# Patient Record
Sex: Female | Born: 1935
Health system: Southern US, Community
[De-identification: ages and names within clinical notes are randomized; demographics above are authoritative.]

## PROBLEM LIST (undated history)

## (undated) DIAGNOSIS — E119 Type 2 diabetes mellitus without complications: Secondary | ICD-10-CM

## (undated) DIAGNOSIS — I059 Rheumatic mitral valve disease, unspecified: Secondary | ICD-10-CM

## (undated) DIAGNOSIS — I5022 Chronic systolic (congestive) heart failure: Secondary | ICD-10-CM

## (undated) DIAGNOSIS — I10 Essential (primary) hypertension: Secondary | ICD-10-CM

## (undated) DIAGNOSIS — I214 Non-ST elevation (NSTEMI) myocardial infarction: Secondary | ICD-10-CM

## (undated) DIAGNOSIS — E785 Hyperlipidemia, unspecified: Secondary | ICD-10-CM

## (undated) DIAGNOSIS — I2510 Atherosclerotic heart disease of native coronary artery without angina pectoris: Secondary | ICD-10-CM

## (undated) HISTORY — DX: Essential (primary) hypertension: I10

## (undated) HISTORY — DX: Rheumatic mitral valve disease, unspecified: I05.9

## (undated) HISTORY — PX: MITRAL VALVE REPLACEMENT: SHX147

## (undated) HISTORY — PX: CARDIAC CATHETERIZATION: SHX172

## (undated) HISTORY — DX: Atherosclerotic heart disease of native coronary artery without angina pectoris: I25.10

## (undated) HISTORY — DX: Hyperlipidemia, unspecified: E78.5

## (undated) HISTORY — PX: CORONARY ARTERY BYPASS GRAFT: SHX141

---

## 2003-03-28 ENCOUNTER — Inpatient Hospital Stay (HOSPITAL_COMMUNITY): Admission: AD | Admit: 2003-03-28 | Discharge: 2003-04-09 | Payer: Self-pay | Admitting: *Deleted

## 2003-03-29 ENCOUNTER — Encounter (INDEPENDENT_AMBULATORY_CARE_PROVIDER_SITE_OTHER): Payer: Self-pay | Admitting: Cardiology

## 2003-03-30 ENCOUNTER — Encounter: Payer: Self-pay | Admitting: Cardiothoracic Surgery

## 2003-03-31 ENCOUNTER — Encounter (INDEPENDENT_AMBULATORY_CARE_PROVIDER_SITE_OTHER): Payer: Self-pay | Admitting: *Deleted

## 2003-03-31 ENCOUNTER — Encounter: Payer: Self-pay | Admitting: Cardiothoracic Surgery

## 2003-04-01 ENCOUNTER — Encounter: Payer: Self-pay | Admitting: Cardiothoracic Surgery

## 2003-04-02 ENCOUNTER — Encounter: Payer: Self-pay | Admitting: Cardiothoracic Surgery

## 2003-04-03 ENCOUNTER — Encounter: Payer: Self-pay | Admitting: Thoracic Surgery (Cardiothoracic Vascular Surgery)

## 2003-04-04 ENCOUNTER — Encounter: Payer: Self-pay | Admitting: Thoracic Surgery (Cardiothoracic Vascular Surgery)

## 2003-04-05 ENCOUNTER — Encounter: Payer: Self-pay | Admitting: Cardiothoracic Surgery

## 2003-12-15 ENCOUNTER — Inpatient Hospital Stay (HOSPITAL_COMMUNITY): Admission: EM | Admit: 2003-12-15 | Discharge: 2003-12-29 | Payer: Self-pay | Admitting: Emergency Medicine

## 2003-12-23 ENCOUNTER — Encounter (INDEPENDENT_AMBULATORY_CARE_PROVIDER_SITE_OTHER): Payer: Self-pay | Admitting: *Deleted

## 2004-12-20 IMAGING — US US ABDOMEN COMPLETE
1 series · 14 of 25 positions shown · non-contrast
Comparison: none

CLINICAL DATA: Possible cholecystitis.  
 ABDOMINAL ULTRASOUND 12/23/03 AT 4433 HOURS

[Series 1: unknown · 0.28mm/px · 14 of 58 slices shown]
[im 1/58]
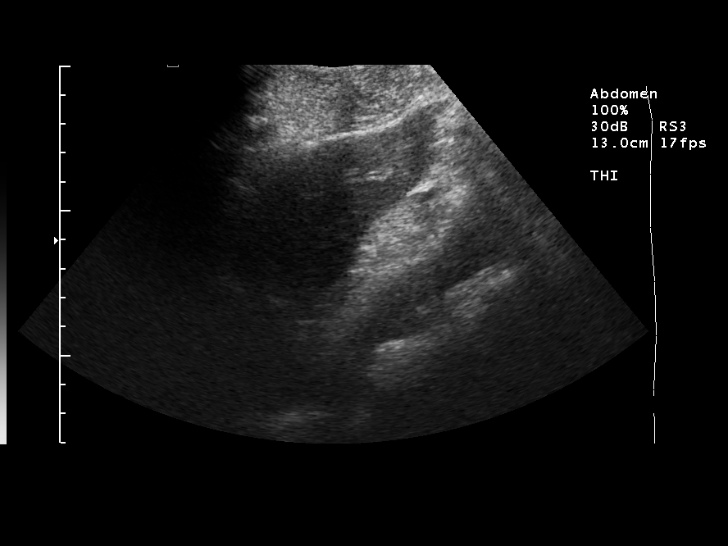
[im 5/58]
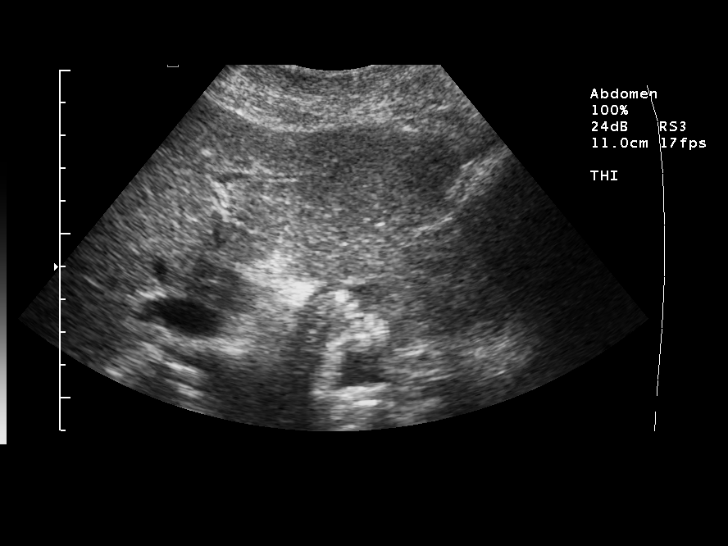
[im 10/58]
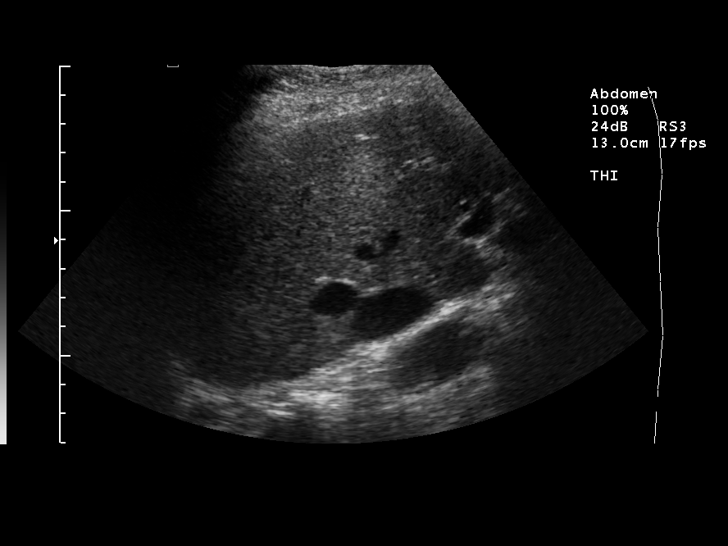
[im 15/58]
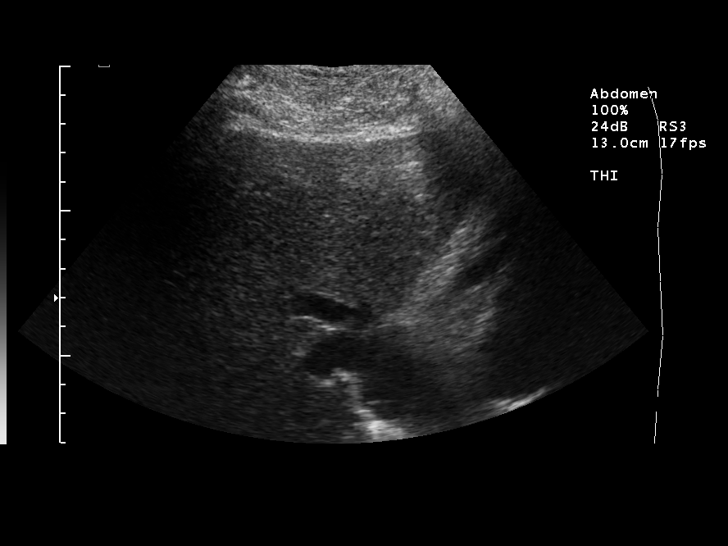
[im 20/58]
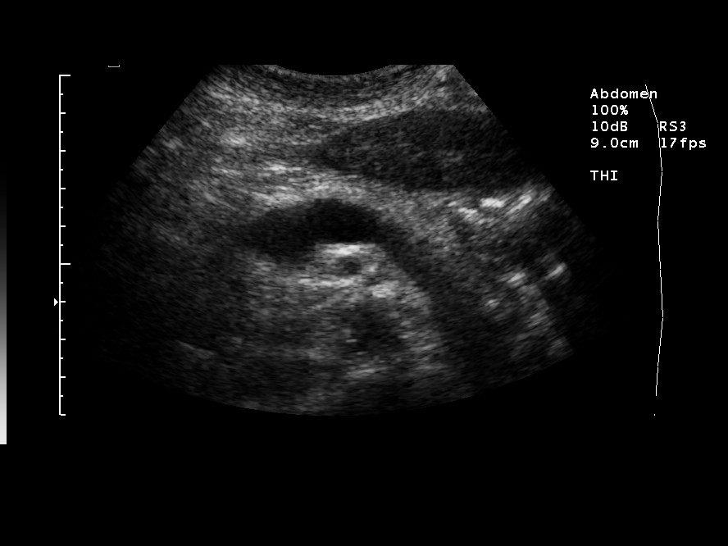
[im 22/58]
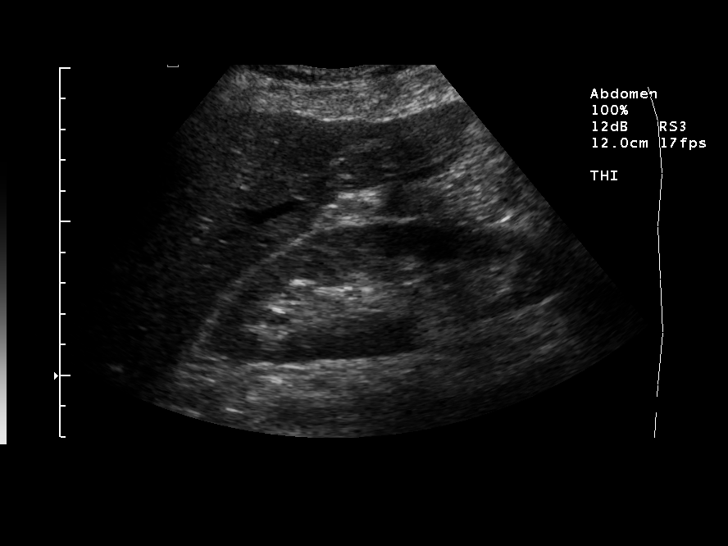
[im 27/58]
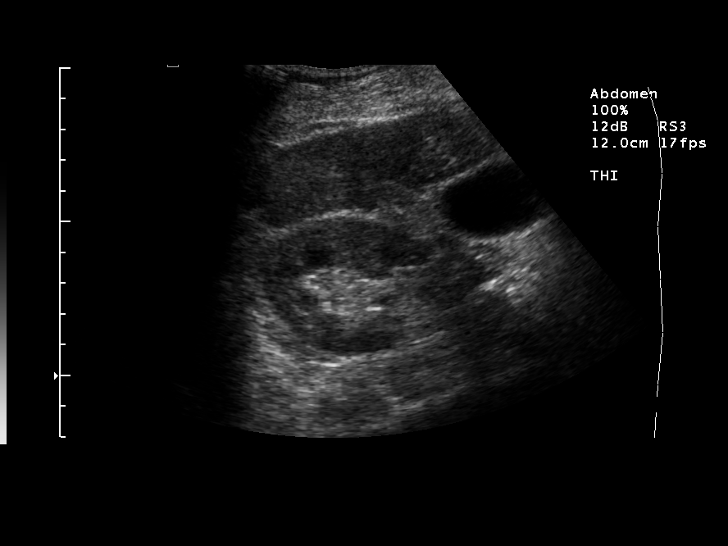
[im 31/58]
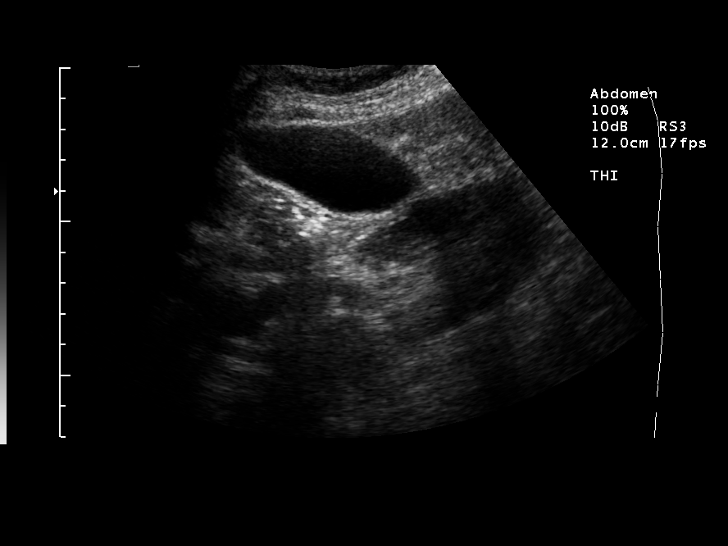
[im 36/58]
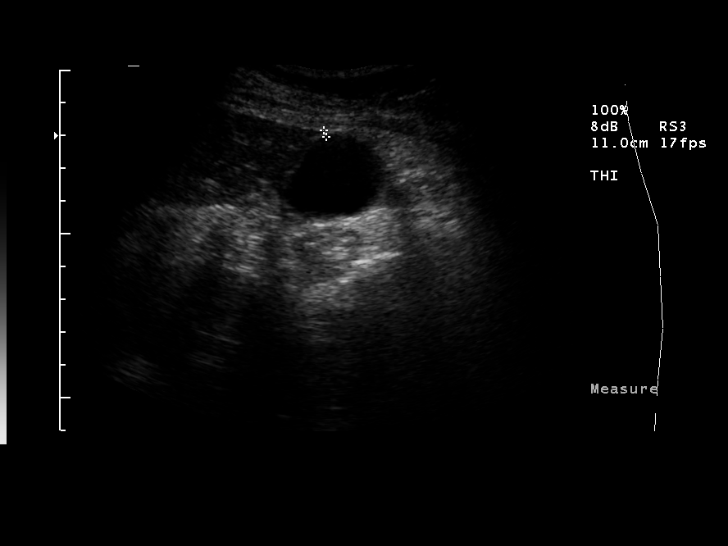
[im 39/58]
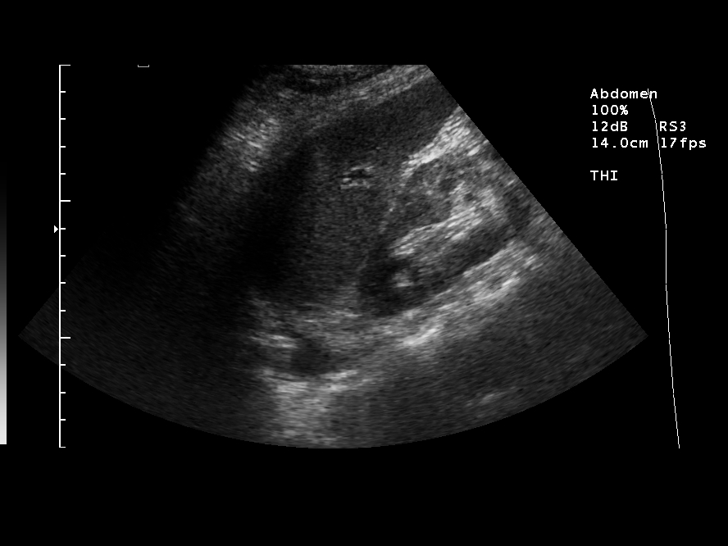
[im 43/58]
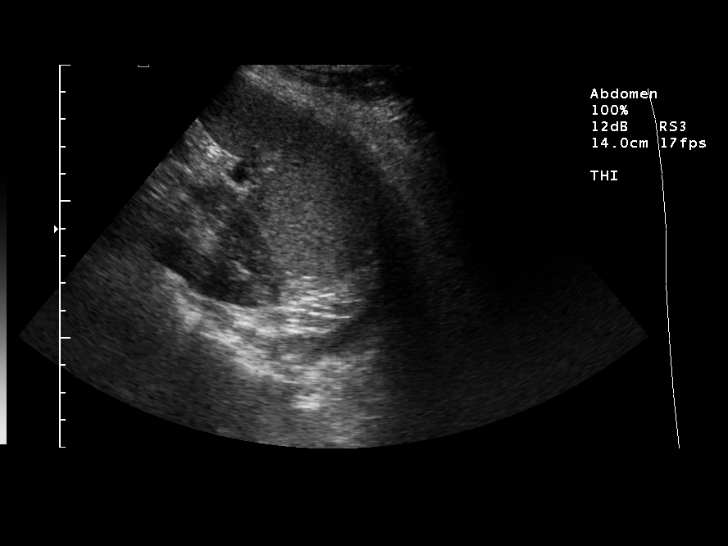
[im 48/58]
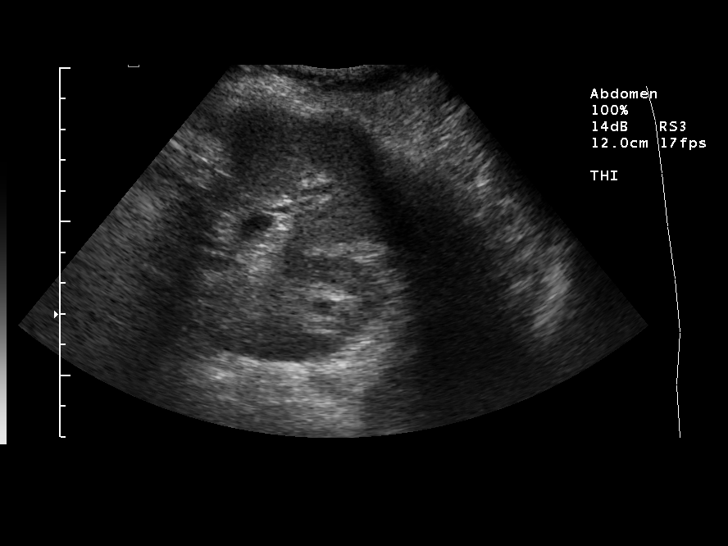
[im 53/58]
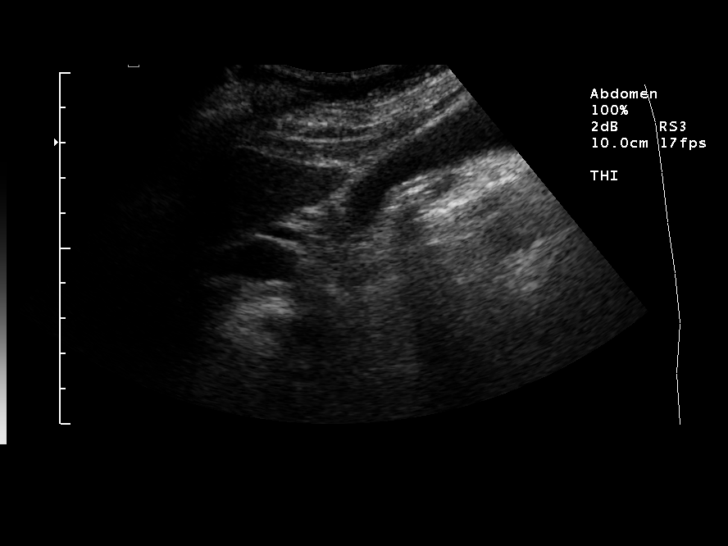
[im 58/58]
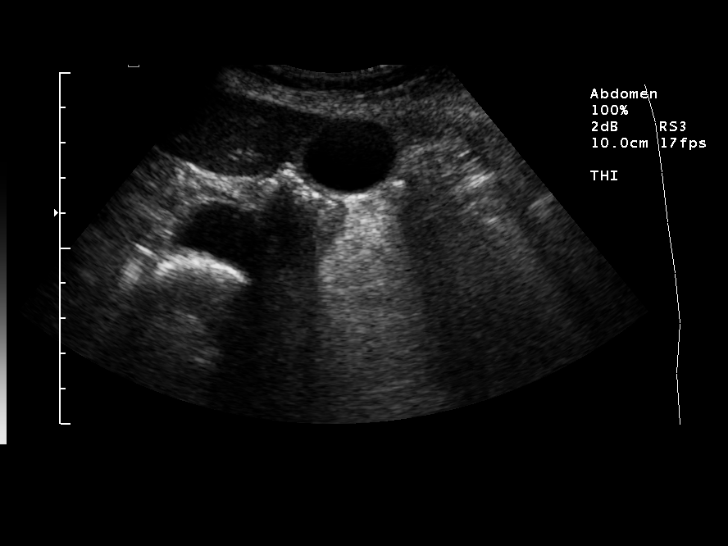

[14 of 25 positions shown; findings below may reference images not displayed]

FINDINGS: The gallbladder is within normal limits without gallstones, wall thickening or pericholecystic fluid.  The common bile duct is 5.0 mm in caliber.  No focal liver masses are seen.  Echogenicity is within normal limits.  
 The IVC is patent.  
 The pancreas is within normal limits. 
 The spleen is 10.0 cm in maximal dimension and is without focal mass effect. 
 The right and left kidneys are 11.3 and 10.7 cm in length respectively without hydronephrosis or mass effect.  
 Maximal aortic caliber is 2.1 cm.  
 Small bilateral effusions are present. 
 IMPRESSION
 No evidence of acute cholecystitis. 
 Bilateral pleural effusions. 
 Otherwise no evidence of acute intraabdominal pathology.

## 2009-09-18 ENCOUNTER — Encounter: Payer: Self-pay | Admitting: Cardiovascular Disease

## 2009-12-04 ENCOUNTER — Encounter: Payer: Self-pay | Admitting: Cardiovascular Disease

## 2009-12-27 ENCOUNTER — Encounter: Payer: Self-pay | Admitting: Cardiovascular Disease

## 2009-12-27 DIAGNOSIS — Z9889 Other specified postprocedural states: Secondary | ICD-10-CM | POA: Insufficient documentation

## 2009-12-29 ENCOUNTER — Encounter: Payer: Self-pay | Admitting: Internal Medicine

## 2009-12-29 ENCOUNTER — Encounter: Payer: Self-pay | Admitting: Cardiovascular Disease

## 2009-12-29 LAB — CONVERTED CEMR LAB: Prothrombin Time: 36.1 s

## 2010-01-03 ENCOUNTER — Encounter: Payer: Self-pay | Admitting: Cardiovascular Disease

## 2010-01-26 ENCOUNTER — Encounter: Payer: Self-pay | Admitting: Cardiovascular Disease

## 2010-01-29 ENCOUNTER — Encounter: Payer: Self-pay | Admitting: Cardiovascular Disease

## 2010-01-29 LAB — CONVERTED CEMR LAB: INR: 4.34

## 2010-02-05 ENCOUNTER — Encounter: Payer: Self-pay | Admitting: Cardiovascular Disease

## 2010-02-06 ENCOUNTER — Encounter: Payer: Self-pay | Admitting: Internal Medicine

## 2010-02-06 LAB — CONVERTED CEMR LAB: Prothrombin Time: 35 s

## 2010-03-08 ENCOUNTER — Encounter: Payer: Self-pay | Admitting: Cardiovascular Disease

## 2010-03-08 ENCOUNTER — Encounter: Payer: Self-pay | Admitting: Cardiology

## 2010-03-21 ENCOUNTER — Telehealth: Payer: Self-pay | Admitting: Cardiovascular Disease

## 2010-03-30 ENCOUNTER — Encounter: Payer: Self-pay | Admitting: Cardiovascular Disease

## 2010-04-05 ENCOUNTER — Encounter: Payer: Self-pay | Admitting: Cardiovascular Disease

## 2010-04-05 ENCOUNTER — Ambulatory Visit: Payer: Self-pay | Admitting: Cardiovascular Disease

## 2010-04-05 DIAGNOSIS — E785 Hyperlipidemia, unspecified: Secondary | ICD-10-CM | POA: Insufficient documentation

## 2010-04-05 DIAGNOSIS — I25810 Atherosclerosis of coronary artery bypass graft(s) without angina pectoris: Secondary | ICD-10-CM | POA: Insufficient documentation

## 2010-04-05 LAB — CONVERTED CEMR LAB: POC INR: 2.1

## 2010-04-06 ENCOUNTER — Encounter: Payer: Self-pay | Admitting: Cardiovascular Disease

## 2010-04-10 ENCOUNTER — Encounter: Payer: Self-pay | Admitting: Cardiovascular Disease

## 2010-05-24 ENCOUNTER — Telehealth: Payer: Self-pay | Admitting: Cardiovascular Disease

## 2010-10-04 ENCOUNTER — Ambulatory Visit: Payer: Self-pay

## 2010-10-30 ENCOUNTER — Other Ambulatory Visit: Payer: Self-pay | Admitting: Cardiovascular Disease

## 2010-11-05 ENCOUNTER — Encounter: Payer: Self-pay | Admitting: Cardiovascular Disease

## 2010-11-20 NOTE — Letter (Signed)
Summary: POA  POA   Imported By: Harlon Flor 01/03/2010 11:23:16  _____________________________________________________________________  External Attachment:    Type:   Image     Comment:   External Document

## 2010-11-20 NOTE — Medication Information (Signed)
Summary: Coumadin Clinic  Anticoagulant Therapy  Managed by: Charlena Cross, RN, BSN Referring MD: Cherlynn Polo MD: Azka Steger MD, Lirio Bach Indication 1: Mechanical Prosthetic Heart Valve (prevent systemic emboli) Lab Used: [facility] Site: [place] PT 36.1 INR RANGE 2.5-3.5  Dietary changes: no    Health status changes: no    Bleeding/hemorrhagic complications: no    Recent/future hospitalizations: no    Any changes in medication regimen? no    Recent/future dental: no  Any missed doses?: no       Is patient compliant with meds? yes       Anticoagulation Management History:      Her anticoagulation is being managed by telephone today.  Positive risk factors for bleeding include an age of 75 years or older.  The bleeding index is 'intermediate risk'.  Positive CHADS2 values include Age > 13 years old.  Today's INR is 3.35.  Prothrombin time is 36.1.  Anticoagulation responsible provider: Shakora Nordquist MD, Reuel Boom.    Anticoagulation Management Assessment/Plan:      The target INR is 2.5-3.5.  The next INR is due 01/26/2010.  Anticoagulation instructions were given to newphew.  Results were reviewed/authorized by Charlena Cross, RN, BSN.  She was notified by Charlena Cross, RN, BSN.         Current Anticoagulation Instructions: The patient is to continue with the same dose of coumadin.  This dosage includes: coumadin 4 mg daily with 4.5 mg on Mon and Fri 35

## 2010-11-20 NOTE — Miscellaneous (Signed)
Summary: inr order  Clinical Lists Changes  Problems: Added new problem of MITRAL VALVE REPLACEMENT, HX OF (ICD-V15.1) Orders: Added new Test order of T-Protime, Auto [phone]) - Signed

## 2010-11-20 NOTE — Assessment & Plan Note (Signed)
Summary: NEW PT   Visit Type:  Initial Consult Primary Provider:  Dr Dorann Lodge  CC:  follow up.  History of Present Illness: Amanda [name] is a very pleasant 77 -year-old woman with a past medical history he does coronary artery disease, bypass surgery in 2004 at [facility] with a mechanical mitral valve also with history of hypertension, hyperlipidemia who presents to reestablish care. She was last seen at [facility] on November 2010.  She reports that overall she is feeling well. She denies any significant shortness of breath or chest discomfort. She uses a cane to ambulate, has had no recent falls though states that her gait is somewhat unstable. She does have a little bit of swelling in her right ankle.  EKG shows normal sinus rhythm with rate 80 beats per minute, nonspecific ST changes in leads V4 through V6.  Preventive Screening-Counseling & Management  Alcohol-Tobacco     Smoking Status: current  Caffeine-Diet-Exercise     Does Patient Exercise: no      Drug Use:  no.    Current Medications (verified): 1)  Vytorin 10-40 Mg Tabs (Ezetimibe-Simvastatin) .... Take One Tablet By Mouth Dailyat Bedtime 2)  Lotrel 5-20 Mg Caps (Amlodipine Besy-Benazepril Hcl) .Marland Kitchen.. 1 Tab Daily 3)  Aspirin 81 Mg Tbec (Aspirin) .... Take One Tablet By Mouth Daily 4)  Metoprolol Succinate 50 Mg Xr24h-Tab (Metoprolol Succinate) .... Take 1 and A Half Tablet By Mouth Daily 5)  Warfarin Sodium 4 Mg Tabs (Warfarin Sodium) .... Use As Directed By Anticoagulation Clinic 6)  Phillips Milk of Magnesia 7.75 % Susp (Magnesium Hydroxide) .... As Needed 7)  Furosemide 20 Mg Tabs (Furosemide) .... Take One Tablet By Mouth Daily As Needed 8)  Loratadine 10 Mg Tabs (Loratadine) .... As Needed 9)  Metformin Hcl 500 Mg Tabs (Metformin Hcl) .... Once Daily  Allergies (verified): 1)  ! Pcn 2)  ! Codeine  Past History:  Past Medical History: Last updated: 01/02/2010 CAD --  bypass 2004 Mitral valve disease -- replacement 2004 hyperlipidemia HTN  Past Surgical History: Last updated: 01/02/2010 bypass -- 2004 mitral valve replacement -- 2004  Family History: Last updated: 04/05/2010 Family History of Coronary Artery Disease:  Family History of CVA or Stroke:  Family History of Diabetes:   Family History: Family History of Coronary Artery Disease:  Family History of CVA or Stroke:  Family History of Diabetes:   Social History: Retired  Widowed  Tobacco Use - Yes.  -- snuff Alcohol Use - no Regular Exercise - no Drug Use - no Smoking Status:  current Does Patient Exercise:  no Drug Use:  no  Review of Systems       The patient complains of difficulty walking.  The patient denies fever, weight loss, weight gain, vision loss, decreased hearing, hoarseness, chest pain, syncope, dyspnea on exertion, peripheral edema, prolonged cough, abdominal pain, incontinence, muscle weakness, depression, and enlarged lymph nodes.    Vital Signs:  Patient profile:   77 year old female Height:      64 inches Weight:      123 pounds BMI:     21.19 Pulse rate:   80 / minute BP sitting:   106 / 68  (left arm) Cuff size:   regular  Vitals Entered By: Hardin Negus, RMA (April 05, 2010 2:41 PM)  Physical Exam  General:  Well developed, well nourished, in no acute distress. Head:  normocephalic and atraumatic Neck:  Neck supple, no JVD.  No masses, thyromegaly or abnormal cervical nodes. Lungs:  Clear bilaterally to auscultation and percussion. Heart:  Non-displaced PMI, chest non-tender; regular rate and rhythm, S1, S2 with II/VI SEM LSB to axilla, no rubs or gallops. Carotid upstroke normal, no bruit.. Pedals normal pulses. No edema, no varicosities. Abdomen:  Bowel sounds positive; abdomen soft and non-tender without masses Msk:  Back normal, normal gait. Muscle strength and tone normal. Pulses:  pulses normal in all 4 extremities Extremities:  No  clubbing or cyanosis. Neurologic:  Alert and oriented x 3. Skin:  Intact without lesions or rashes. Psych:  Normal affect.   Impression & Recommendations:  Problem # 1:  MITRAL VALVE REPLACEMENT, HX OF (ICD-V15.1) mechanical mitral valve placed at the time of her bypass surgery in 2004 at [facility]. No new symptoms of shortness of breath or discomfort. Last echocardiogram was September 2009. At that time her mitral valve was well seated.  We have suggested that she have an echocardiogram done in December with a clinic visit on the same day to follow to save her trip.  Orders: EKG w/ Interpretation (93000) Echocardiogram (Echo)  Problem # 2:  CAD, ARTERY BYPASS GRAFT (ICD-414.04) History of coronary artery disease in 2004. No symptoms of angina at this time. Her cholesterol is well controlled with LDL less than 70.  Her updated medication list for this problem includes:    Lotrel 5-20 Mg Caps (Amlodipine besy-benazepril hcl) .Marland Kitchen... 1 tab daily    Aspirin 81 Mg Tbec (Aspirin) .Marland Kitchen... Take one tablet by mouth daily    Metoprolol Succinate 50 Mg Xr24h-tab (Metoprolol succinate) .Marland Kitchen... Take 1 and a half tablet by mouth daily    Warfarin Sodium 4 Mg Tabs (Warfarin sodium) ..... Use as directed by anticoagulation clinic  Problem # 3:  HYPERLIPIDEMIA-MIXED (ICD-272.4) Given the new restrictions on simvastatin dosing, we will decrease her Vytorin to 10/20 mg daily down from 10/40 mg daily.  Her updated medication list for this problem includes:    Vytorin 10-20 Mg Tabs (Ezetimibe-simvastatin) .Marland Kitchen... Take one tablet by mouth daily at bedtime  Patient Instructions: 1)  Your physician has recommended you make the following change in your medication: STOP Vytorin 10/40  START Vytroin 10/20 2)  Your physician has requested that you have an echocardiogram.  Echocardiography is a painless test that uses sound waves to create images of your heart. It provides your doctor with information about  the size and shape of your heart and how well your heart's chambers and valves are working.  This procedure takes approximately one hour. There are no restrictions for this procedure. 3)  Your physician wants you to follow-up in:  6 months  You will receive a reminder letter in the mail two months in advance. If you don't receive a letter, please call our office to schedule the follow-up appointment. Prescriptions: VYTORIN 10-20 MG TABS (EZETIMIBE-SIMVASTATIN) Take one tablet by mouth daily at bedtime  #30 x 6   Entered by:   Benedict Needy, RN   Authorized by:   Dossie Arbour MD   Signed by:   Benedict Needy, RN on 04/05/2010   Method used:   Print then Give to Patient   RxID:   [id number]   Appended Document: NEW PT Goal INR for her mechanical valve is 2.5-3.5. We have placed a request to see if Dr. Leonor Liv would be able to check her INR as she lives very close to his office and this would save her considerable driving time.

## 2010-11-20 NOTE — Medication Information (Signed)
Summary: Coumadin Clinic  Anticoagulant Therapy  Managed by: [facility] Referring MD: Cherlynn Polo MD: Anup Brigham Indication 1: Mechanical Prosthetic Heart Valve (prevent systemic emboli) Lab Used: [facility] Site: [place] PT 46.7 INR RANGE 2.5-3.5  Dietary changes: no    Health status changes: no    Bleeding/hemorrhagic complications: no    Recent/future hospitalizations: no    Any changes in medication regimen? no    Recent/future dental: no  Any missed doses?: no       Is patient compliant with meds? yes       Anticoagulation Management History:      Her anticoagulation is being managed by telephone today.  Positive risk factors for bleeding include an age of 75 years or older.  The bleeding index is 'intermediate risk'.  Positive CHADS2 values include Age > 58 years old.  Her last INR was 3.35 and today's INR is 4.34.  Prothrombin time is 46.7.  Anticoagulation responsible provider: Marlia Schewe.    Anticoagulation Management Assessment/Plan:      The patient's current anticoagulation dose is Warfarin sodium 2 mg tabs: Use as directed by Anticoagualtion Clinic, Warfarin sodium 2.5 mg tabs: Use as directed by Anticoagualtion Clinic.  The target INR is 2.5-3.5.  The next INR is due 02/12/2010.  Anticoagulation instructions were given to nephew.  Results were reviewed/authorized by [facility].  She was notified by KL.         Prior Anticoagulation Instructions: The patient is to continue with the same dose of coumadin.  This dosage includes: coumadin 4 mg daily with 4.5 mg on Mon and Fri  Current Anticoagulation Instructions: hold 2 days then coumadin 4 mg daily.  recheck in 10 days

## 2010-11-20 NOTE — Progress Notes (Signed)
Summary: REFILL  Phone Note Refill Request Message from:  Patient on May 24, 2010 1:06 PM  Refills Requested: Medication #1:  VYTORIN 10-20 MG TABS Take one tablet by mouth daily at bedtime [facility]- [phone] PHONE NUMBER  Initial call taken by: [place] Carbo,  May 24, 2010 1:07 PM     Appended Document: vytorin    Clinical Lists Changes  Medications: Rx of VYTORIN 10-20 MG TABS (EZETIMIBE-SIMVASTATIN) Take one tablet by mouth daily at bedtime;  #30 x 6;  Signed;  Entered by: Hardin Negus, RMA;  Authorized by: Dossie Arbour MD;  Method used: Electronically to [facility] [address], [place], [place]  [address], Ph: [phone], Fax: [phone]    Prescriptions: VYTORIN 10-20 MG TABS (EZETIMIBE-SIMVASTATIN) Take one tablet by mouth daily at bedtime  #30 x 6   Entered by:   Hardin Negus, RMA   Authorized by:   Dossie Arbour MD   Signed by:   Hardin Negus, RMA on 05/28/2010   Method used:   Electronically to        [facility] (retail)       [address]       [place], [place]  [address]       Ph: [phone]       Fax: [phone]   RxID:   [id number]

## 2010-11-20 NOTE — Medication Information (Signed)
Summary: Coumadin Clinic  Anticoagulant Therapy  Managed by: Charlena Cross, RN, BSN Referring MD: Cherlynn Polo MD: Bensimhon MD, Daniel Indication 1: Mechanical Prosthetic Heart Valve (prevent systemic emboli) Lab Used: [facility] Site: [place] PT 35.0 INR RANGE 2.5-3.5  Dietary changes: no    Health status changes: no    Bleeding/hemorrhagic complications: no    Recent/future hospitalizations: no    Any changes in medication regimen? no    Recent/future dental: no  Any missed doses?: no       Is patient compliant with meds? yes       Anticoagulation Management History:      Her anticoagulation is being managed by telephone today.  Positive risk factors for bleeding include an age of 75 years or older.  The bleeding index is 'intermediate risk'.  Positive CHADS2 values include Age > 71 years old.  Her last INR was 4.34 and today's INR is 3.25.  Prothrombin time is 35.0.  Anticoagulation responsible provider: Bensimhon MD, Reuel Boom.    Anticoagulation Management Assessment/Plan:      The patient's current anticoagulation dose is Warfarin sodium 2 mg tabs: Use as directed by Anticoagualtion Clinic, Warfarin sodium 2.5 mg tabs: Use as directed by Anticoagualtion Clinic.  The target INR is 2.5-3.5.  The next INR is due 03/06/2010.  Anticoagulation instructions were given to nephew.  Results were reviewed/authorized by Charlena Cross, RN, BSN.  She was notified by Charlena Cross, RN, BSN.         Prior Anticoagulation Instructions: hold 2 days then coumadin 4 mg daily.  recheck in 10 days  Current Anticoagulation Instructions: The patient is to continue with the same dose of coumadin.  This dosage includes: coumadin 4 mg daily

## 2010-11-20 NOTE — Medication Information (Signed)
Summary: Coumadin Clinic  Anticoagulant Therapy  Managed by: Inactive Referring MD: gollan PCP: Dr Dorann Lodge Supervising MD: Mariah Milling Indication 1: Mechanical Prosthetic Heart Valve (prevent systemic emboli) Lab Used: [facility] Site: [place] INR RANGE 2.5-3.5          Comments: Pt is now having coumadin monitored by her primary care MD Dr Leonor Liv. Records and order faxed on 04/05/10. Cloyde Reams RN  April 10, 2010 5:02 PM   Allergies: 1)  ! Pcn 2)  ! Codeine  Anticoagulation Management History:      Positive risk factors for bleeding include an age of 75 years or older.  The bleeding index is 'intermediate risk'.  Positive CHADS2 values include Age > 51 years old.  Her last INR was 3.25.  Anticoagulation responsible provider: gollan.    Anticoagulation Management Assessment/Plan:      The patient's current anticoagulation dose is Warfarin sodium 4 mg tabs: Use as directed by Anticoagulation Clinic.  The target INR is 2.5-3.5.  The next INR is due 04/12/2010.  Anticoagulation instructions were given to nephew.  Results were reviewed/authorized by Inactive.         Prior Anticoagulation Instructions: INR 2.1  Take 6mg  today, then start taking 4mg  daily except 6mg  on Mondays.  Recheck in 1 week.

## 2010-11-20 NOTE — Letter (Signed)
Summary: Medical Record Release  Medical Record Release   Imported By: Harlon Flor 01/12/2010 08:02:19  _____________________________________________________________________  External Attachment:    Type:   Image     Comment:   External Document

## 2010-11-20 NOTE — Progress Notes (Signed)
Summary: LAB ORDER  Phone Note Call from Patient Call back at [phone]   Caller: MIKE MOODY (NEPHEW) Call For: [facility] Summary of Call: NEEDS LABS FOR CHOLESTEROL-HAS NOT BEEN DRAWN SINCE LAST YEAR-WOULD LIKE FOR A LAB ORDER TO BE SENT TO [facility] AT FAX #[phone] ATTN: LAB Initial call taken by: Harlon Flor,  March 21, 2010 4:27 PM     Appended Document: LAB ORDER OK to send order for lab  Appended Document: LAB ORDER lab order faxed to [facility] attempted to call pt LMOM.   Appended Document: LAB ORDER Called Nephew Mallie Mussel informed him that order was faxedt [facility].  Pt has appointment next Thursday.

## 2010-11-20 NOTE — Medication Information (Signed)
Summary: Coumadin Clinic  Anticoagulant Therapy  Managed by: Cloyde Reams, RN, BSN Referring MD: Cherlynn Polo MD: Mariah Milling Indication 1: Mechanical Prosthetic Heart Valve (prevent systemic emboli) Lab Used: [facility] Site: [place] INR POC 2.1 INR RANGE 2.5-3.5    Bleeding/hemorrhagic complications: no     Any changes in medication regimen? no     Any missed doses?: no       Is patient compliant with meds? yes      Comments: Missed 1 dose last week.    Allergies: 1)  ! Pcn 2)  ! Codeine  Anticoagulation Management History:      The patient is taking warfarin and comes in today for a routine follow up visit.  Positive risk factors for bleeding include an age of 37 years or older.  The bleeding index is 'intermediate risk'.  Positive CHADS2 values include Age > 91 years old.  Her last INR was 3.25.  Anticoagulation responsible provider: Amairani Shuey.  INR POC: 2.1.    Anticoagulation Management Assessment/Plan:      The patient's current anticoagulation dose is Warfarin sodium 4 mg tabs: Use as directed by Anticoagulation Clinic.  The target INR is 2.5-3.5.  The next INR is due 04/12/2010.  Anticoagulation instructions were given to nephew.  Results were reviewed/authorized by Cloyde Reams, RN, BSN.  She was notified by Cloyde Reams RN.         Prior Anticoagulation Instructions: INR 1.66  Called spoke with pt's nephew, advised to have pt take 6mg  today then resume same dosage 4mg  daily.  Recheck in 2 weeks.    Current Anticoagulation Instructions: INR 2.1  Take 6mg  today, then start taking 4mg  daily except 6mg  on Mondays.  Recheck in 1 week.

## 2010-11-20 NOTE — Letter (Signed)
Summary: PHI  PHI   Imported By: Harlon Flor 04/06/2010 09:05:31  _____________________________________________________________________  External Attachment:    Type:   Image     Comment:   External Document

## 2010-11-22 ENCOUNTER — Encounter: Payer: Self-pay | Admitting: Cardiovascular Disease

## 2010-11-22 ENCOUNTER — Ambulatory Visit: Admit: 2010-11-22 | Payer: Self-pay | Admitting: Cardiovascular Disease

## 2010-11-22 ENCOUNTER — Ambulatory Visit (INDEPENDENT_AMBULATORY_CARE_PROVIDER_SITE_OTHER): Payer: Medicare Other | Admitting: Cardiovascular Disease

## 2010-11-22 DIAGNOSIS — I2510 Atherosclerotic heart disease of native coronary artery without angina pectoris: Secondary | ICD-10-CM

## 2010-11-22 DIAGNOSIS — I059 Rheumatic mitral valve disease, unspecified: Secondary | ICD-10-CM

## 2010-11-22 NOTE — Letter (Signed)
Summary: [employer], [employer] at [facility] [address]   [place], [place] [address]   Phone: [phone]  Fax: [phone]        November 05, 2010 MRN: [id number]    [facility] [address] [place], [place]  [address]    Dear Ms. AXELROD,   Dr. Mariah Milling has reveiwed your echo, and the results are normal functioning mitral prosthetic valve.      Sincerely,  Lanny Hurst RN

## 2010-11-28 NOTE — Assessment & Plan Note (Signed)
Summary: F/U 6 MONTHS/SAB   Vital Signs:  Patient profile:   77 year old female Height:      64 inches Weight:      122.25 pounds BMI:     21.06 Pulse rate:   73 / minute BP sitting:   147 / 82  (left arm) Cuff size:   regular  Vitals Entered By: Lysbeth Galas CMA (November 22, 2010 11:49 AM)  Visit Type:  Follow-up Primary Provider:  Dr Dorann Lodge  CC:  "doing well" denies chest pain and SOB.Amanda [name]  History of Present Illness:  very pleasant 30 -year-old woman with a past medical history of coronary artery disease, bypass surgery in 2004 at [facility] with a mechanical mitral valve also with history of hypertension, hyperlipidemia who presents for routine followup.  She reports that overall she is feeling well. She denies any significant shortness of breath or chest discomfort. She uses a cane to ambulate, has had no recent falls though states that her gait is somewhat unstable. She does have a little bit of swelling in her right ankle. she is currently living with family and has to walk up stairs here she does not do a regular exercise program. She denies any weakness in her legs.  EKG shows normal sinus rhythm with rate 73 beats per minute, nonspecific ST changes in leads V4 through V6.  Current Medications (verified): 1)  Vytorin 10-20 Mg Tabs (Ezetimibe-Simvastatin) .... Take One Tablet By Mouth Daily At Bedtime 2)  Lotrel 5-20 Mg Caps (Amlodipine Besy-Benazepril Hcl) .Amanda [name].. 1 Tab Daily 3)  Aspirin 81 Mg Tbec (Aspirin) .... Take One Tablet By Mouth Daily 4)  Metoprolol Succinate 50 Mg Xr24h-Tab (Metoprolol Succinate) .... Take 1 and A Half Tablet By Mouth Daily 5)  Warfarin Sodium 4 Mg Tabs (Warfarin Sodium) .... Use As Directed By Anticoagulation Clinic 6)  Phillips Milk of Magnesia 7.75 % Susp (Magnesium Hydroxide) .... As Needed 7)  Metformin Hcl 500 Mg Tabs (Metformin Hcl) .... Once Daily 8)  Amlodipine Besy-Benazepril Hcl 5-20 Mg Caps (Amlodipine Besy-Benazepril Hcl)  .Amanda [name].. 1 Tablet Once Daily 9)  Warfarin Sodium 4 Mg Tabs (Warfarin Sodium) .... As Directed, Dr. Carolynn Serve  Allergies (verified): 1)  ! Pcn 2)  ! Codeine  Past History:  Past Medical History: Last updated: 01/02/2010 CAD -- bypass 2004 Mitral valve disease -- replacement 2004 hyperlipidemia HTN  Past Surgical History: Last updated: 01/02/2010 bypass -- 2004 mitral valve replacement -- 2004  Family History: Last updated: 04/05/2010 Family History of Coronary Artery Disease:  Family History of CVA or Stroke:  Family History of Diabetes:   Social History: Last updated: 04/05/2010 Retired  Widowed  Tobacco Use - Yes.  -- snuff Alcohol Use - no Regular Exercise - no Drug Use - no  Risk Factors: Exercise: no (04/05/2010)  Risk Factors: Smoking Status: current (04/05/2010)  Review of Systems  The patient denies fever, weight loss, weight gain, vision loss, decreased hearing, hoarseness, chest pain, syncope, dyspnea on exertion, peripheral edema, prolonged cough, abdominal pain, incontinence, muscle weakness, depression, and enlarged lymph nodes.    Physical Exam  General:  Well developed, well nourished, in no acute distress. Head:  normocephalic and atraumatic Neck:  Neck supple, no JVD. No masses, thyromegaly or abnormal cervical nodes. Lungs:  Clear bilaterally to auscultation and percussion. Heart:  Non-displaced PMI, chest non-tender; regular rate and rhythm, S1, S2 with II/VI SEM LSB to axilla, no rubs or gallops. Carotid upstroke normal, no bruit.. Pedals  normal pulses. Trace right ankle edema, no varicosities. Abdomen:  Bowel sounds positive; abdomen soft and non-tender without masses Msk:  Back normal, normal gait. Muscle strength and tone normal. Pulses:  pulses normal in all 4 extremities Extremities:  No clubbing or cyanosis. Neurologic:  Alert and oriented x 3. Skin:  Intact without lesions or rashes. Psych:  Normal affect.   Impression &  Recommendations:  Problem # 1:  CAD, ARTERY BYPASS GRAFT (ICD-414.04) she is currently stable, no active symptoms of angina. We have not ordered any testing at this time. We have encouraged her to increase her exercise. Continue aggressive medical management.  Her updated medication list for this problem includes:    Lotrel 5-20 Mg Caps (Amlodipine besy-benazepril hcl) .Amanda [name]... 1 tab daily    Aspirin 81 Mg Tbec (Aspirin) .Amanda [name]... Take one tablet by mouth daily    Metoprolol Succinate 50 Mg Xr24h-tab (Metoprolol succinate) .Amanda [name]... Take 1 and a half tablet by mouth daily    Warfarin Sodium 4 Mg Tabs (Warfarin sodium) ..... Use as directed by anticoagulation clinic    Amlodipine Besy-benazepril Hcl 5-20 Mg Caps (Amlodipine besy-benazepril hcl) .Amanda [name]... 1 tablet once daily    Warfarin Sodium 4 Mg Tabs (Warfarin sodium) .Amanda [name]... As directed, dr. Leonor Liv follows  Problem # 2:  HYPERLIPIDEMIA-MIXED (ICD-272.4) Cholesterol is at goal. No further medication change.  Her updated medication list for this problem includes:    Vytorin 10-20 Mg Tabs (Ezetimibe-simvastatin) .Amanda [name]... Take one tablet by mouth daily at bedtime  Orders: T-Lipid Profile ([id number]) T-Hepatic Function ([phone])  Problem # 3:  MITRAL VALVE REPLACEMENT, HX OF (ICD-V15.1) Echocardiogram done last year showed well seated valve with no problems, normal systolic function. She currently takes warfarin 4 mg 5 days a week, 6 mg 2 days week  Orders: EKG w/ Interpretation (93000)  Patient Instructions: 1)  Your physician recommends that you schedule a follow-up appointment in: 6 months 2)  Your physician recommends that you return for a FASTING lipid profile: (liver/lipid) to be done at [facility] in June 2012. 3)  Your physician recommends that you continue on your current medications as directed. Please refer to the Current Medication list given to you today.   Orders Added: 1)  EKG w/ Interpretation [93000] 2)  T-Lipid Profile  [[id number]] 3)  T-Hepatic Function [[id number]]

## 2011-03-08 NOTE — Discharge Summary (Signed)
Amanda [name], Amanda [name]                          ACCOUNT NO.:  [id number]   MEDICAL RECORD NO.:  [id number]                   PATIENT TYPE:  INP   LOCATION:  2019                                 FACILITY:  [facility]   PHYSICIAN:  Kerin Perna, M.D.               DATE OF BIRTH:  07-08-1925   DATE OF ADMISSION:  12/15/2003  DATE OF DISCHARGE:  12/29/2003                                 DISCHARGE SUMMARY   ADMISSION DIAGNOSES:  Right thigh cellulitis/abscess.   PAST MEDICAL HISTORY:  1. Coronary artery disease and severe mitral regurgitation, status post     coronary artery bypass grafting and mitral valve replacement on March 31, 2003, by Kerin Perna, M.D.  2. Hypertension.  3. Hyperlipidemia.  4. Postoperative atrial fibrillation in sinus rhythm on admission.   ALLERGIES:  PENICILLIN causes swelling.   DISCHARGE DIAGNOSES:  1. Right thigh abscess, moderate Staphylococcus species coagulase negative.     Status post incision and drainage and placement of VAC dressing.  2. Ischemic colitis, resolved.   HISTORY OF PRESENT ILLNESS:  The patient is a 77 year old Caucasian female.  She was last seen in the CVTS office in August of 2004 and was discharged to  Dr. Zenaida Niece Trigt's care at that time.  Over the 24 to 48 hours prior to  admission, she had gradual onset of pain and swelling over her right leg  saphenectomy incision site.  She had previous similar discomfort in this  site back in September and was treated with a round dose of antibiotics.  She then noticed significant improvement.  She has had intermittent pain in  that leg since that time.  The morning of admission, her leg pain had  advanced to where she could not put pressure on her leg to walk.  She was  evaluated in the emergency department by Dr. Donata Clay who recommended  admission to the hospital for IV antibiotic therapy and probable incision  and drainage of her right thigh wound.  The patient agreed with this  plan.   HOSPITAL COURSE:  On December 15, 2003, the patient was admitted to [facility] under the care of Dr. Donata Clay.  IV antibiotic therapy was  initiated with Vancomycin.  Avelox was added on February 26.  Coumadin was  discontinued and she was changed to heparin anticoagulation therapy in  anticipation of surgery.  On February 25, a CT scan of her right thigh was  performed to rule out an abscess.  CT scan revealed a 3 x 3.5 cm fluid  collection in the medial aspect of the right thigh, thought to likely  represent an abscess.  Dr. Donata Clay discussed the findings with the patient  and recommended proceeding with incision and drainage and she agreed with  this plan.  She remained in the hospital on heparin awaiting surgery.   On February 28, the  patient underwent incision and drainage of right thigh  abscess with a VAC application.  The fluid drained from the abscess and was  sent for culture and sensitivity.  The patient tolerated the procedure well  and was transferred in stable condition to the SICU.  She was extubated at  the end of surgery, she awoke from anesthesia neurologically intact.  She  remained hemodynamically stable in the immediate postoperative period.  Intraoperative cultures were returned initially as gram positive cocci and  final cultures were returned as moderate Staphylococcus species coagulase  negative.  Vancomycin was discontinued on March 3.  Avelox was discontinued  on March 1.   The patient's postoperative course was marked by an episode of ischemic  colitis.  The evening of postoperative day #2, she had some bloody stools.  These persisted the next morning.  She also experienced cramping abdominal  pain. No nausea.  There was continued frank blood with her stool.  C.difficile sample was sent.  KUB of her abdomen was negative.  Flagyl p.o.  was started pending C.difficile results.  Her symptoms continued and by  postoperative day #3, she had  significant lower abdominal tenderness.  Dr.  Donata Clay recommended proceeding with a CT scan of the abdomen and pelvis  and also requested a GI consultation.  CT scan results were consistent with  ischemic colitis.  Her diet was reduced to clear liquids only.  TNA was  started via PICC line.  GI service recommended no further antibiotics and  Flagyl was discontinued.   On December 23, 2003, a two-dimensional echocardiogram was performed. There was  no diagnostic evidence for a left ventricular thrombus. The mitral valve was  well seated.  No mass was noted on the valve.   Over the next several days, the patient's general condition improved.  Bloody stools ceased and her abdominal examination was benign by the morning  of December 25, 2003.  Her diet was slowly advanced to a low residue diet over  the next 48 hours.  She tolerated her low residue diet well.  Her TNA was  discontinued on March 8.   Throughout the episode of her ischemic colitis, the patient's right thigh  wound was stable.  VAC dressings were initiated currently on a  Monday/Wednesday/Friday schedule. The wound remains clean with 100%  granulation tissue at the base.  Arrangements have been made for home [facility]  therapy and the equipment has been delivered to her hospital room.   This morning, December 28, 2003, postoperative day #9 after her I&D of her right  thigh, her vital signs are stable with blood pressure 123/53.  She is  afebrile and her room air saturation is 99% on room air. She feels very well  and she is without complaint. Her heart is maintaining normal sinus rhythm  at 69 beats per minute. Her lungs are clear. She has no GI complaints and  she is tolerating her low residue diet.  She has had bowel movement without  bleeding.  Her urine output is adequate. Her right thigh is with the VAC  dressing intact.  It was changed today and continues to have good granulation tissue at the base and the periwound tissue is intact.  The home  VAC was placed today.  She has been ambulating in the hall with a rolling  walker.  The patient continues to progress and her colitis is resolved.  If  she continues in this manner, she will be discharged home tomorrow on  December 29, 2003.   LABORATORY DATA:  March 7 CBC; white blood cell count 8.5, hemoglobin 12.0,  hematocrit 34.4, platelets 331.  Chemistries; sodium 136, potassium 3.9, BUN  10, creatinine 0.7, glucose 155.  March 9, PT 16.4, INR 1.5.   CONDITION ON DISCHARGE:  Improved.   DISCHARGE MEDICATIONS:  1. Aspirin 81 mg daily.  2. Lipitor 40 mg q.h.s.  3. Amiodarone 200 mg daily.  4. Toprol XL 25 mg daily. This is a new dose for her.  5. Zestril 20 mg daily.  6. Coumadin dose to be determined at discharge.  7. She may have Tylenol 325 mg one to two p.o. q.4h p.r.n.   ACTIVITY:  She is to continue her breathing exercises and daily walking with  her walker.   DIET:  Should continue to be a low residue diet until seen by Dr. Donata Clay.   WOUND CARE:  Home health nurse will visit and change her VAC dressing  continuing on a Monday/Wednesday/Friday schedule.  First visit will be  Friday, March 11.  Home health nurse will also draw PT and INR on Friday,  December 30, 2003, results to be called to [facility]. They have been following her Coumadin since her  surgery.   FOLLOW UP:  Dr. Donata Clay would like to see the patient at the CVTS Office  on Friday, March 18, at 1:30 p.m.      Toribio Harbour, N.P.                  Kerin Perna, M.D.    CTK/MEDQ  D:  12/28/2003  T:  12/30/2003  Job:  [id number]   cc:   Darlin Priestly, M.D.  [address], [address]  [place]  [place] [address]  Fax: [phone]   Dorann Lodge  [address]  [place]  [place] [address]  Fax: [phone]

## 2011-03-08 NOTE — H&P (Signed)
Amanda [name], CAKE                          ACCOUNT NO.:  [id number]   MEDICAL RECORD NO.:  [id number]                   PATIENT TYPE:  EMS   LOCATION:  MAJO                                 FACILITY:  [facility]   PHYSICIAN:  Amanda [name], M.D.               DATE OF BIRTH:  [birth year]   DATE OF ADMISSION:  12/15/2003  DATE OF DISCHARGE:                                HISTORY & PHYSICAL   CHIEF COMPLAINT:  Right leg pain and swelling.   HISTORY OF PRESENT ILLNESS:  The patient is a 77 year old white female who  is status post coronary artery bypass grafting as well as mitral valve  replacement in June of 2004 by Amanda [name], M.D.  She was last seen in  our office in August of 2004 and was discharged from our care at that time.  She has since been followed by Darlin Priestly, M.D. and Dorann Lodge, M.D.  Over the past 24 to 48 hours, she has had gradual onset of pain and swelling  over her right leg saphenectomy incision site.  She states that the pain is  localized to that area and has worsened keeping her awake last night.  She  has gotten to the point today where she cannot put pressure on her leg to  walk.  She had previously had similar discomfort in the site back around  September and at that time was placed on p.o. antibiotics by the nurse  practitioner at Dr. Mikey Bussing office.  She states that she did not notice  significant improvement, although, that episode was not nearly as severe.  She has had intermittent pain in the leg since that time around the incision  site, but nothing to this degree.  She was also recently treated in the past  several weeks for a sinus infection with Biaxin and states that her leg  symptoms did not improve on that antibiotic either.  She has been having  subjective fevers and chills at home.  She has had no other signs or  symptoms of infection of any type such as cough, dysuria, nausea and  vomiting.  Her nephew brought her into the emergency  department today for  further evaluation and treatment.   PAST MEDICAL HISTORY:  1. History of coronary artery disease.  2. History of severe mitral regurgitation.  3. Hypertension.  4. Hyperlipidemia.  5. Postoperative atrial fibrillation currently in sinus rhythm.   She denies history of diabetes mellitus, COPD, strokes, cancer, or bleeding  disorders.   PAST SURGICAL HISTORY:  CABG x3 (left internal mammary artery to the LAD,  saphenous vein graft to the circumflex, saphenous vein graft to the right  coronary artery), mitral valve replacement with 27 mm ATS mechanical valve  all performed on March 31, 2003, by Dr. Donata [name].   CURRENT MEDICATIONS:  1. Aspirin 81 mg daily.  2. Lipitor 40 mg q.h.s.  3.  Amiodarone 200 mg daily.  4. Toprol XL 50 mg 1-1/2 tablets daily.  5. Zestril 20 mg daily.  6. Coumadin 2.5 mg daily except 5 mg on Wednesday.  Her most recent INR was     two weeks ago and she was told that she did not need to return for follow-     up for one month. She does not know what her lab value was at that time.   ALLERGIES:  PENICILLIN causes swelling.   FAMILY HISTORY:  Her mother died at age 29 of a myocardial infarction and  also had diabetes mellitus.  Her father died at age 36 of a CVA.  She has  four brothers, three of whom died of coronary artery disease.  One also died  of cancer.  She has two sisters, one who died of diabetes mellitus and one  who died of a CVA.   SOCIAL HISTORY:  She is widowed and has no children.  She was brought to the  emergency room today by her nephew and great nephew.  She resides by herself  in [place], [place].  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  She denies any alcohol or smoking. She does admit to  occasional snuff use.   REVIEW OF SYSTEMS:  See history of present illness for pertinent positives  and negatives.  Also she  wears glasses.  She has a history of atrial  fibrillation and converted chemically to sinus rhythm.  She is followed by  Dr. Mikey Bussing office.  She has remained in sinus rhythm since discharge from  the hospital.  She had an episode of sinusitis about two weeks and was  treated with antibiotics and recovered without problems.  She denies weight  loss, syncope, TIA symptoms, amaurosis fugax, dysphagia, visual changes,  chest pain, heart palpitations, shortness of breath, dyspnea on exertion,  orthopnea, paroxysmal nocturnal dyspnea, abdominal pain, nausea, vomiting,  diarrhea, constipation, cough, sputum production, hematemesis, hematochezia,  melena, nocturia, hematuria, dysuria, lower extremity edema, anxiety,  depression, and intolerance to heat or cold.   PHYSICAL EXAMINATION:  VITAL SIGNS:  Blood pressure 117/61, heart rate 81  and regular, respirations 16 and unlabored, temperature 99, O2 saturations  96% on room air.  GENERAL:  This is an elderly white female who is currently in no acute  distress.  HEENT:  Normocephalic and atraumatic.  Pupils equal, round, and reactive to  light.  Extraocular movements intact.  Examination of the external ears and  nose reveal no abnormalities.  Oropharynx is clear with moist mucous  membranes.  NECK:  Supple without lymphadenopathy, thyromegaly, or carotid bruits.  HEART:  Regular rate and rhythm without murmurs, rubs, or gallops.  She has  a well-healed median sternotomy scar.  LUNGS:  Clear to auscultation.  ABDOMEN:  Soft, nontender, and nondistended with active bowel sounds in all  quadrants.  No masses or hepatosplenomegaly.  EXTREMITIES:  No cyanosis, clubbing, or edema.  She has 2+ palpable femoral  and dorsalis pedis pulses bilaterally with 1+ posterior tibial pulses  bilaterally.  Examination of the right lower extremity shows a well-healed  saphenectomy site which starts just above the knee and extends across the knee to just below  the knee.  The area in the proximal most part of the  incision which is in the thigh is mildly erythematous and very tender to  palpation.  There is an around a 2 cm area of swelling and firmness around  the site and the area is warm to touch. There is no specific fluctuance.  NEUROLOGY:  Cranial nerves II-XII grossly intact.   LABORATORY DATA:  Hemoglobin 13.8, hematocrit 40.7, white count 12.4,  platelets 265.  Differential is within normal limits.  Sodium 137, potassium  4.0, chloride 104, CO2 27, BUN 12, creatinine 0.9, glucose 116.  PTT 45, PT  26.6, INR 3.7, urinalysis negative.  Blood cultures pending.  X-ray of the  right knee shows a 2.5 x 5 cm area of fluid collection, questionable  hematoma versus abscess.  No effusion or fracture.   ASSESSMENT:  This is a 77 year old white female who presents with a right  lower extremity saphenectomy site cellulitis/abscess.  She has failed  treatment with two rounds of p.o. antibiotics and her symptoms seem to be  worsening, therefore, she will be admitted today for IV antibiotic therapy  and further monitoring.      Coral Ceo, P.A.                        Amanda [name], M.D.    GC/MEDQ  D:  12/15/2003  T:  12/15/2003  Job:  [id number]   cc:   Darlin Priestly, M.D.  [phone] [address], [address]  [place]  [place] [address]  Fax: [phone]   Dorann Lodge  [address]  [place]  [place] [address]  Fax: [phone]

## 2011-03-08 NOTE — Op Note (Signed)
[name], Amanda [name]                          ACCOUNT NO.:  [id number]   MEDICAL RECORD NO.:  [id number]                   PATIENT TYPE:  INP   LOCATION:  3729                                 FACILITY:  [facility]   PHYSICIAN:  Kerin Perna III, M.D.           DATE OF BIRTH:  05-07-1925   DATE OF PROCEDURE:  12/19/2003  DATE OF DISCHARGE:                                 OPERATIVE REPORT   OPERATION:  Incision and drainage of right thigh abscess, with application  of wound VAC system.   PREOPERATIVE DIAGNOSIS:  Abscess of right thigh Endo-vein tunnel, status  post coronary artery bypass graft surgery.   POSTOPERATIVE DIAGNOSIS:  Abscess of right thigh Endo-vein tunnel, status  post coronary artery bypass graft surgery.   SURGEON:  Kerin Perna, M.D.   ANESTHESIA:  General.   INDICATIONS FOR PROCEDURE:  The patient is a 77 year old female who eight  months ago had undergone a coronary artery bypass graft surgery x3 with  mitral valve replacement for ischemic cardiomyopathy and mitral  regurgitation.  She was seen back in her physician's office with redness and  swelling at the right knee at the site of the saphenous vein Endo-vascular  harvest tunnel.  A course of oral antibiotics did not resolve the process,  and she was admitted for IV antibiotics.  A CT scan of the extremity  demonstrated a 3.0 cm collection of fluid.  Because of her prosthetic mitral  valve, incision and drainage and debridement, rather than continued  antibiotics was felt to be the best therapeutic course.  The procedure was  discussed with the patient, including the benefits and risks, and she agreed  to proceed with the operation prior to surgery.  Her Coumadin had been held,  and her INR was 1.4 on the day of surgery.   DESCRIPTION OF PROCEDURE:  The patient was brought to the operating room and  placed supine on the operating room table where a general anesthesia was  induced.  The right leg was  prepped and draped as a sterile field.  The  incision was made over the previous knee incision and was carried down  through some indurated soft tissue.  A cavity measuring 3.0 cm x 4.0 cm was  encountered, with some milky purulent fluid.  This was sent for culture.  The wound was drained and irrigated and debrided of indurated tissue.  Bleeding was controlled with electrocautery.  A  wound VAC system was then  applied and connected to 125 mmHg suction.  The patient was then reversed from anesthesia, and returned to the recovery  room in stable condition.  The blood loss was minimal.  Mikey Bussing, M.D.    PV/MEDQ  D:  12/19/2003  T:  12/19/2003  Job:  [id number]   cc:   [facility]

## 2011-03-08 NOTE — Consult Note (Signed)
Amanda [name], Amanda [name]                          ACCOUNT NO.:  [id number]   MEDICAL RECORD NO.:  [id number]                   PATIENT TYPE:  INP   LOCATION:  3730                                 FACILITY:  [facility]   PHYSICIAN:  Mikey Bussing, M.D.           DATE OF BIRTH:  24-Nov-1924   DATE OF CONSULTATION:  03/28/2003  DATE OF DISCHARGE:                                   CONSULTATION   REFERRING PHYSICIAN:  Darlin Priestly, M.D.   PRIMARY CARE PHYSICIAN:  Dorann Lodge, M.D. in [place].   REASON FOR CONSULTATION:  Severe coronary artery disease, severe mitral  regurgitation, class III CHF.   CHIEF COMPLAINT:  Shortness of breath.   HISTORY OF PRESENT ILLNESS:  I was asked to evaluate this 77 year old white  female for potential surgical coronary revascularization and mitral valve  repair or replacement for recently diagnosed severe left main and three  vessel coronary artery disease associated with 3 to 4+ mitral regurgitation.  The patient has had several weeks of progressive increasing fatigability and  decreasing exercise tolerance. She denies any specific chest pain,  orthopnea, or PND.  She does have history of coronary artery disease and  underwent cardiac catheterization at [facility] in 2004, which by report  demonstrated moderate three vessel disease and moderate mitral regurgitation  and she was treated medically.  On medical therapy, she has progressed in  her symptoms of dyspnea on exertion and decreased exercise tolerance. She  denies any evidence of fluid retention or pedal edema.   She underwent cardiology evaluation by Dr. Lenise Herald.  A chest wall two-  dimensional echocardiogram demonstrated mild reduction in LV function with  ejection fraction of 50%, severe mitral regurgitation, and mitral annular  calcification. She then underwent right and left heart cardiac  catheterization by Dr. Jenne Campus today which demonstrated a 70% left main  stenosis, 90%  stenosis of the LAD, total occlusion of the distal circumflex,  90% stenosis of the diagonal, and 95% stenosis of the proximal right  coronary artery. Her ejection fraction was 50%.  Pulmonary artery pressures  were 50/20 with cardiac output of 2.8 liters per minute. Right atrial  pressure was 8 mmHg.  Pulmonary artery oxygen saturation was 65%. Pulmonary  capillary wedge pressure was 23.  Based on her two-dimensional  echocardiogram and catheterization data, she was felt to be a candidate for  coronary artery bypass grafting and mitral valve repair or replacement.   PAST MEDICAL HISTORY:  1. Hypertension.  2. Allergy to penicillin (swelling).  3. She chews snuff.   MEDICATIONS:  1. Toprol XL 50 mg daily.  2. Accupril 40 mg daily.  3. Lipitor 10 mg daily.  4. Prempro one p.o. daily.  5. Aspirin one p.o. daily.   SOCIAL HISTORY:  The patient is unmarried and lives by herself.  She has  several nephews and nieces that live close by.  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 She does not use alcohol.   FAMILY HISTORY:  No family history of mitral valve disease, rheumatic fever,  or premature coronary artery disease.   REVIEW OF SYSTEMS:  Her weight has been stable over the past several months.  She denies any constitutional symptoms, fever, or night sweats.  HEENT:  Negative for change in vision, difficulty swallowing. She has total dental  plates.  CHEST: Negative for recent productive cough, bronchitis, or  pneumonia. No history of chest trauma. No history of abnormal chest x-ray.  HEART:  Positive for longstanding history of cardiac murmur and recent  symptoms of class III CHF. GASTROINTESTINAL: Positive for remote history of  peptic disease in the 60's. She takes Mylanta one spoonful at night now. No  history of jaundice or abdominal pain. NEUROLOGY: Negative. ENDOCRINE:  Negative for diabetes or thyroid disease.  VASCULAR: Negative for TIA,   claudication, or DVT.  HEMATOLOGIC: Negative for bleeding disorder or blood  transfusion.  MUSCULOSKELETAL: Negative for significant fractures or  injuries.   PHYSICAL EXAMINATION:  VITAL SIGNS: She is 5 foot 5 inches and weighs 132  pounds. Blood pressure 130/70, heart rate 80 and regular in sinus,  respirations 18.  GENERAL: A pleasant, elderly white female in her hospital room following  cardiac catheterization surrounded by some of her family members. She is in  no distress.  HEENT:  Normocephalic.  Full EOM's. She is wearing eyeglasses.  Full dental  plates.  Pharynx clear.  NECK:  Without JVD, mass, thyromegaly, or carotid bruit.  LYMPHATICS: No palpable supraclavicular, or axillary adenopathy.  CHEST: Without deformity.  LUNGS:  Clear to auscultation.  HEART:  Loud 4/6 holosystolic murmur radiating to the left axilla. There is  no S3 gallop.  ABDOMEN:  Soft without mass or tenderness.  EXTREMITIES:  Without edema, tenderness, clubbing, or cyanosis. Peripheral  pulses are 2+ in all extremities. There is no evidence of venous  insufficiency of the lower extremities.  She has a compression dressing over  the right groin at the cardiac catheterization site.  RECTAL: Deferred.  SKIN:  Mild ecchymotic changes over the forearms.  NEUROLOGY:  Alert and oriented x3 with full motor function and no focal  motor deficit.   LABORATORY DATA:  Her pre-CABG Dopplers indicate normal carotid vessels  without stenosis.  Her ABI index is greater than 1.0 bilaterally.  Brachial  artery pressures are equal in each upper extremity.   I reviewed the coronary arteriography with Dr. Jenne Campus and agree with his  impression of severe left main anterior vessel disease with severe mitral  regurgitation.   RECOMMENDATIONS:  The patient would benefit from surgical revascularization  with bypass grafts planned to the LAD, diagonal, and distal right.  I believe the distal circumflex vessels are too small  to graft and they have  been chronically occluded for some time. We would attempt to perform a  mitral valve repair as the posterior leaflet appears to be prolapsed.  If  the valve is not repairable, then a mitral valve replacement with a  mechanical valve  would be the plan of choice.  I have discussed the procedure with the  patient and her family and she is in agreement to proceed.  Surgery will be  scheduled for Thursday, March 31, 2003.   Thank you very much for consultation.  Mikey Bussing, M.D.    PV/MEDQ  D:  03/28/2003  T:  03/28/2003  Job:  [id number]   cc:   [facility]   CVTS Office

## 2011-03-08 NOTE — Discharge Summary (Signed)
Amanda [name], Amanda [name]                          ACCOUNT NO.:  [id number]   MEDICAL RECORD NO.:  [id number]                   PATIENT TYPE:  INP   LOCATION:  2012                                 FACILITY:  [facility]   PHYSICIAN:  Kerin Perna, M.D.               DATE OF BIRTH:  1925/04/19   DATE OF ADMISSION:  03/28/2003  DATE OF DISCHARGE:  04/09/2003                                 DISCHARGE SUMMARY   HISTORY OF PRESENT ILLNESS:  This is a 77 year old white female patient with  a known history of coronary artery disease as well as mitral valve  dysfunction who had previously undergone cardiac catheterization at [facility] in 2004 which by report demonstrated moderate three  vessel disease and moderate mitral regurgitation and she was treated  medically.  She continued to have increase in symptoms with dyspnea on  exertion as well as decreased exercise tolerance.  She had no definitive  congestive heart failure symptoms.  She was admitted for further evaluation  including cardiac catheterization with Darlin Priestly, M.D. which was  performed on March 28, 2003 which demonstrated a 70% left main stenosis, a 90%  stenosis of the LAD, total occlusion of the distal circumflex, 90% stenosis  of the diagonal, and 95% stenosis of the proximal right coronary artery.  Her ejection fraction was 50%.  Pulmonary artery pressures were elevated at  50/20 and cardiac output of 2.8 L/minute was noted.  Right atrial pressure  was 8 mmHg.  Pulmonary artery oxygen saturation was 65%.  Pulmonary  capillary wedge pressure was 23.  Additionally, an echocardiogram was  performed preoperatively showing mild reduction in left ventricular function  with ejection fraction of 50%, severe mitral regurgitation, and mitral  annular calcification.  Based on the findings of this data, the patient was  felt to be a surgical revascularization and valve surgery candidate and  consultation was obtained with  Kerin Perna, M.D. who evaluated patient  as well as her studies and recommendations to proceed with surgical  revascularization as well as either mitral valve repair or replacement.   PAST MEDICAL HISTORY:  1. Hypertension.  2. Allergy to penicillin which causes swelling.  3. History of snuff use.   MEDICATIONS ON ADMISSION:  1. Toprol XL 50 mg daily.  2. Accupril 40 mg daily.  3. Lipitor 10 mg daily.  4. Prempro one p.o. daily.  5. Aspirin one p.o. daily.   FAMILY HISTORY:  Please see the history and physical done at the time of  admission.   SOCIAL HISTORY:  Please see the history and physical done at the time of  admission.   REVIEW OF SYSTEMS:  Please see the history and physical done at the time of  admission.   PHYSICAL EXAMINATION:  Please see the history and physical done at the time  of admission.   HOSPITAL COURSE:  The patient was  admitted through the cardiology service,  Darlin Priestly, M.D. and as described above the cardiac catheterization  was performed.  Due to these findings, as noted, the patient was  subsequently scheduled for surgery which was performed on March 31, 2003.  The following procedure was performed:  Coronary artery bypass grafting x3  (left internal mammary artery to the left anterior descending coronary  artery, saphenous vein graft to the right coronary artery, and saphenous  vein graft to the circumflex); mitral valve replacement with a 27 mm ATS  mechanical valve.  The procedure was performed by Kerin Perna, M.D.  The  patient tolerated procedure well.  Was taken to the surgical intensive care  unit in stable condition.  Postoperative hospital course:  The patient did  well postoperatively.  She did not require aggressive diuresis.  She was  weaned from the ventilator and inotropic agents without difficulty.  She  initially did have some elevation in her capillary blood glucoses but these  have come down to a normalized level.   All routine monitoring lines and  devices have been discontinued in a stepwise manner.  She has had some  postoperative atrial fibrillation and has been treated with multiple agents  to provide chemical cardioversion.  Currently, her rhythm is stabilized into  a normal sinus rhythm.  She has been started on her Coumadin prophylaxis for  the mechanical mitral valve.  It is noted during the postoperative period  that she has had some increase in her liver function studies including most  recent laboratory values to include total bilirubin of 1.6, direct bilirubin  0.4, indirect bilirubin 1.2, alkaline phosphatase 107, SGOT 51, SGPT 47, and  albumin 2.9.  Other laboratories are felt to be stable including  electrolytes, BUN and creatinine.  Most recent CBC reveals her hemoglobin  and hematocrit to be stable at 9.9 and 28.3, respectively on April 05, 2003.  It is noted per the chart that the liver function studies will be monitored  as an outpatient and currently it is felt that her Statin should be placed  on hold.  Overall, the patient is felt to be quite stable for tentative  discharge in the morning of April 09, 2003 pending morning round  reevaluation.   DISCHARGE MEDICATIONS:  1. Coumadin alternating daily dose of 2.5 and 5 mg.  It is noted a home     health nurse is going to arrange to have PT/INR blood tests drawn for     Coumadin management through Darlin Priestly, M.D. office.  2. Zestril 20 mg daily.  3. Aspirin 81 mg daily.  4. Lopressor 25 mg b.i.d.  5. Lasix 40 mg daily for 14 days.  6. Potassium chloride 20 mEq daily for 14 days.  7. Amiodarone 200 mg q.12h.   INSTRUCTIONS:  The patient received written instructions in regard to  medications, activity, diet, wound care, and follow-up.  Follow-up will  include two-week appointment to see Darlin Priestly, M.D., three-week  appointment to see Kerin Perna, M.D. with a chest x-ray from the  [facility].  FINAL DIAGNOSES:  1. Severe coronary artery disease now status post coronary artery bypass     grafting x3.  2. Mitral valve replacement.  3. Postoperative paroxysmal atrial fibrillation now sustaining sinus rhythm     on amiodarone.  4. Hypertension which is controlled.  5. Hyperlipidemia.  6. Abnormal liver function studies.  7. Volume overload.  8. Possible borderline early type  2 diabetes mellitus, currently on dietary     management.     Rowe Clack, P.A.-C.                    Kerin Perna, M.D.    Sherryll Burger  D:  04/08/2003  T:  04/11/2003  Job:  [id number]   cc:   Mikey Bussing, M.D.  [address]  [place]  [place] [address]  Fax: [phone]   Darlin Priestly, M.D.  [phone] [address]  [place]  [place] [address]  Fax: [phone]   Dorann Lodge  [address]  [place]  [place] [address]  Fax: [phone]    cc:   Mikey Bussing, M.D.  [address]  [place]  [place] [address]  Fax: [phone]   Darlin Priestly, M.D.  [phone] [address], [address]  [place]  [place] [address]  Fax: [phone]   Dorann Lodge  [address]  [place]  [place] [address]  Fax: [phone]

## 2011-03-08 NOTE — Op Note (Signed)
NAMEIRENA, GAYDOS                          ACCOUNT NO.:  [id number]   MEDICAL RECORD NO.:  [id number]                   PATIENT TYPE:  INP   LOCATION:  2308                                 FACILITY:  [facility]   PHYSICIAN:  Kerin Perna III, M.D.           DATE OF BIRTH:  January 24, 1925   DATE OF PROCEDURE:  03/31/2003  DATE OF DISCHARGE:                                 OPERATIVE REPORT   PREOPERATIVE DIAGNOSES:  1. Severe three-vessel coronary disease.  2. Reduced left ventricular function.  3. Mitral regurgitation 3+.  4. Unstable angina with left main stenosis.   POSTOPERATIVE DIAGNOSES:  1. Severe three-vessel coronary disease.  2. Reduced left ventricular function.  3. Mitral regurgitation 3+.  4. Unstable angina with left main stenosis.   OPERATIONS:  1. Coronary artery bypass grafting x3 (left internal mammary artery to left     anterior descending coronary artery, saphenous vein graft to right     coronary artery, saphenous vein graft to circumflex).  2. Mitral valve replacement with a 27 mm ATS mechanical valve.  3. Placement of a left femoral arterial line.   SURGEON:  Kerin Perna, M.D.   ASSISTANTS:  Toribio Harbour, N.P., and Coral Ceo, P.A.-C.   ANESTHESIA:  General by Maren Beach, M.D.   INDICATIONS:  The patient is a 77 year old white female who presented with  symptoms of unstable angina as well as dyspnea on exertion and ruled out for  MI.  Cardiac catheterization and 2 D echo demonstrated severe three-vessel  coronary disease with left main stenosis, reduced LV function, and 3+ mitral  regurgitation.  She was felt to be a candidate for surgical  revascularization and mitral valve repair or replacement.  Prior to her  surgery I examined the patient in her hospital room and reviewed the results  of the cardiac catheterization and 2 D echo with the patient.  I discussed  the indications and expected benefits of coronary bypass surgery and  mitral  valve repair or replacement for treatment of her coronary disease and mitral  valvular disease.  I discussed the alternative therapies to surgical  intervention for treatment of her heart disease and the expected outcome of  those alternative therapies.  I reviewed with the patient the major aspects  of the proposed operation, including the location of the surgical incisions,  the choice of conduit for grafting, preference to perform a mitral valve  repair over a mitral valve replacement if possible, the use of general  anesthesia and cardiopulmonary bypass, and the expected postoperative  hospital recovery period.  I discussed with the patient the risks to her of  coronary bypass surgery and mitral valve replacement, including the risks of  MI, CVA, bleeding, blood transfusion requirement, infection, and death.  She  understood these implications for the surgery and agreed to proceed with the  operation as planned under what I felt was an  informed consent.   OPERATIVE FINDINGS:  The patient's mitral regurgiation was documented by  preoperative TEE showing a regurgitant jet coursing along the  interventricular septum.  There was global hypokinesia.  The saphenous vein  was harvested endoscopically and was of average quality.  The mammary artery  was small.  The coronary vessels were very small and heavily diffusely  diseased.  The mitral valve was with posterior prolapse and a flail leaflet,  and this was treated with an attempted repair; however, there was persistent  regurgitation requiring mitral valve replacement with a mechanical ATS  valve.  The patient was given FFP and platelets in the operating room for  evidence of intraoperative coagulopathy.   PROCEDURE:  The patient was brought to the operating room and placed supine  on the operating table, where general anesthesia was induced under invasive  hemodynamic monitoring.  The chest, abdomen, and legs were prepped with   Betadine and draped as a sterile field.  The transesophageal 2 D echo probe  was placed by the anesthesiologist, and the preoperative diagnosis of mitral  regurgitation was confirmed.  A sternal incision was made as the saphenous  vein was harvested from the right leg using endoscopic vein technique.  The  left internal mammary artery was harvested as a pedicle graft from its  origin at the subclavian vessels.  Heparin was administered, and the ACT was  documented as being therapeutic.  The sternal retractor was placed.  Through  pursestrings placed in the ascending aorta and right atrium, the patient was  cannulated and placed on bypass.  Bi-caval cannulas were placed and  cardioplegia cannulas for both antegrade aortic and retrograde coronary  sinus catheters were placed.  The coronaries were identified for grafting  and the interatrial groove was dissected out.  The patient was cooled to 30  degrees and as the aortic crossclamp was applied, a total of 800 mL of cold  blood cardioplegia was delivered in split doses between the antegrade aortic  and retrograde coronary sinus catheters.  There was a good cardioplegic  arrest and septal temperature dropping to less than 14 degrees.  Topical  iced saline slush was used to augment myocardial preservation, and a  pericardial insulator pad was used to protect the left phrenic nerve.   The distal coronary anastomoses were then performed.  The first distal  anastomosis was to the distal RCA.  This was a 1.5 mm vessel with proximal  95% stenosis.  A reversed saphenous vein was sewn end-to-side with running 7-  0 Prolene with good flow through the graft.  The second distal anastomosis  was to the distal circumflex.  This was totally occluded.  It was a 1.5 mm  vessel.  A reversed saphenous vein was sewn end-to-side with a running 7-0  Prolene, and there was good flow through the graft.  Cardioplegia was redosed.  The third distal anastomosis was to  the distal third of the LAD  just after the takeoff of the diagonal.  This was a 1.4 mm vessel with  proximal 95% stenosis.  The left internal mammary artery pedicle was brought  through an opening created in the left lateral pericardium and was brought  down onto the LAD and sewn end-to-side with a running 8-0 Prolene.  There  was excellent flow through the anastomosis with immediate rise in septal  temperature after release of the pedicle clamp on the mammary artery.  The  mammary pedicle was secured to the epicardium and  the pedicle clamp was  reapplied.  Cardioplegia was redosed.   A transverse atriotomy was then made and the mitral valve was inspected  after the atrial retractors were placed in position.  The middle portion of  the posterior leaflet appeared to have some prolapse, and there were one or  two flail chords along the free edge of the posterior leaflet.  It was felt  that the leaflet was repairable.  Due to the patient's size and body  habitus, it was a posterior-positioned mitral valve and somewhat difficult  to visualize.  A posterior leaflet resection was then performed of the flail  leaflet segment.  This was then repaired using a compression suture of 2-0  Ethibond at the annulus and interrupted 5-0 Ethibond sutures to reappose the  posterior leaflet.  An annuloplasty ring (26 mm Seguin) was then positioned  and secured.  The valve was tested with cold saline and appeared to be  competent.  The patient was then rewarmed and the atriotomy was closed after  air was removed from the left side of the heart using the usual de-airing  maneuvers and a dose of warm retrograde blood cardioplegia.  The aortic  crossclamp was then removed.  The heart resumed a spontaneous rhythm.  Two  proximal vein anastomoses were placed on the ascending aorta using a 4.0 mm  punch and running 6-0 Prolene.  The vein grafts were perfused.  The mammary  artery was perfused.  There was good flow  in all the grafts, and hemostasis  was documented at the proximal and distal anastomoses.  Temporary pacing  wires were applied and the ventilator was resumed.  The patient was then  weaned from bypass on low-dose dopamine.  After separation from bypass, the  transesophageal echo probe was used to interrogate the mitral valve.  There  appeared to be residual mitral regurgitation which at first did not seem to  be significant.  However, the pulmonary artery pressures continued to rise  to the range of 50/25-30.  Closer examination of the mitral valve using  further angles of examination demonstrated residual significant mitral  regurgitation.  It was decided to place the patient back on bypass to  perform a mitral valve replacement.  The patient had remained  hemodynamically stable and was never hypotensive after coming off bypass.  The patient was then re-heparinized.  The ACT was documented as being therapeutic.  The cannulas were repositioned, and she was placed on bypass.  Cardioplegia cannulas were again placed for antegrade and retrograde cold  blood cardioplegia.  The patient was cooled to 30 degrees as the aortic  crossclamp was applied and a dose of 800 mL of cold blood cardioplegia was  delivered in split doses between the antegrade and retrograde catheters.  There was again good cardioplegic arrest.  An atraumatic vascular bulldog  clamp was placed on the mammary pedicle during the time of crossclamp.   The left atriotomy was then reopened and the atrial retractors were  positioned.  The ring was intact.  The valve repair sutures were intact.  We  decided to replace the valve since the patient had documented residual  significant regurgitation.  The mitral valve was then excised and the  annulus, especially the posterior annulus, debrided of some calcium.  The  left atrium and left ventricle were irrigated with copious amounts of cold  saline.  Sixteen superannular 2-0 pledgeted  Ethibond sutures were placed  around the annulus.  The annulus was sized  to a 27 mm ATS mechanical valve.  The sutures were placed through the sewing ring of the ATS valve, which was  seated, and the sutures were tied.  The valve was inspected and found to  open and close without impingement.  The atriotomy was closed in two layers  using a running 4-0 Prolene.  The air was removed from the left side of the  heart and the coronaries using a dose of warm retrograde blood cardioplegia  as well as the usual de-airing maneuvers.  The crossclamp was then removed  and the pedicle clamp was removed from the mammary artery.  The heart  resumed a paced rhythm.  The patient was rewarmed and reperfused.  Bypass  grafts were then inspected and found to be widely patent and hemostatic.  The lungs were re-expanded and the ventilator was resumed.  The patient was  then again weaned from bypass on low-dose dopamine as well as milrinone with  good cardiac output and a stable blood pressure.  Protamine was administered  without adverse reaction.  The cannulas were removed.  The mediastinum was  irrigated with warm antibiotic irrigation.  The transesophageal 2 D echo  showed the prosthetic mitral valve to be functioning well without  significant mitral regurgitation.  There was baseline global LV hypokinesia.  The patient was given a platelet and FFP transfusion for evidence of  intraoperative coagulopathy.  This improved coagulation function.  The leg  incision was irrigated and closed in a standard fashion.  A left femoral  artery arterial catheter was placed over a guidewire and secured to the skin  for blood pressure monitoring.  The superior pericardial fat was closed.  Two mediastinal and a left pleural chest tube were placed and brought out  through separate incisions.  The sternum was reapproximated with interrupted  steel wire.  The pectoralis fascia was closed with running #1 Vicryl.   The subcutaneous and skin were closed in a running Vicryl and sterile dressings  were applied.  Total bypass time for  the three-vessel bypass and mitral valve repair was 200 minutes, and an  additional 100 minutes on bypass was used to replace the mitral valve with  the mechanical valve.  The patient returned to the ICU in stable but guarded  condition.                                                  Mikey Bussing, M.D.    PV/MEDQ  D:  03/31/2003  T:  04/01/2003  Job:  [id number]   cc:   Darlin Priestly, M.D.  [phone] [address]  [place]  [place] [address]  Fax: [phone]

## 2011-04-17 ENCOUNTER — Other Ambulatory Visit: Payer: Self-pay | Admitting: Emergency Medicine

## 2011-04-17 MED ORDER — EZETIMIBE-SIMVASTATIN 10-20 MG PO TABS: 1.0000 | ORAL_TABLET | Freq: Every day | ORAL | Status: DC

## 2011-05-17 ENCOUNTER — Telehealth: Payer: Self-pay

## 2011-05-17 MED ORDER — EZETIMIBE-SIMVASTATIN 10-20 MG PO TABS: 1.0000 | ORAL_TABLET | Freq: Every day | ORAL | Status: DC

## 2011-05-17 NOTE — Telephone Encounter (Signed)
Needs a refill on vytorin 10-20 mg take one tablet daily.

## 2011-05-23 ENCOUNTER — Ambulatory Visit (INDEPENDENT_AMBULATORY_CARE_PROVIDER_SITE_OTHER): Payer: Medicare Other | Admitting: Cardiovascular Disease

## 2011-05-23 ENCOUNTER — Encounter: Payer: Self-pay | Admitting: Cardiovascular Disease

## 2011-05-23 VITALS — BP 144/79 | HR 69 | Ht 65.0 in | Wt 127.0 lb

## 2011-05-23 DIAGNOSIS — E785 Hyperlipidemia, unspecified: Secondary | ICD-10-CM

## 2011-05-23 DIAGNOSIS — Z9889 Other specified postprocedural states: Secondary | ICD-10-CM

## 2011-05-23 DIAGNOSIS — I25810 Atherosclerosis of coronary artery bypass graft(s) without angina pectoris: Secondary | ICD-10-CM

## 2011-05-23 DIAGNOSIS — I2510 Atherosclerotic heart disease of native coronary artery without angina pectoris: Secondary | ICD-10-CM

## 2011-05-23 MED ORDER — EZETIMIBE-SIMVASTATIN 10-20 MG PO TABS: 1.0000 | ORAL_TABLET | Freq: Every day | ORAL | Status: DC

## 2011-05-23 MED ORDER — ROSUVASTATIN CALCIUM 20 MG PO TABS: 20.0000 mg | ORAL_TABLET | Freq: Every day | ORAL | Status: DC

## 2011-05-23 MED ORDER — METOPROLOL SUCCINATE ER 50 MG PO TB24: 75.0000 mg | ORAL_TABLET | ORAL | Status: DC

## 2011-05-23 NOTE — Patient Instructions (Addendum)
You are doing well. Stop the vytorin when you run out Start the crestor 20 mg daily We should check cholesterol in 09/2011 (pt will have done outside of office, order given.) Please call us if you have new issues that need to be addressed before your next appt.  We will call you for a follow up Appt. In 6 months

## 2011-05-23 NOTE — Progress Notes (Signed)
Patient ID: Amanda [name], female    DOB: [birth year], 77 y.o.   MRN: [id number]  HPI Comments: 43 -year-old woman with a past medical history of coronary artery disease, bypass surgery in 2004 at [facility] with a mechanical mitral valve, with history of hypertension, hyperlipidemia who presents for routine followup.   No significant changes from her last clinic visit. She reports that overall she is feeling well. She denies any significant shortness of breath or chest discomfort. She uses a cane to ambulate, has had no recent falls though states that her gait is somewhat unstable. She does have a little bit of swelling in her right ankle, Which is chronic. she is currently living with family and has to walk up stairs here she does not do a regular exercise program. She denies any weakness in her legs.   Most recent cholesterol was slightly above goal with total cholesterol 178, LDL 78  EKG shows normal sinus rhythm with rate 69 beats per minute, Right bundle branch block, nonspecific ST changes in leads V3 through V6.    Outpatient Encounter Prescriptions as of 05/23/2011  Medication Sig Dispense Refill  . amLODipine-benazepril (LOTREL) 5-20 MG per capsule Take 1 capsule by mouth daily.        Marland Kitchen aspirin 81 MG EC tablet Take 81 mg by mouth daily.        Marland Kitchen ezetimibe-simvastatin (VYTORIN) 10-20 MG per tablet Take 1 tablet by mouth at bedtime.  30 tablet  11  . loratadine (CLARITIN) 10 MG tablet Take 10 mg by mouth daily as needed.        . metFORMIN (GLUMETZA) 500 MG (MOD) 24 hr tablet Take 500 mg by mouth daily with breakfast.        . metoprolol (TOPROL-XL) 50 MG 24 hr tablet Take 1.5 tablets (75 mg total) by mouth as directed. Take one and a half tablets daily  45 tablet  11  . warfarin (COUMADIN) 4 MG tablet Take as directed by the anti-coagulation clinic.      Marland Kitchen  ezetimibe-simvastatin (VYTORIN) 10-20 MG per tablet Take 1 tablet by mouth at bedtime.  30 tablet  6     Review of  Systems  Constitutional: Negative.   HENT: Negative.   Eyes: Negative.   Respiratory: Negative.   Cardiovascular: Negative.        Right ankle swelling, into the foot which is chronic.  Gastrointestinal: Negative.   Musculoskeletal: Negative.   Skin: Negative.   Neurological: Negative.   Hematological: Negative.   Psychiatric/Behavioral: Negative.   All other systems reviewed and are negative.    BP 144/79  Pulse 69  Ht 5\' 5"  (1.651 m)  Wt 127 lb (57.607 kg)  BMI 21.13 kg/m2  Physical Exam  Nursing note and vitals reviewed. Constitutional: She is oriented to person, place, and time. She appears well-developed and well-nourished.  HENT:  Head: Normocephalic.  Nose: Nose normal.  Mouth/Throat: Oropharynx is clear and moist.  Eyes: Conjunctivae are normal. Pupils are equal, round, and reactive to light.  Neck: Normal range of motion. Neck supple. No JVD present.  Cardiovascular: Normal rate, regular rhythm, S1 normal, S2 normal, normal heart sounds and intact distal pulses.  Exam reveals no gallop and no friction rub.   No murmur heard. Pulmonary/Chest: Effort normal and breath sounds normal. No respiratory distress. She has no wheezes. She has no rales. She exhibits no tenderness.  Abdominal: Soft. Bowel sounds are normal. She exhibits no distension. There is  no tenderness.  Musculoskeletal: Normal range of motion. She exhibits no edema and no tenderness.  Lymphadenopathy:    She has no cervical adenopathy.  Neurological: She is alert and oriented to person, place, and time. Coordination normal.  Skin: Skin is warm and dry. No rash noted. No erythema.  Psychiatric: She has a normal mood and affect. Her behavior is normal. Judgment and thought content normal.         Assessment and Plan

## 2011-05-23 NOTE — Assessment & Plan Note (Signed)
INR is monitored by Dr. Leonor Liv. She reports it is doing better though occasionally in the summer, her INR numbers do go up and down.

## 2011-05-23 NOTE — Assessment & Plan Note (Signed)
Currently with no symptoms of angina. No further workup at this time. Continue current medication regimen. 

## 2011-05-23 NOTE — Assessment & Plan Note (Signed)
Cholesterol is above goal. We'll try to push her total cholesterol to less than 150. I do not want to increase her Simvastatin as she is on amlodipine. We will change her to Crestor 20 mg daily. I have suggested we check her cholesterol in 3 months time.

## 2011-07-16 ENCOUNTER — Telehealth: Payer: Self-pay | Admitting: *Deleted

## 2011-07-16 MED ORDER — ROSUVASTATIN CALCIUM 20 MG PO TABS: 20.0000 mg | ORAL_TABLET | Freq: Every day | ORAL | Status: DC

## 2011-07-16 NOTE — Telephone Encounter (Signed)
Son requested refill of pt's crestor, she misplaced original.

## 2011-09-06 ENCOUNTER — Telehealth: Payer: Self-pay

## 2011-09-06 MED ORDER — AMLODIPINE BESY-BENAZEPRIL HCL 5-20 MG PO CAPS: 1.0000 | ORAL_CAPSULE | Freq: Every day | ORAL | Status: DC

## 2011-09-06 NOTE — Telephone Encounter (Signed)
Refill sent for amlodipine/benazepril 5/20 mg take one tablet daily.

## 2012-05-08 ENCOUNTER — Other Ambulatory Visit: Payer: Self-pay | Admitting: *Deleted

## 2012-05-08 MED ORDER — METOPROLOL SUCCINATE ER 50 MG PO TB24: ORAL_TABLET | ORAL | Status: DC

## 2012-05-08 MED ORDER — AMLODIPINE BESY-BENAZEPRIL HCL 5-20 MG PO CAPS: 1.0000 | ORAL_CAPSULE | Freq: Every day | ORAL | Status: DC

## 2012-05-08 NOTE — Telephone Encounter (Signed)
LMTCB due for a 6 mon. F/u appointment;Refilled Amlodipine and Metoprolol.

## 2012-05-29 ENCOUNTER — Ambulatory Visit (INDEPENDENT_AMBULATORY_CARE_PROVIDER_SITE_OTHER): Payer: Medicare Other | Admitting: Cardiovascular Disease

## 2012-05-29 ENCOUNTER — Encounter: Payer: Self-pay | Admitting: Cardiovascular Disease

## 2012-05-29 VITALS — BP 150/66 | HR 80 | Ht 64.0 in | Wt 124.5 lb

## 2012-05-29 DIAGNOSIS — I2510 Atherosclerotic heart disease of native coronary artery without angina pectoris: Secondary | ICD-10-CM

## 2012-05-29 DIAGNOSIS — E785 Hyperlipidemia, unspecified: Secondary | ICD-10-CM

## 2012-05-29 DIAGNOSIS — I10 Essential (primary) hypertension: Secondary | ICD-10-CM | POA: Insufficient documentation

## 2012-05-29 DIAGNOSIS — R0602 Shortness of breath: Secondary | ICD-10-CM

## 2012-05-29 DIAGNOSIS — I25810 Atherosclerosis of coronary artery bypass graft(s) without angina pectoris: Secondary | ICD-10-CM

## 2012-05-29 DIAGNOSIS — E119 Type 2 diabetes mellitus without complications: Secondary | ICD-10-CM | POA: Insufficient documentation

## 2012-05-29 DIAGNOSIS — Z9889 Other specified postprocedural states: Secondary | ICD-10-CM

## 2012-05-29 MED ORDER — AMLODIPINE BESY-BENAZEPRIL HCL 5-20 MG PO CAPS: 1.0000 | ORAL_CAPSULE | Freq: Every day | ORAL | Status: DC

## 2012-05-29 MED ORDER — METOPROLOL SUCCINATE ER 50 MG PO TB24: ORAL_TABLET | ORAL | Status: DC

## 2012-05-29 NOTE — Assessment & Plan Note (Signed)
We have recommended she work on her weight, possibly increase her metformin to twice a day for better diabetes control. Ideally we would like to zetia or increase her Crestor to 40 mg daily for goal LDL less than 70.

## 2012-05-29 NOTE — Assessment & Plan Note (Signed)
Will repeat echocardiogram next year. Echocardiogram in 2012 showing well-functioning prosthetic valve.

## 2012-05-29 NOTE — Patient Instructions (Addendum)
You are doing well. No medication changes were made. Please monitor your blood pressure   Please call us if you have new issues that need to be addressed before your next appt.  Your physician wants you to follow-up in: 12 months.  You will receive a reminder letter in the mail two months in advance. If you don't receive a letter, please call our office to schedule the follow-up appointment.

## 2012-05-29 NOTE — Progress Notes (Signed)
Patient ID: Amanda [name], female    DOB: 13-Aug-1925, 78 y.o.   MRN: [id number]  HPI Comments: 78 -year-old woman with a past medical history of coronary artery disease, bypass surgery in 2004 at [facility] with a mechanical mitral valve, with history of hypertension, hyperlipidemia who presents for routine followup.   No significant changes from her last clinic visit. She reports that overall she is feeling well. She denies any significant shortness of breath or chest discomfort. She uses a cane to ambulate, has had no recent falls though states that her gait is somewhat unstable. She does have a little bit of swelling in her right ankle. she is currently living with family. She denies any weakness in her legs. She reports that her sugars have been mildly elevated.   Most recent cholesterol was  above goal with total cholesterol 178, LDL 78 She was previously changed from vytorin  to Crestor.  EKG shows normal sinus rhythm with rate 80 beats per minute, Right bundle branch block, nonspecific ST changes in leads V3 through V6.    Outpatient Encounter Prescriptions as of 05/29/2012  Medication Sig Dispense Refill  . amLODipine-benazepril (LOTREL) 5-20 MG per capsule Take 1 capsule by mouth daily.  30 capsule  1  . aspirin 81 MG EC tablet Take 81 mg by mouth daily.        Marland Kitchen loratadine (CLARITIN) 10 MG tablet Take 10 mg by mouth daily as needed.        . meclizine (ANTIVERT) 25 MG tablet Take 25 mg by mouth 2 (two) times daily.      . metFORMIN (GLUMETZA) 500 MG (MOD) 24 hr tablet Take 500 mg by mouth daily with breakfast.        . metoprolol succinate (TOPROL-XL) 50 MG 24 hr tablet Take one and a half tablets daily  45 tablet  1  . rosuvastatin (CRESTOR) 20 MG tablet Take 1 tablet (20 mg total) by mouth at bedtime.  30 tablet  11  . warfarin (COUMADIN) 4 MG tablet Take as directed by the anti-coagulation clinic.      Marland Kitchen DISCONTD: ezetimibe-simvastatin (VYTORIN) 10-20 MG per tablet Take  1 tablet by mouth at bedtime.  30 tablet  11      Review of Systems  Constitutional: Negative.   HENT: Negative.   Eyes: Negative.   Respiratory: Negative.   Cardiovascular: Negative.        Right ankle swelling, into the foot which is chronic.  Gastrointestinal: Negative.   Musculoskeletal: Negative.   Skin: Negative.   Neurological: Negative.   Hematological: Negative.   Psychiatric/Behavioral: Negative.   All other systems reviewed and are negative.    BP 150/66  Pulse 80  Ht 5\' 4"  (1.626 m)  Wt 124 lb 8 oz (56.473 kg)  BMI 21.37 kg/m2 Repeat blood pressure was in the low 140 range Physical Exam  Nursing note and vitals reviewed. Constitutional: She is oriented to person, place, and time. She appears well-developed and well-nourished.  HENT:  Head: Normocephalic.  Nose: Nose normal.  Mouth/Throat: Oropharynx is clear and moist.  Eyes: Conjunctivae are normal. Pupils are equal, round, and reactive to light.  Neck: Normal range of motion. Neck supple. No JVD present.  Cardiovascular: Normal rate, regular rhythm, S1 normal, S2 normal, normal heart sounds and intact distal pulses.  Exam reveals no gallop and no friction rub.   No murmur heard. Pulmonary/Chest: Effort normal and breath sounds normal. No respiratory distress. She has  no wheezes. She has no rales. She exhibits no tenderness.  Abdominal: Soft. Bowel sounds are normal. She exhibits no distension. There is no tenderness.  Musculoskeletal: Normal range of motion. She exhibits no edema and no tenderness.  Lymphadenopathy:    She has no cervical adenopathy.  Neurological: She is alert and oriented to person, place, and time. Coordination normal.  Skin: Skin is warm and dry. No rash noted. No erythema.  Psychiatric: She has a normal mood and affect. Her behavior is normal. Judgment and thought content normal.         Assessment and Plan

## 2012-05-29 NOTE — Assessment & Plan Note (Signed)
We have recommended she talk with her primary care physician about increasing her metformin to twice a day for better diabetes control. This would help her cholesterol as well.

## 2012-05-29 NOTE — Assessment & Plan Note (Signed)
We have suggested she closely monitor her blood pressure. She reports having mildly elevated blood pressure when last checked by Dr. Leonor Liv. Blood pressure is also elevated today.

## 2012-05-29 NOTE — Assessment & Plan Note (Signed)
Currently with no symptoms of angina. No further workup at this time. Continue current medication regimen. 

## 2012-07-20 ENCOUNTER — Other Ambulatory Visit: Payer: Self-pay | Admitting: *Deleted

## 2012-07-20 MED ORDER — ROSUVASTATIN CALCIUM 20 MG PO TABS: 20.0000 mg | ORAL_TABLET | Freq: Every day | ORAL | Status: DC

## 2012-07-20 NOTE — Telephone Encounter (Signed)
Refilled Crestor. 

## 2013-05-31 ENCOUNTER — Ambulatory Visit: Payer: Medicare Other | Admitting: Cardiovascular Disease

## 2013-06-23 ENCOUNTER — Ambulatory Visit: Payer: Medicare Other | Admitting: Cardiovascular Disease

## 2013-06-28 ENCOUNTER — Other Ambulatory Visit: Payer: Self-pay

## 2013-06-28 MED ORDER — METOPROLOL SUCCINATE ER 50 MG PO TB24: ORAL_TABLET | ORAL | Status: DC

## 2013-06-28 MED ORDER — ROSUVASTATIN CALCIUM 20 MG PO TABS: 20.0000 mg | ORAL_TABLET | Freq: Every day | ORAL | Status: DC

## 2013-06-28 NOTE — Telephone Encounter (Signed)
Refill sent for crestor 20 mg  

## 2013-06-28 NOTE — Telephone Encounter (Signed)
Refill sent for metoprolol succ 

## 2013-08-16 ENCOUNTER — Ambulatory Visit (INDEPENDENT_AMBULATORY_CARE_PROVIDER_SITE_OTHER): Payer: Medicare Other | Admitting: Cardiovascular Disease

## 2013-08-16 ENCOUNTER — Encounter: Payer: Self-pay | Admitting: Cardiovascular Disease

## 2013-08-16 VITALS — BP 127/71 | HR 78 | Ht 64.0 in | Wt 104.8 lb

## 2013-08-16 DIAGNOSIS — E785 Hyperlipidemia, unspecified: Secondary | ICD-10-CM

## 2013-08-16 DIAGNOSIS — I10 Essential (primary) hypertension: Secondary | ICD-10-CM

## 2013-08-16 DIAGNOSIS — Z9889 Other specified postprocedural states: Secondary | ICD-10-CM

## 2013-08-16 DIAGNOSIS — I25810 Atherosclerosis of coronary artery bypass graft(s) without angina pectoris: Secondary | ICD-10-CM

## 2013-08-16 NOTE — Assessment & Plan Note (Signed)
We will suggest repeat echocardiogram early next year. Doing well on warfarin.

## 2013-08-16 NOTE — Assessment & Plan Note (Signed)
Blood pressure is well controlled on today's visit. No changes made to the medications. 

## 2013-08-16 NOTE — Progress Notes (Signed)
Patient ID: Amanda [name], female    DOB: [birth year], 79 y.o.   MRN: [id number]  HPI Comments: 18 -year-old woman with a past medical history of coronary artery disease, bypass surgery in 2004 at [facility] with a mechanical mitral valve on warfarin, with history of hypertension, hyperlipidemia who presents for routine followup.   No significant changes from her last clinic visit. Weight continues to be very low. Recently started on a Butran patch for her severe back pain with significant improvement in her symptoms. She's able to walk better now.  She denies any significant shortness of breath or chest discomfort. She uses a cane to ambulate, has had no recent falls though states that her gait is somewhat unstable.     total cholesterol 178, LDL 78 She was previously changed from vytorin  to Crestor.  EKG shows normal sinus rhythm with rate 78 beats per minute, Right bundle branch block, nonspecific ST changes in leads V3 through V6.    Outpatient Encounter Prescriptions as of 08/16/2013  Medication Sig Dispense Refill  . amLODipine-benazepril (LOTREL) 5-20 MG per capsule Take 1 capsule by mouth daily.  30 capsule  11  . aspirin 81 MG EC tablet Take 81 mg by mouth daily.        Marland Kitchen BUTRANS 15 MCG/HR PTWK once a week.       . lidocaine (LIDODERM) 5 % Place 1 patch onto the skin daily.       Marland Kitchen loratadine (CLARITIN) 10 MG tablet Take 10 mg by mouth daily as needed.        . meclizine (ANTIVERT) 25 MG tablet Take 25 mg by mouth 2 (two) times daily.      . metFORMIN (GLUMETZA) 500 MG (MOD) 24 hr tablet Take 500 mg by mouth daily with breakfast.        . metoprolol succinate (TOPROL-XL) 50 MG 24 hr tablet Take one and a half tablets daily  45 tablet  11  . rosuvastatin (CRESTOR) 20 MG tablet Take 1 tablet (20 mg total) by mouth at bedtime.  30 tablet  11  . warfarin (COUMADIN) 4 MG tablet Take as directed by the anti-coagulation clinic.       No facility-administered encounter  medications on file as of 08/16/2013.      Review of Systems  Constitutional: Negative.   HENT: Negative.   Eyes: Negative.   Respiratory: Negative.   Cardiovascular: Negative.        Right ankle swelling, into the foot which is chronic.  Gastrointestinal: Negative.   Endocrine: Negative.   Musculoskeletal: Negative.   Skin: Negative.   Allergic/Immunologic: Negative.   Neurological: Negative.   Hematological: Negative.   Psychiatric/Behavioral: Negative.   All other systems reviewed and are negative.    BP 127/71  Pulse 78  Ht 5\' 4"  (1.626 m)  Wt 104 lb 12 oz (47.514 kg)  BMI 17.97 kg/m2  Physical Exam  Nursing note and vitals reviewed. Constitutional: She is oriented to person, place, and time. She appears well-developed and well-nourished.  HENT:  Head: Normocephalic.  Nose: Nose normal.  Mouth/Throat: Oropharynx is clear and moist.  Eyes: Conjunctivae are normal. Pupils are equal, round, and reactive to light.  Neck: Normal range of motion. Neck supple. No JVD present.  Cardiovascular: Normal rate, regular rhythm, S1 normal, S2 normal, normal heart sounds and intact distal pulses.  Exam reveals no gallop and no friction rub.   No murmur heard. Pulmonary/Chest: Effort normal and breath sounds  normal. No respiratory distress. She has no wheezes. She has no rales. She exhibits no tenderness.  Abdominal: Soft. Bowel sounds are normal. She exhibits no distension. There is no tenderness.  Musculoskeletal: Normal range of motion. She exhibits no edema and no tenderness.  Lymphadenopathy:    She has no cervical adenopathy.  Neurological: She is alert and oriented to person, place, and time. Coordination normal.  Skin: Skin is warm and dry. No rash noted. No erythema.  Psychiatric: She has a normal mood and affect. Her behavior is normal. Judgment and thought content normal.    Assessment and Plan

## 2013-08-16 NOTE — Assessment & Plan Note (Signed)
Currently with no symptoms of angina. No further workup at this time. Continue current medication regimen. 

## 2013-08-16 NOTE — Assessment & Plan Note (Signed)
Cholesterol is at goal on the current lipid regimen. No changes to the medications were made.  

## 2013-08-16 NOTE — Patient Instructions (Signed)
You are doing well. No medication changes were made.  We will try to give you a flu shot today  Please call us if you have new issues that need to be addressed before your next appt.  Your physician wants you to follow-up in: 6 months.  You will receive a reminder letter in the mail two months in advance. If you don't receive a letter, please call our office to schedule the follow-up appointment.

## 2014-01-14 ENCOUNTER — Telehealth: Payer: Self-pay

## 2014-01-14 NOTE — Telephone Encounter (Signed)
Pt nephew, also healthcare POA, called just to let Dr. Mariah MillingGollan know that pt is currently in route, via EMS, to [facility] due to CP. States pt has taken 3 Nitro and CP has not subsided. He just wanted to let us know in case [facility] contact us for information. He states that he would like to get her transferred to [place] so Dr Mariah MillingGollan can treat her.

## 2014-01-15 ENCOUNTER — Inpatient Hospital Stay (HOSPITAL_COMMUNITY)
Admission: AD | Admit: 2014-01-15 | Discharge: 2014-01-21 | DRG: 280 | Disposition: A | Discharge status: Home / Self Care | Payer: Medicare Other | Source: Other Acute Inpatient Hospital | Attending: Cardiology | Admitting: Cardiology

## 2014-01-15 ENCOUNTER — Encounter (HOSPITAL_COMMUNITY): Payer: Self-pay | Admitting: *Deleted

## 2014-01-15 DIAGNOSIS — Z681 Body mass index (BMI) 19 or less, adult: Secondary | ICD-10-CM

## 2014-01-15 DIAGNOSIS — I079 Rheumatic tricuspid valve disease, unspecified: Secondary | ICD-10-CM | POA: Diagnosis present

## 2014-01-15 DIAGNOSIS — I10 Essential (primary) hypertension: Secondary | ICD-10-CM | POA: Diagnosis present

## 2014-01-15 DIAGNOSIS — Z951 Presence of aortocoronary bypass graft: Secondary | ICD-10-CM

## 2014-01-15 DIAGNOSIS — E43 Unspecified severe protein-calorie malnutrition: Secondary | ICD-10-CM | POA: Diagnosis present

## 2014-01-15 DIAGNOSIS — D649 Anemia, unspecified: Secondary | ICD-10-CM | POA: Diagnosis not present

## 2014-01-15 DIAGNOSIS — I25810 Atherosclerosis of coronary artery bypass graft(s) without angina pectoris: Secondary | ICD-10-CM | POA: Diagnosis present

## 2014-01-15 DIAGNOSIS — Z885 Allergy status to narcotic agent status: Secondary | ICD-10-CM

## 2014-01-15 DIAGNOSIS — Z7982 Long term (current) use of aspirin: Secondary | ICD-10-CM

## 2014-01-15 DIAGNOSIS — Z952 Presence of prosthetic heart valve: Secondary | ICD-10-CM

## 2014-01-15 DIAGNOSIS — Z880 Allergy status to penicillin: Secondary | ICD-10-CM

## 2014-01-15 DIAGNOSIS — Z7901 Long term (current) use of anticoagulants: Secondary | ICD-10-CM

## 2014-01-15 DIAGNOSIS — I214 Non-ST elevation (NSTEMI) myocardial infarction: Principal | ICD-10-CM | POA: Diagnosis present

## 2014-01-15 DIAGNOSIS — E782 Mixed hyperlipidemia: Secondary | ICD-10-CM | POA: Diagnosis present

## 2014-01-15 DIAGNOSIS — Z66 Do not resuscitate: Secondary | ICD-10-CM | POA: Diagnosis present

## 2014-01-15 DIAGNOSIS — Z954 Presence of other heart-valve replacement: Secondary | ICD-10-CM

## 2014-01-15 DIAGNOSIS — I2510 Atherosclerotic heart disease of native coronary artery without angina pectoris: Secondary | ICD-10-CM | POA: Diagnosis present

## 2014-01-15 DIAGNOSIS — E785 Hyperlipidemia, unspecified: Secondary | ICD-10-CM | POA: Diagnosis present

## 2014-01-15 DIAGNOSIS — E119 Type 2 diabetes mellitus without complications: Secondary | ICD-10-CM | POA: Diagnosis present

## 2014-01-15 DIAGNOSIS — I2582 Chronic total occlusion of coronary artery: Secondary | ICD-10-CM | POA: Diagnosis present

## 2014-01-15 DIAGNOSIS — Z9889 Other specified postprocedural states: Secondary | ICD-10-CM

## 2014-01-15 HISTORY — DX: Type 2 diabetes mellitus without complications: E11.9

## 2014-01-15 HISTORY — DX: Non-ST elevation (NSTEMI) myocardial infarction: I21.4

## 2014-01-15 LAB — GLUCOSE, CAPILLARY
Glucose-Capillary: 168 mg/dL — ABNORMAL HIGH (ref 70–99)
Glucose-Capillary: 169 mg/dL — ABNORMAL HIGH (ref 70–99)

## 2014-01-15 LAB — MAGNESIUM: Magnesium: 2 mg/dL (ref 1.5–2.5)

## 2014-01-15 LAB — PROTIME-INR
INR: 2.12 — ABNORMAL HIGH (ref 0.00–1.49)
Prothrombin Time: 23.1 seconds — ABNORMAL HIGH (ref 11.6–15.2)

## 2014-01-15 LAB — MRSA PCR SCREENING: MRSA by PCR: NEGATIVE

## 2014-01-15 LAB — TROPONIN I: TROPONIN I: 9.55 ng/mL — AB (ref <0.30)

## 2014-01-15 MED ORDER — MAGNESIUM HYDROXIDE 400 MG/5ML PO SUSP
15.0000 mL | Freq: Every day | ORAL | Status: DC
  Administered 2014-01-15 – 2014-01-20 (×5): 15 mL via ORAL
  Filled 2014-01-15 (×9): qty 30

## 2014-01-15 MED ORDER — NITROGLYCERIN 2 % TD OINT
0.5000 [in_us] | TOPICAL_OINTMENT | Freq: Four times a day (QID) | TRANSDERMAL | Status: DC
  Administered 2014-01-15 – 2014-01-18 (×11): 0.5 [in_us] via TOPICAL
  Filled 2014-01-15 (×2): qty 30

## 2014-01-15 MED ORDER — GUAIFENESIN ER 600 MG PO TB12
600.0000 mg | ORAL_TABLET | Freq: Two times a day (BID) | ORAL | Status: DC | PRN
  Administered 2014-01-21: 600 mg via ORAL
  Filled 2014-01-15: qty 1

## 2014-01-15 MED ORDER — ATORVASTATIN CALCIUM 40 MG PO TABS
40.0000 mg | ORAL_TABLET | Freq: Every day | ORAL | Status: DC
  Administered 2014-01-15 – 2014-01-20 (×6): 40 mg via ORAL
  Filled 2014-01-15 (×7): qty 1

## 2014-01-15 MED ORDER — SODIUM CHLORIDE 0.9 % IV SOLN
INTRAVENOUS | Status: DC
  Administered 2014-01-15: 10 mL via INTRAVENOUS

## 2014-01-15 MED ORDER — AMLODIPINE BESY-BENAZEPRIL HCL 5-20 MG PO CAPS: 1.0000 | ORAL_CAPSULE | Freq: Every day | ORAL | Status: DC

## 2014-01-15 MED ORDER — METOPROLOL SUCCINATE ER 50 MG PO TB24
75.0000 mg | ORAL_TABLET | Freq: Every day | ORAL | Status: DC
  Administered 2014-01-16 – 2014-01-21 (×6): 75 mg via ORAL
  Filled 2014-01-15 (×9): qty 1

## 2014-01-15 MED ORDER — ASPIRIN EC 81 MG PO TBEC: 81.0000 mg | DELAYED_RELEASE_TABLET | Freq: Every day | ORAL | Status: DC

## 2014-01-15 MED ORDER — BENAZEPRIL HCL 20 MG PO TABS
20.0000 mg | ORAL_TABLET | Freq: Every day | ORAL | Status: DC
  Administered 2014-01-16 – 2014-01-21 (×5): 20 mg via ORAL
  Filled 2014-01-15 (×7): qty 1

## 2014-01-15 MED ORDER — HEPARIN (PORCINE) IN NACL 100-0.45 UNIT/ML-% IJ SOLN
450.0000 [IU]/h | INTRAMUSCULAR | Status: DC
  Administered 2014-01-15: 550 [IU]/h via INTRAVENOUS
  Administered 2014-01-16: 450 [IU]/h via INTRAVENOUS
  Administered 2014-01-16: 500 [IU]/h via INTRAVENOUS
  Filled 2014-01-15 (×2): qty 250

## 2014-01-15 MED ORDER — AMLODIPINE BESYLATE 5 MG PO TABS
5.0000 mg | ORAL_TABLET | Freq: Every day | ORAL | Status: DC
  Administered 2014-01-16 – 2014-01-21 (×5): 5 mg via ORAL
  Filled 2014-01-15 (×7): qty 1

## 2014-01-15 MED ORDER — NITROGLYCERIN 0.4 MG SL SUBL: 0.4000 mg | SUBLINGUAL_TABLET | SUBLINGUAL | Status: DC | PRN

## 2014-01-15 MED ORDER — INSULIN ASPART 100 UNIT/ML ~~LOC~~ SOLN
0.0000 [IU] | Freq: Three times a day (TID) | SUBCUTANEOUS | Status: DC
  Administered 2014-01-15: 2 [IU] via SUBCUTANEOUS
  Administered 2014-01-16: 1 [IU] via SUBCUTANEOUS
  Administered 2014-01-16: 2 [IU] via SUBCUTANEOUS
  Administered 2014-01-16: 3 [IU] via SUBCUTANEOUS

## 2014-01-15 MED ORDER — ASPIRIN 300 MG RE SUPP
300.0000 mg | RECTAL | Status: AC
  Filled 2014-01-15: qty 1

## 2014-01-15 MED ORDER — ASPIRIN 81 MG PO CHEW: 324.0000 mg | CHEWABLE_TABLET | ORAL | Status: AC

## 2014-01-15 MED ORDER — ASPIRIN EC 81 MG PO TBEC
81.0000 mg | DELAYED_RELEASE_TABLET | Freq: Every day | ORAL | Status: DC
  Administered 2014-01-16: 81 mg via ORAL
  Filled 2014-01-15 (×2): qty 1

## 2014-01-15 NOTE — Progress Notes (Addendum)
MD returned called; acknowledged pt statement regarding prior medication administration, per MD scheduled BP meds for day shift should be held.  Nurse will continue to order   Pt/pt family informed nurse that [facility].  Nurse held scheduled BP meds and contacted MD service for further instruction.

## 2014-01-15 NOTE — Progress Notes (Signed)
Date of notification: 01/15/2014  Time of notification:  21:50  Critical value read back:Troponin 9.55  Nurse who received alert:  Joel Cowin L. Margorie Johnusterman RN  MD notified (1st page):  Dr. Zachery ConchFriedman  Time of first page: 21:50  MD notified (2nd page):  Time of second page:  Responding MD:  Dr. Zachery ConchFriedman  Time MD responded:  2150

## 2014-01-15 NOTE — H&P (Addendum)
Pt. Seen and examined. Agree with the NP/PA-C note as written.  80 yo frail-appearing female with a history of ATS MVR and CABG x 4 in 2004, [place]. [place]oll[place]. She had acute onset rest pain in the mid-sternum today - she felt clammy, but not nauseated or dyspneic. She took nitroglycerin without relief (may have been expired).  Nitro from EMS provided relief. Troponin T was elevated at the OSH to 2.0, with elevated CKMB.  She is currently chest pain free.  Impression: 1.  NSTEMI 2.  CAD s/p 4 vessel CABG in 2004, ATS MVR 3.  Therapeutically anticoagulated for mechanical MVR 4.  DM2 5.  HTN  PLAN: 1.  Start heparin and hold warfarin for anticipated LHC on Monday. 2.  Continue current cardiac medications. 3.  PRN nitrates for chest pain. 4.  Re-cycle cardiac enzymes. 5.  Will try to obtain 2D echo from OSH, otherwise, repeat 6.  DNR  Chrystie NoseKenneth C. Kayanna Mckillop, MD, [facility] Attending Cardiologist [facility]

## 2014-01-15 NOTE — H&P (Signed)
Amanda [name] is an 80 y.o. female.    Primary Cardiologist:Dr. Mariah Milling  PCP:  Junius Roads, MD  Chief Complaint: Transferred from [place], Amanda with NSTEMI  HPI: 77 -year-old woman with a past medical history of coronary artery disease, bypass surgery ( left internal mammary artery to left anterior descending coronary artery, saphenous vein graft to right coronary artery, saphenous vein graft to circumflex)  in 2004 at [facility] with a 27 mm ATS mechanical mitral valve on warfarin, with history of hypertension, hyperlipidemia.  She has done well since that time until Friday.    She woke later than usual and once she was up she washed clothes and while sitting she developed a hard mid sternal chest pain that radiated across chest.  She was clammy but no nausea or SOB.  She took 3 NTG of her own without relief though they may have been old.  EMS was called and she was given another 3 sl NTG with some relief. Toradol in ER relieved the rest of the pain.  She has not had any recurrent pain.    Today Troponin T bumped to 2.0 with CK of 700 and MB 60.79. K+ 3.7, Cr. 0.90, glucose 151.  INR is 2.1 today.  t chol 171, HDL 70 LDL 80  Wbc 6.9 hgb 12.9 hct 38.5  An Echo was done there as well.    Echo from 2012:   Left ventricle: The cavity size was normal. Wall thickness was increased in a pattern of mild LVH. Systolic function was normal. The estimated ejection fraction was in the range of 50% to 55%. Wall motion was normal; there were no regional wall motion abnormalities. Doppler parameters are consistent with abnormal left ventricular relaxation (grade 1 diastolic dysfunction). - Aortic valve: Mild regurgitation. - Mitral valve: A prosthesis was present and functioning normally. The prosthesis had a normal range of motion. The sewing ring appeared normal, had no rocking motion, and showed no evidence of dehiscence. Trivial regurgitation. - Tricuspid valve: Mild  regurgitation  Past Medical History  Diagnosis Date  . Coronary artery disease   . Mitral valve disease   . Hyperlipidemia   . Hypertension   . Diabetes mellitus without complication   . NSTEMI (non-ST elevated myocardial infarction) 01/15/2014    Past Surgical History  Procedure Laterality Date  . Coronary artery bypass graft    . Mitral valve replacement      Family History  Problem Relation Age of Onset  . Coronary artery disease Other   . Diabetes Other   . Stroke Other    Social History:  reports that she has never smoked. Her smokeless tobacco use includes Snuff. She reports that she does not drink alcohol or use illicit drugs.  Allergies:  Allergies  Allergen Reactions  . Codeine     REACTION: makes her "crazy"  . Penicillins Hives  . Tramadol     Hallucinations    Medications Prior to Admission  Medication Sig Dispense Refill  . amLODipine-benazepril (LOTREL) 5-20 MG per capsule Take 1 capsule by mouth daily.  30 capsule  11  . aspirin 81 MG EC tablet Take 81 mg by mouth daily.        Marland Kitchen guaiFENesin (MUCINEX) 600 MG 12 hr tablet Take 600 mg by mouth 2 (two) times daily as needed for cough or to loosen phlegm.      . magnesium hydroxide (MILK OF MAGNESIA) 400 MG/5ML suspension Take  15 mLs by mouth at bedtime.      . metFORMIN (GLUMETZA) 500 MG (MOD) 24 hr tablet Take 500 mg by mouth daily with breakfast.        . metoprolol succinate (TOPROL-XL) 50 MG 24 hr tablet Take 75 mg by mouth daily. Take with or immediately following a meal.      . rosuvastatin (CRESTOR) 20 MG tablet Take 1 tablet (20 mg total) by mouth at bedtime.  30 tablet  11  . VITAMIN D, CHOLECALCIFEROL, PO Take 1 tablet by mouth daily.      Marland Kitchen. warfarin (COUMADIN) 4 MG tablet Take 2-4 mg by mouth daily. Takes 2mg  on Tuesday and 4mg  rest of week        No results found for this or any previous visit (from the past 48 hour(s)). No results found.  ROS: General:no colds or fevers, no weight  changes Skin:no rashes or ulcers HEENT:no blurred vision, no congestion CV:see HPI PUL:see HPI GI:no diarrhea constipation or melena, no indigestion GU:no hematuria, no dysuria but freq urination MS:no joint pain, no claudication Neuro:no syncope, no lightheadedness Endo:+ diabetes, no thyroid disease   Blood pressure 130/58, pulse 79, temperature 98 F (36.7 C), temperature source Oral, resp. rate 22, height 5' (1.524 m), weight 100 lb 14.4 oz (45.768 kg), SpO2 97.00%. PE: General:Pleasant affect, NAD Skin:Warm and dry, brisk capillary refill HEENT:normocephalic, sclera clear, mucus membranes moist Neck:supple, no JVD, no bruits  Heart:S1S2 RRR without murmur, gallup, rub or click Lungs:fine rales in the bases, no rhonchi, or wheezes YQM:VHQIAbd:soft, non tender, + BS, do not palpate liver spleen or masses Ext:no lower ext edema, 2+ pedal pulses, 2+ radial pulses Neuro:alert and oriented, MAE, follows commands, + facial symmetry    Assessment/Plan Principal Problem:  1.  NSTEMI (non-ST elevated myocardial infarction) will continue to monitor troponin and check CKMB,  Pt would like to pursue cardiac cath.  She is pain free on BB and ACE.  Along with statin. Active Problems: 2.   HYPERLIPIDEMIA-MIXED treated 3.   CAD, ARTERY BYPASS GRAFT, 2004, LIMA-LAD; VG-RCA; VG-LCX  4.  Diabetes mellitus will use SSI 5.   Hypertension treated 6. Anticoagulation on coumadin, held will use heparin IV  Pt is DNR.  Plan for cardiac cath on Monday with grafts that are [address] years old.  Park Eye And SurgicenterNGOLD,LAURA R Nurse Practitioner Certified [facility] Pager [phone] or after 5pm or weekends call [phone] 01/15/2014, 4:15 PM

## 2014-01-15 NOTE — Progress Notes (Signed)
ANTICOAGULATION CONSULT NOTE - Initial Consult  Pharmacy Consult for Heparin Indication: Mechanical MV (while warfarin on hold) & r/o ACS  Allergies  Allergen Reactions  . Codeine     REACTION: makes her "crazy"  . Penicillins Hives  . Tramadol     Hallucinations    Patient Measurements: Height: 5' (152.4 cm) Weight: 100 lb 14.4 oz (45.768 kg) IBW/kg (Calculated) : 45.5 Heparin Dosing Weight: 45 kg  Vital Signs: Temp: 98 F (36.7 C) (03/28 1410) Temp src: Oral (03/28 1410) BP: 108/88 mmHg (03/28 1600) Pulse Rate: 79 (03/28 1415)  Labs: No results found for this basename: HGB, HCT, PLT, APTT, LABPROT, INR, HEPARINUNFRC, CREATININE, CKTOTAL, CKMB, TROPONINI,  in the last 72 hours  CrCl is unknown because no creatinine reading has been taken.   Medical History: Past Medical History  Diagnosis Date  . Coronary artery disease   . Mitral valve disease   . Hyperlipidemia   . Hypertension   . Diabetes mellitus without complication   . NSTEMI (non-ST elevated myocardial infarction) 01/15/2014    Assessment: 80 y.o. F who presented with NSTEMI. The patient was on warfarin PTA for mechanical MVR - now held while working up for ACS. PTA warfarin dose was 4 mg daily EXCEPT for 2 mg on Tuesdays only. INR on admission is 2.12 - will initiate heparin with no bolus. Per MD notes - Hgb 12.9, Hct 38.5  Goal of Therapy:  Heparin level 0.3-0.7 units/ml Monitor platelets by anticoagulation protocol: Yes   Plan:  1. Initiate heparin at 550 units/hr (5.5 ml/hr) 2. Daily heparin levels 3. Will continue to monitor for any signs/symptoms of bleeding and will follow up with heparin level in 8 hours   Georgina PillionElizabeth Courtlynn Holloman, PharmD, BCPS Clinical Pharmacist Pager: [phone] 01/15/2014 7:34 PM

## 2014-01-16 LAB — URINE MICROSCOPIC-ADD ON

## 2014-01-16 LAB — CBC
HCT: 37.2 % (ref 36.0–46.0)
HCT: 38.6 % (ref 36.0–46.0)
Hemoglobin: 12.7 g/dL (ref 12.0–15.0)
Hemoglobin: 13.5 g/dL (ref 12.0–15.0)
MCH: 32.3 pg (ref 26.0–34.0)
MCH: 33 pg (ref 26.0–34.0)
MCHC: 34.1 g/dL (ref 30.0–36.0)
MCHC: 35 g/dL (ref 30.0–36.0)
MCV: 94.7 fL (ref 78.0–100.0)
MCV: 94.4 fL (ref 78.0–100.0)
PLATELETS: 258 10*3/uL (ref 150–400)
Platelets: 246 10*3/uL (ref 150–400)
RBC: 3.93 MIL/uL (ref 3.87–5.11)
RBC: 4.09 MIL/uL (ref 3.87–5.11)
RDW: 12.9 % (ref 11.5–15.5)
RDW: 13.2 % (ref 11.5–15.5)
WBC: 6.7 10*3/uL (ref 4.0–10.5)
WBC: 7.2 10*3/uL (ref 4.0–10.5)

## 2014-01-16 LAB — URINALYSIS, ROUTINE W REFLEX MICROSCOPIC
Bilirubin Urine: NEGATIVE
Glucose, UA: NEGATIVE mg/dL
Hgb urine dipstick: NEGATIVE
Ketones, ur: NEGATIVE mg/dL
NITRITE: NEGATIVE
Protein, ur: NEGATIVE mg/dL
Specific Gravity, Urine: 1.013 (ref 1.005–1.030)
UROBILINOGEN UA: 0.2 mg/dL (ref 0.0–1.0)
pH: 5.5 (ref 5.0–8.0)

## 2014-01-16 LAB — BASIC METABOLIC PANEL
BUN: 24 mg/dL — ABNORMAL HIGH (ref 6–23)
CALCIUM: 9.6 mg/dL (ref 8.4–10.5)
CHLORIDE: 101 meq/L (ref 96–112)
CO2: 30 meq/L (ref 19–32)
Creatinine, Ser: 0.9 mg/dL (ref 0.50–1.10)
GFR calc non Af Amer: 56 mL/min — ABNORMAL LOW (ref >90)
GFR, EST AFRICAN AMERICAN: 65 mL/min — AB (ref >90)
Glucose, Bld: 162 mg/dL — ABNORMAL HIGH (ref 70–99)
Potassium: 4.8 mEq/L (ref 3.7–5.3)
SODIUM: 140 meq/L (ref 137–147)

## 2014-01-16 LAB — HEPARIN LEVEL (UNFRACTIONATED)
Heparin Unfractionated: 0.67 IU/mL (ref 0.30–0.70)
Heparin Unfractionated: 0.76 IU/mL — ABNORMAL HIGH (ref 0.30–0.70)

## 2014-01-16 LAB — GLUCOSE, CAPILLARY
Glucose-Capillary: 165 mg/dL — ABNORMAL HIGH (ref 70–99)
Glucose-Capillary: 246 mg/dL — ABNORMAL HIGH (ref 70–99)
Glucose-Capillary: 140 mg/dL — ABNORMAL HIGH (ref 70–99)
Glucose-Capillary: 326 mg/dL — ABNORMAL HIGH (ref 70–99)

## 2014-01-16 LAB — TSH: TSH: 2.451 u[IU]/mL (ref 0.350–4.500)

## 2014-01-16 LAB — HEMOGLOBIN A1C
HEMOGLOBIN A1C: 8 % — AB (ref <5.7)
MEAN PLASMA GLUCOSE: 183 mg/dL — AB (ref <117)

## 2014-01-16 LAB — PROTIME-INR
INR: 2.05 — AB (ref 0.00–1.49)
PROTHROMBIN TIME: 22.5 s — AB (ref 11.6–15.2)

## 2014-01-16 LAB — TROPONIN I
Troponin I: 8.3 ng/mL (ref <0.30)
Troponin I: 8.52 ng/mL (ref <0.30)

## 2014-01-16 LAB — T4, FREE: Free T4: 1.14 ng/dL (ref 0.80–1.80)

## 2014-01-16 MED ORDER — INSULIN ASPART 100 UNIT/ML ~~LOC~~ SOLN: 0.0000 [IU] | Freq: Three times a day (TID) | SUBCUTANEOUS | Status: DC

## 2014-01-16 MED ORDER — SODIUM CHLORIDE 0.9 % IV SOLN
1.0000 mL/kg/h | INTRAVENOUS | Status: DC
  Administered 2014-01-17: 1 mL/kg/h via INTRAVENOUS

## 2014-01-16 MED ORDER — INSULIN ASPART 100 UNIT/ML ~~LOC~~ SOLN
0.0000 [IU] | Freq: Three times a day (TID) | SUBCUTANEOUS | Status: DC
  Administered 2014-01-16: 3 [IU] via SUBCUTANEOUS
  Administered 2014-01-17 – 2014-01-18 (×5): 2 [IU] via SUBCUTANEOUS
  Administered 2014-01-18: 3 [IU] via SUBCUTANEOUS
  Administered 2014-01-18: 7 [IU] via SUBCUTANEOUS
  Administered 2014-01-19: 3 [IU] via SUBCUTANEOUS
  Administered 2014-01-19: 1 [IU] via SUBCUTANEOUS
  Administered 2014-01-19: 3 [IU] via SUBCUTANEOUS
  Administered 2014-01-19: 5 [IU] via SUBCUTANEOUS
  Administered 2014-01-20: 1 [IU] via SUBCUTANEOUS
  Administered 2014-01-20 (×2): 3 [IU] via SUBCUTANEOUS
  Administered 2014-01-20 – 2014-01-21 (×2): 2 [IU] via SUBCUTANEOUS
  Administered 2014-01-21: 3 [IU] via SUBCUTANEOUS

## 2014-01-16 MED ORDER — SODIUM CHLORIDE 0.9 % IV SOLN: 250.0000 mL | INTRAVENOUS | Status: DC | PRN

## 2014-01-16 MED ORDER — ASPIRIN EC 81 MG PO TBEC
81.0000 mg | DELAYED_RELEASE_TABLET | Freq: Every day | ORAL | Status: DC
  Administered 2014-01-18 – 2014-01-21 (×4): 81 mg via ORAL
  Filled 2014-01-16 (×4): qty 1

## 2014-01-16 MED ORDER — SODIUM CHLORIDE 0.9 % IJ SOLN
3.0000 mL | Freq: Two times a day (BID) | INTRAMUSCULAR | Status: DC
  Administered 2014-01-16: 3 mL via INTRAVENOUS

## 2014-01-16 MED ORDER — SODIUM CHLORIDE 0.9 % IJ SOLN: 3.0000 mL | INTRAMUSCULAR | Status: DC | PRN

## 2014-01-16 MED ORDER — ASPIRIN 81 MG PO CHEW
81.0000 mg | CHEWABLE_TABLET | ORAL | Status: AC
  Administered 2014-01-17: 81 mg via ORAL
  Filled 2014-01-16: qty 1

## 2014-01-16 NOTE — Progress Notes (Signed)
ANTICOAGULATION CONSULT NOTE - Follow Up Consult  Pharmacy Consult for Heparin  Indication: Mechanical MV (while warfarin on hold) & r/o ACS  Allergies  Allergen Reactions  . Codeine     REACTION: makes her "crazy"  . Penicillins Hives  . Tramadol     Hallucinations    Patient Measurements: Height: 5' (152.4 cm) Weight: 100 lb 14.4 oz (45.768 kg) IBW/kg (Calculated) : 45.5 Heparin Dosing Weight: 45 kg  Vital Signs: Temp: 98 F (36.7 C) (03/29 1500) Temp src: Oral (03/29 1500) BP: 148/60 mmHg (03/29 1600) Pulse Rate: 93 (03/29 1600)  Labs:  Recent Labs  01/15/14 1840 01/16/14 0015 01/16/14 0623 01/16/14 1530  HGB  --  12.7 13.5  --   HCT  --  37.2 38.6  --   PLT  --  246 258  --   LABPROT 23.1*  --  22.5*  --   INR 2.12*  --  2.05*  --   HEPARINUNFRC  --   --  0.67 0.76*  CREATININE  --   --  0.90  --   TROPONINI 9.55* 8.30* 8.52*  --     Estimated Creatinine Clearance: 32.2 ml/min (by C-G formula based on Cr of 0.9).   Medications:  Heparin @ 500 units/hr  Assessment: 80 y.o. F who presented with NSTEMI. The patient was on warfarin PTA for mechanical MVR - now held while working up for ACS and being anticoagulated with heparin. PTA warfarin dose was 4 mg daily EXCEPT for 2 mg on Tuesdays only. INR on admission is 2.12 - heparin initiated without a bolus.  Heparin level this evening remains SUPRAtherapeutic despite a rate decrease this morning (HL 0.76 << 0.67, goal of 0.3-0.7). CBC wnl this a.m. No overt s/sx of bleeding noted.   Goal of Therapy:  Heparin level 0.3-0.7 units/ml Monitor platelets by anticoagulation protocol: Yes   Plan:  1. Reduce heparin drip rate to 450 units/hr (4.5 ml/hr) 2. Will continue to monitor for any signs/symptoms of bleeding and will follow up with heparin level in 8 hours   Georgina PillionElizabeth Shakerria Parran, PharmD, BCPS Clinical Pharmacist Pager: [phone] 01/16/2014 4:50 PM

## 2014-01-16 NOTE — Progress Notes (Signed)
DAILY PROGRESS NOTE  Subjective:  No events overnight. No further chest pain. Troponin I was elevated, but is trending down (9.55->8.3->8.52?).  INR is 2.05 today.  Continuing to hold warfarin for cath.  Objective:  Temp:  [97.8 F (36.6 C)-99 F (37.2 C)] 98.3 F (36.8 C) (03/29 1131) Pulse Rate:  [74-103] 80 (03/29 1131) Resp:  [18-26] 25 (03/29 1131) BP: (103-146)/(49-90) 108/49 mmHg (03/29 1131) SpO2:  [92 %-98 %] 95 % (03/29 1131) Weight:  [100 lb 14.4 oz (45.768 kg)] 100 lb 14.4 oz (45.768 kg) (03/29 0320) Weight change:   Intake/Output from previous day: 03/28 0701 - 03/29 0700 In: 169.8 [I.V.:169.8] Out: 300 [Urine:300]  Intake/Output from this shift: Total I/O In: 5 [I.V.:5] Out: -   Medications: Current Facility-Administered Medications  Medication Dose Route Frequency Provider Last Rate Last Dose  . 0.9 %  sodium chloride infusion   Intravenous Continuous Cecilie Kicks, NP 10 mL/hr at 01/16/14 0400    . amLODipine (NORVASC) tablet 5 mg  5 mg Oral Daily Dorothy Spark, MD   5 mg at 01/16/14 0957   And  . benazepril (LOTENSIN) tablet 20 mg  20 mg Oral Daily Dorothy Spark, MD   20 mg at 01/16/14 0956  . aspirin chewable tablet 324 mg  324 mg Oral NOW Cecilie Kicks, NP       Or  . aspirin suppository 300 mg  300 mg Rectal NOW Cecilie Kicks, NP      . aspirin EC tablet 81 mg  81 mg Oral Daily Cecilie Kicks, NP   81 mg at 01/16/14 0956  . atorvastatin (LIPITOR) tablet 40 mg  40 mg Oral q1800 Cecilie Kicks, NP   40 mg at 01/15/14 1725  . guaiFENesin (MUCINEX) 12 hr tablet 600 mg  600 mg Oral BID PRN Cecilie Kicks, NP      . heparin ADULT infusion 100 units/mL (25000 units/250 mL)  500 Units/hr Intravenous Continuous Dorothy Spark, MD 5 mL/hr at 01/16/14 0709 500 Units/hr at 01/16/14 0709  . insulin aspart (novoLOG) injection 0-9 Units  0-9 Units Subcutaneous TID WC Cecilie Kicks, NP   2 Units at 01/16/14 0749  . magnesium hydroxide (MILK OF MAGNESIA)  suspension 15 mL  15 mL Oral QHS Cecilie Kicks, NP   15 mL at 01/15/14 2222  . metoprolol succinate (TOPROL-XL) 24 hr tablet 75 mg  75 mg Oral QAC breakfast Cecilie Kicks, NP   75 mg at 01/16/14 0751  . nitroGLYCERIN (NITROGLYN) 2 % ointment 0.5 inch  0.5 inch Topical 4 times per day Cecilie Kicks, NP   0.5 inch at 01/16/14 0640  . nitroGLYCERIN (NITROSTAT) SL tablet 0.4 mg  0.4 mg Sublingual Q5 Min x 3 PRN Cecilie Kicks, NP        Physical Exam: General appearance: alert and no distress Neck: no carotid bruit and no JVD Lungs: clear to auscultation bilaterally Heart: regular rate and rhythm Abdomen: soft, non-tender; bowel sounds normal; no masses,  no organomegaly Extremities: extremities normal, atraumatic, no cyanosis or edema Pulses: 2+ and symmetric Skin: Skin color, texture, turgor normal. No rashes or lesions Neurologic: Grossly normal .  Lab Results: Results for orders placed during the hospital encounter of 01/15/14 (from the past 48 hour(s))  MRSA PCR SCREENING     Status: None   Collection Time    01/15/14  2:36 PM      Result Value Ref Range   MRSA by PCR NEGATIVE  NEGATIVE  Comment:            The GeneXpert MRSA Assay (FDA     approved for NASAL specimens     only), is one component of a     comprehensive MRSA colonization     surveillance program. It is not     intended to diagnose MRSA     infection nor to guide or     monitor treatment for     MRSA infections.  GLUCOSE, CAPILLARY     Status: Abnormal   Collection Time    01/15/14  5:09 PM      Result Value Ref Range   Glucose-Capillary 168 (*) 70 - 99 mg/dL  TROPONIN I     Status: Abnormal   Collection Time    01/15/14  6:40 PM      Result Value Ref Range   Troponin I 9.55 (*) <0.30 ng/mL   Comment:            Due to the release kinetics of cTnI,     a negative result within the first hours     of the onset of symptoms does not rule out     myocardial infarction with certainty.     If myocardial  infarction is still suspected,     repeat the test at appropriate intervals.     CRITICAL RESULT CALLED TO, READ BACK BY AND VERIFIED WITH:     OUSTERMAN,R RN 01/15/14 1955 [place]  PROTIME-INR     Status: Abnormal   Collection Time    01/15/14  6:40 PM      Result Value Ref Range   Prothrombin Time 23.1 (*) 11.6 - 15.2 seconds   INR 2.12 (*) 0.00 - 1.49  TSH     Status: None   Collection Time    01/15/14  6:40 PM      Result Value Ref Range   TSH 2.451  0.350 - 4.500 uIU/mL   Comment: Performed at [facility]  T4, FREE     Status: None   Collection Time    01/15/14  6:40 PM      Result Value Ref Range   Free T4 1.14  0.80 - 1.80 ng/dL   Comment: Performed at [place]     Status: None   Collection Time    01/15/14  6:40 PM      Result Value Ref Range   Magnesium 2.0  1.5 - 2.5 mg/dL  HEMOGLOBIN A1C     Status: Abnormal   Collection Time    01/15/14  6:40 PM      Result Value Ref Range   Hemoglobin A1C 8.0 (*) <5.7 %   Comment: (NOTE)                                                                               According to the ADA Clinical Practice Recommendations for 2011, when     HbA1c is used as a screening test:      >=6.5%   Diagnostic of Diabetes Mellitus               (if abnormal result is confirmed)  5.7-6.4%   Increased risk of developing Diabetes Mellitus     References:Diagnosis and Classification of Diabetes Mellitus,Diabetes     ESPQ,3300,76(AUQJF 1):S62-S69 and Standards of Medical Care in             Diabetes - 2011,Diabetes HLKT,6256,38 (Suppl 1):S11-S61.   Mean Plasma Glucose 183 (*) <117 mg/dL   Comment: Performed at [place], CAPILLARY     Status: Abnormal   Collection Time    01/15/14  9:52 PM      Result Value Ref Range   Glucose-Capillary 169 (*) 70 - 99 mg/dL  CBC     Status: None   Collection Time    01/16/14 12:15 AM      Result Value Ref Range   WBC 6.7  4.0 - 10.5 K/uL   RBC 3.93   3.87 - 5.11 MIL/uL   Hemoglobin 12.7  12.0 - 15.0 g/dL   HCT 37.2  36.0 - 46.0 %   MCV 94.7  78.0 - 100.0 fL   MCH 32.3  26.0 - 34.0 pg   MCHC 34.1  30.0 - 36.0 g/dL   RDW 12.9  11.5 - 15.5 %   Platelets 246  150 - 400 K/uL  TROPONIN I     Status: Abnormal   Collection Time    01/16/14 12:15 AM      Result Value Ref Range   Troponin I 8.30 (*) <0.30 ng/mL   Comment:            Due to the release kinetics of cTnI,     a negative result within the first hours     of the onset of symptoms does not rule out     myocardial infarction with certainty.     If myocardial infarction is still suspected,     repeat the test at appropriate intervals.     CRITICAL VALUE NOTED.  VALUE IS CONSISTENT WITH PREVIOUSLY REPORTED AND CALLED VALUE.  TROPONIN I     Status: Abnormal   Collection Time    01/16/14  6:23 AM      Result Value Ref Range   Troponin I 8.52 (*) <0.30 ng/mL   Comment:            Due to the release kinetics of cTnI,     a negative result within the first hours     of the onset of symptoms does not rule out     myocardial infarction with certainty.     If myocardial infarction is still suspected,     repeat the test at appropriate intervals.     CRITICAL VALUE NOTED.  VALUE IS CONSISTENT WITH PREVIOUSLY REPORTED AND CALLED VALUE.  CBC     Status: None   Collection Time    01/16/14  6:23 AM      Result Value Ref Range   WBC 7.2  4.0 - 10.5 K/uL   RBC 4.09  3.87 - 5.11 MIL/uL   Hemoglobin 13.5  12.0 - 15.0 g/dL   HCT 38.6  36.0 - 46.0 %   MCV 94.4  78.0 - 100.0 fL   MCH 33.0  26.0 - 34.0 pg   MCHC 35.0  30.0 - 36.0 g/dL   RDW 13.2  11.5 - 15.5 %   Platelets 258  150 - 400 K/uL  BASIC METABOLIC PANEL     Status: Abnormal   Collection Time    01/16/14  6:23 AM  Result Value Ref Range   Sodium 140  137 - 147 mEq/L   Potassium 4.8  3.7 - 5.3 mEq/L   Chloride 101  96 - 112 mEq/L   CO2 30  19 - 32 mEq/L   Glucose, Bld 162 (*) 70 - 99 mg/dL   BUN 24 (*) 6 - 23 mg/dL     Creatinine, Ser 0.90  0.50 - 1.10 mg/dL   Calcium 9.6  8.4 - 10.5 mg/dL   GFR calc non Af Amer 56 (*) >90 mL/min   GFR calc Af Amer 65 (*) >90 mL/min   Comment: (NOTE)     The eGFR has been calculated using the CKD EPI equation.     This calculation has not been validated in all clinical situations.     eGFR's persistently <90 mL/min signify possible Chronic Kidney     Disease.  PROTIME-INR     Status: Abnormal   Collection Time    01/16/14  6:23 AM      Result Value Ref Range   Prothrombin Time 22.5 (*) 11.6 - 15.2 seconds   INR 2.05 (*) 0.00 - 1.49  HEPARIN LEVEL (UNFRACTIONATED)     Status: None   Collection Time    01/16/14  6:23 AM      Result Value Ref Range   Heparin Unfractionated 0.67  0.30 - 0.70 IU/mL   Comment:            IF HEPARIN RESULTS ARE BELOW     EXPECTED VALUES, AND PATIENT     DOSAGE HAS BEEN CONFIRMED,     SUGGEST FOLLOW UP TESTING     OF ANTITHROMBIN III LEVELS.  URINALYSIS, ROUTINE W REFLEX MICROSCOPIC     Status: Abnormal   Collection Time    01/16/14  6:36 AM      Result Value Ref Range   Color, Urine YELLOW  YELLOW   APPearance CLEAR  CLEAR   Specific Gravity, Urine 1.013  1.005 - 1.030   pH 5.5  5.0 - 8.0   Glucose, UA NEGATIVE  NEGATIVE mg/dL   Hgb urine dipstick NEGATIVE  NEGATIVE   Bilirubin Urine NEGATIVE  NEGATIVE   Ketones, ur NEGATIVE  NEGATIVE mg/dL   Protein, ur NEGATIVE  NEGATIVE mg/dL   Urobilinogen, UA 0.2  0.0 - 1.0 mg/dL   Nitrite NEGATIVE  NEGATIVE   Leukocytes, UA TRACE (*) NEGATIVE  URINE MICROSCOPIC-ADD ON     Status: Abnormal   Collection Time    01/16/14  6:36 AM      Result Value Ref Range   Squamous Epithelial / LPF FEW (*) RARE   WBC, UA 0-2  <3 WBC/hpf   Bacteria, UA RARE  RARE  GLUCOSE, CAPILLARY     Status: Abnormal   Collection Time    01/16/14  7:42 AM      Result Value Ref Range   Glucose-Capillary 165 (*) 70 - 99 mg/dL    Imaging: No results found.  Assessment:  1. Principal Problem: 2.    NSTEMI (non-ST elevated myocardial infarction) 3. Active Problems: 4.   HYPERLIPIDEMIA-MIXED 5.   CAD, ARTERY BYPASS GRAFT, 2004, LIMA-LAD; VG-RCA; VG-LCX 6.   MITRAL VALVE REPLACEMENT, HX OF, 2004, 27 mmmechanical valve 7.   Diabetes mellitus 8.   Hypertension 9.   Plan:  1. No further chest pain - cardiac enzymes are trending down. Check 2D echo for EF, WMA's.  Plan for LHC, possibly tomorrow, however, INR may not be low enough.  May be Tuesday. Keep NPO p MN in case. Would not reverse INR due to her mechanical valve and theoretically increased risk of thrombosis.   Time Spent Directly with Patient:  15 minutes  Length of Stay:  LOS: 1 day   Pixie Casino, MD, [facility] Attending Cardiologist [facility]  Leonel Mccollum C 01/16/2014, 11:57 AM

## 2014-01-16 NOTE — Progress Notes (Signed)
[facility].

## 2014-01-16 NOTE — Progress Notes (Signed)
[facility] repaged.

## 2014-01-16 NOTE — Progress Notes (Signed)
ANTICOAGULATION CONSULT NOTE  Pharmacy Consult for Heparin Indication: Mechanical MV (while warfarin on hold) & r/o ACS  Allergies  Allergen Reactions  . Codeine     REACTION: makes her "crazy"  . Penicillins Hives  . Tramadol     Hallucinations    Patient Measurements: Height: 5' (152.4 cm) Weight: 100 lb 14.4 oz (45.768 kg) IBW/kg (Calculated) : 45.5 Heparin Dosing Weight: 45 kg  Vital Signs: Temp: 98.7 F (37.1 C) (03/29 0320) Temp src: Oral (03/29 0320) BP: 132/60 mmHg (03/29 0600) Pulse Rate: 77 (03/29 0600)  Labs:  Recent Labs  01/15/14 1840 01/16/14 0015 01/16/14 0623  HGB  --  12.7 13.5  HCT  --  37.2 38.6  PLT  --  246 258  LABPROT 23.1*  --  22.5*  INR 2.12*  --  2.05*  HEPARINUNFRC  --   --  0.67  TROPONINI 9.55* 8.30*  --     CrCl is unknown because no creatinine reading has been taken.   Medical History: Past Medical History  Diagnosis Date  . Coronary artery disease   . Mitral valve disease   . Hyperlipidemia   . Hypertension   . Diabetes mellitus without complication   . NSTEMI (non-ST elevated myocardial infarction) 01/15/2014    Assessment: 80 y.o. F who presented with NSTEMI. The patient was on warfarin PTA for mechanical MVR - now held while working up for ACS. PTA warfarin dose was 4 mg daily EXCEPT for 2 mg on Tuesdays only.  Heparin level this am 0.67 units/ml.  INR 2.05  Goal of Therapy:  Heparin level 0.3-0.7 units/ml Monitor platelets by anticoagulation protocol: Yes   Plan:  1. Decrease heparin to 500 units/hr 2. Check heparin level 8 hours after rate change 3. Will continue to monitor for any signs/symptoms of bleeding  Talbert CageLora Hoyte Ziebell, PharmD Clinical Pharmacist Pager: [phone] 01/16/2014 7:03 AM

## 2014-01-17 ENCOUNTER — Encounter (HOSPITAL_COMMUNITY): Payer: Self-pay | Admitting: Cardiology

## 2014-01-17 ENCOUNTER — Encounter (HOSPITAL_COMMUNITY)
Admission: AD | Disposition: A | Discharge status: Home / Self Care | Payer: Self-pay | Source: Other Acute Inpatient Hospital | Attending: Cardiology

## 2014-01-17 DIAGNOSIS — I2510 Atherosclerotic heart disease of native coronary artery without angina pectoris: Secondary | ICD-10-CM

## 2014-01-17 DIAGNOSIS — I214 Non-ST elevation (NSTEMI) myocardial infarction: Secondary | ICD-10-CM

## 2014-01-17 HISTORY — PX: LEFT HEART CATHETERIZATION WITH CORONARY ANGIOGRAM: SHX5451

## 2014-01-17 LAB — CBC
HEMATOCRIT: 36 % (ref 36.0–46.0)
Hemoglobin: 12.4 g/dL (ref 12.0–15.0)
MCH: 32.7 pg (ref 26.0–34.0)
MCHC: 34.4 g/dL (ref 30.0–36.0)
MCV: 95 fL (ref 78.0–100.0)
Platelets: 261 10*3/uL (ref 150–400)
RBC: 3.79 MIL/uL — ABNORMAL LOW (ref 3.87–5.11)
RDW: 13.2 % (ref 11.5–15.5)
WBC: 6.5 10*3/uL (ref 4.0–10.5)

## 2014-01-17 LAB — BASIC METABOLIC PANEL
BUN: 21 mg/dL (ref 6–23)
CALCIUM: 9.1 mg/dL (ref 8.4–10.5)
CO2: 29 meq/L (ref 19–32)
Chloride: 104 mEq/L (ref 96–112)
Creatinine, Ser: 0.69 mg/dL (ref 0.50–1.10)
GFR calc Af Amer: 89 mL/min — ABNORMAL LOW (ref >90)
GFR calc non Af Amer: 77 mL/min — ABNORMAL LOW (ref >90)
GLUCOSE: 78 mg/dL (ref 70–99)
Potassium: 4 mEq/L (ref 3.7–5.3)
SODIUM: 144 meq/L (ref 137–147)

## 2014-01-17 LAB — HEPARIN LEVEL (UNFRACTIONATED)
Heparin Unfractionated: 0.35 IU/mL (ref 0.30–0.70)
Heparin Unfractionated: <0.1 IU/mL — ABNORMAL LOW (ref 0.30–0.70)

## 2014-01-17 LAB — GLUCOSE, CAPILLARY
Glucose-Capillary: 233 mg/dL — ABNORMAL HIGH (ref 70–99)
Glucose-Capillary: 165 mg/dL — ABNORMAL HIGH (ref 70–99)
Glucose-Capillary: 167 mg/dL — ABNORMAL HIGH (ref 70–99)
Glucose-Capillary: 186 mg/dL — ABNORMAL HIGH (ref 70–99)

## 2014-01-17 LAB — POCT ACTIVATED CLOTTING TIME: Activated Clotting Time: 121 seconds

## 2014-01-17 LAB — PROTIME-INR
INR: 1.61 — ABNORMAL HIGH (ref 0.00–1.49)
Prothrombin Time: 18.7 seconds — ABNORMAL HIGH (ref 11.6–15.2)

## 2014-01-17 SURGERY — LEFT HEART CATHETERIZATION WITH CORONARY ANGIOGRAM
Anesthesia: LOCAL

## 2014-01-17 MED ORDER — HEPARIN (PORCINE) IN NACL 2-0.9 UNIT/ML-% IJ SOLN
INTRAMUSCULAR | Status: AC
  Filled 2014-01-17: qty 1000

## 2014-01-17 MED ORDER — LIDOCAINE HCL (PF) 1 % IJ SOLN
INTRAMUSCULAR | Status: AC
Start: 2014-01-17 — End: 2014-01-17
  Filled 2014-01-17: qty 30

## 2014-01-17 MED ORDER — WARFARIN - PHARMACIST DOSING INPATIENT
Freq: Every day | Status: DC
  Administered 2014-01-17 – 2014-01-18 (×2)

## 2014-01-17 MED ORDER — WARFARIN VIDEO
1.0000 | Freq: Once | Status: AC
  Administered 2014-01-18: 1

## 2014-01-17 MED ORDER — SODIUM CHLORIDE 0.9 % IV SOLN
INTRAVENOUS | Status: DC
  Administered 2014-01-17: 14:00:00 via INTRAVENOUS

## 2014-01-17 MED ORDER — NITROGLYCERIN 0.2 MG/ML ON CALL CATH LAB
INTRAVENOUS | Status: AC
  Filled 2014-01-17: qty 1

## 2014-01-17 MED ORDER — HEPARIN (PORCINE) IN NACL 100-0.45 UNIT/ML-% IJ SOLN
700.0000 [IU]/h | INTRAMUSCULAR | Status: DC
  Administered 2014-01-17: 450 [IU]/h via INTRAVENOUS
  Administered 2014-01-18 (×2): 500 [IU]/h via INTRAVENOUS
  Administered 2014-01-19: 900 [IU]/h via INTRAVENOUS
  Administered 2014-01-20: 850 [IU]/h via INTRAVENOUS
  Filled 2014-01-17 (×4): qty 250

## 2014-01-17 MED ORDER — ASPIRIN EC 81 MG PO TBEC: 81.0000 mg | DELAYED_RELEASE_TABLET | Freq: Every day | ORAL | Status: DC

## 2014-01-17 MED ORDER — WARFARIN SODIUM 5 MG PO TABS
5.0000 mg | ORAL_TABLET | Freq: Once | ORAL | Status: AC
  Administered 2014-01-17: 5 mg via ORAL
  Filled 2014-01-17: qty 1

## 2014-01-17 MED ORDER — MIDAZOLAM HCL 2 MG/2ML IJ SOLN
INTRAMUSCULAR | Status: AC
  Filled 2014-01-17: qty 2

## 2014-01-17 MED ORDER — COUMADIN BOOK
1.0000 | Freq: Once | Status: AC
  Administered 2014-01-17: 1
  Filled 2014-01-17: qty 1

## 2014-01-17 NOTE — CV Procedure (Signed)
Amanda [name] is a 80 y.o. female    [id number]  [id number] LOCATION:  FACILITY: [facility]  PHYSICIAN: Lennette Biharihomas A. Meshia Rau, MD, [employer] 02/23/1925   DATE OF PROCEDURE:  01/17/2014    CARDIAC CATHETERIZATION     HISTORY:  Amanda [name] is an 80 year old female who is status post ATS mitral valve replacement and CABG surgery x4 in 2004 followed by Dr. Orpah Clintonollin. She was admitted to the hospital with acute onset of rest pain in the midsternum associated with diaphoresis. Troponin was mildly elevated at 2.0 with an elevated CK-MB. Her Coumadin has been held. INR today was 1.6. She now presents for cardiac catheterization per   PROCEDURE:  The patient was brought to the second floor [place] [facility] cath lab in the postabsorptive state. She was premedicated with Versed 1 mg. Right femoral artery was punctured anteriorly and a 5 French sheath was inserted without difficulty. The patient's aorta was significantly tortuous particularly the thoracic cavity appeared to traverse to the right side of the spine and curved leftward at the aortic knob. Initially in [place] 4 diagnostic catheter was inserted but this was unsuccessful to cannulate the left main. The right catheter JR 4 was inserted and selective angiography) artery was performed. This catheter was also used for selective angiography into the vein graft supplying the RCA and the vein graft supplying the circumflex vessel. An SL 3.5 diagnostic catheter was then inserted and the LM was finally cannulated. A catheter was then inserted through the significant calcification in the proximal subclavian. A wire was advanced into the subclavian but the catheter was never able to pass beyond the calcified proximal segment. Angiography was performed from here with adequate visualization of the LIMA graft supplying the LAD. This 5 French pigtail catheter was used for RAO ventriculography. Hemostasis was obtained by direct pressure.  HEMODYNAMICS:   Central Aorta:  170/68   Left Ventricle: 170/23  ANGIOGRAPHY:  1. Left main:  There was a99% eccentric stenosis and then was totally occluded. The LAD and circumflex which had been visualized prior to CABG surgery when no longer visualized. antegrade. 2. LAD: Totally occluded at its origin 3. Left circumflex: Totally occluded at its origin.  4. Right coronary artery: 90% mid stenosis 5.LIMA TO LAD: Calcified proximal subclavian artery with patent LIMA flow to the LAD. The LAD was small caliber distally but appeared to be free of significant disease. 6. SVG TO circumflex marginal vessel: Widely patent. The circumflex filled retrograde up to the near ostial segment. The cervix marginal beyond the anastomosis was small caliber and had a focal 70-80% stenosis. 7.  SVG TO RCA: Widely patent and anastomose into the distal LAD. There was excellent filling of the PDA and posterolateral vessels without stenosis     Left ventriculography revealed an ejection fraction of approximately 45%. Was mild distal anterolateral hypocontractility. There is a well-seated mechanical mitral valve with good leaflet mobility There was no mitral regurgitation.  IMPRESSION:  Mild LV dysfunction with an ejection fraction of 45% with distal anterolateral hypocontractility  Well-seated ATS mitral valve replacement without evidence for regurgitation and with good disc mobility  Severe native coronary obstructive disease with 99% left main stenosis and total of LAD circumflex occlusion, and 90% mid RCA stenosis.  Patent LIMA to LAD but with evidence for calcification of the proximal subclavian with narrowing of at least 30%.  Patent status vein graft to the circumflex marginal vessel with 70-80% stenosis in a small marginal vessel beyond the anastomosis  Patent saphenous  vein graft to the distal right coronary artery  RECOMMENDATION:  Increase medical therapy. I suspect that the patient recent symptoms and mild enzyme leak may be  related to proximal disease not supplied by grafts in this patient with an occluded distal left main.   Lennette Bihari, MD, [facility] 01/17/2014 8:12 PM

## 2014-01-17 NOTE — Progress Notes (Signed)
ANTICOAGULATION CONSULT NOTE - Follow Up Consult  Pharmacy Consult for heparin Indication: MVR  Labs:  Recent Labs  01/15/14 1840  01/16/14 0015 01/16/14 0623 01/16/14 1530 01/17/14 0130  HGB  --   < > 12.7 13.5  --  12.4  HCT  --   --  37.2 38.6  --  36.0  PLT  --   --  246 258  --  261  LABPROT 23.1*  --   --  22.5*  --  18.7*  INR 2.12*  --   --  2.05*  --  1.61*  HEPARINUNFRC  --   --   --  0.67 0.76* 0.35  CREATININE  --   --   --  0.90  --   --   TROPONINI 9.55*  --  8.30* 8.52*  --   --   < > = values in this interval not displayed.   Assessment.Plan:  80yo female now therapeutic on heparin after rate decreases. Will continue gtt at current rate and confirm stable with additional level.   Vernard GamblesVeronda Sederick Jacobsen, PharmD, BCPS  01/17/2014,3:03 AM

## 2014-01-17 NOTE — Interval H&P Note (Signed)
History and Physical Interval Note:  01/17/2014 7:58 AM  Amanda [name]  has presented today for surgery, with the diagnosis of chest pain  The various methods of treatment have been discussed with the patient and family. After consideration of risks, benefits and other options for treatment, the patient has consented to  Procedure(s): LEFT HEART CATHETERIZATION WITH CORONARY ANGIOGRAM (N/A) with possible PCICath Lab Visit (complete for each Cath Lab visit)  Clinical Evaluation Leading to the Procedure:   ACS: yes  Non-ACS:    Anginal Classification: CCS IV  Anti-ischemic medical therapy: Maximal Therapy (2 or more classes of medications)  Non-Invasive Test Results: No non-invasive testing performed  Prior CABG: Previous CABG       as a surgical intervention .  The patient's history has been reviewed, patient examined, no change in status, stable for surgery.  I have reviewed the patient's chart and labs.  Questions were answered to the patient's satisfaction.     KELLY,THOMAS A

## 2014-01-17 NOTE — Progress Notes (Addendum)
Rec'd return call from Dr. Don BroachYousuf. Made aware of CBG result 326. No HS coverage scale ordered. New orders received. 10:40 CBG rechecked prior to administering coverage. CBG now 233. 3 units Novolog administered per SS.

## 2014-01-17 NOTE — Progress Notes (Signed)
Manual pressure held for 15 min at this time; no signs of bleeding; will cont. To monitor.

## 2014-01-17 NOTE — Progress Notes (Signed)
Nephew called out to RN; pt bleeding from R groin; dressing removed and manual pressure applied to site; cath lab staff called to room; will cont. To monitor.

## 2014-01-17 NOTE — Progress Notes (Signed)
SUBJECTIVE:  No chest pain.  No SOB.  Continue current therapy.    PHYSICAL EXAM Filed Vitals:   01/17/14 1106 01/17/14 1130 01/17/14 1210 01/17/14 1221  BP: 146/61 160/59 154/71 138/73  Pulse:      Temp:      TempSrc:      Resp:      Height:      Weight:      SpO2:       General:  No distress Lungs:  Clear Heart:  Mechanical S1 Abdomen:  Positive bowel sounds, no rebound no guarding Extremities:  Femoral access site with small hematoma and echymosis without bleeding, pulsatile mass.  LABS: Lab Results  Component Value Date   TROPONINI 8.52* 01/16/2014   Results for orders placed during the hospital encounter of 01/15/14 (from the past 24 hour(s))  HEPARIN LEVEL (UNFRACTIONATED)     Status: Abnormal   Collection Time    01/16/14  3:30 PM      Result Value Ref Range   Heparin Unfractionated 0.76 (*) 0.30 - 0.70 IU/mL  GLUCOSE, CAPILLARY     Status: Abnormal   Collection Time    01/16/14  4:45 PM      Result Value Ref Range   Glucose-Capillary 140 (*) 70 - 99 mg/dL  GLUCOSE, CAPILLARY     Status: Abnormal   Collection Time    01/16/14  9:29 PM      Result Value Ref Range   Glucose-Capillary 326 (*) 70 - 99 mg/dL  GLUCOSE, CAPILLARY     Status: Abnormal   Collection Time    01/16/14 11:00 PM      Result Value Ref Range   Glucose-Capillary 233 (*) 70 - 99 mg/dL  HEPARIN LEVEL (UNFRACTIONATED)     Status: None   Collection Time    01/17/14  1:30 AM      Result Value Ref Range   Heparin Unfractionated 0.35  0.30 - 0.70 IU/mL  BASIC METABOLIC PANEL     Status: Abnormal   Collection Time    01/17/14  1:30 AM      Result Value Ref Range   Sodium 144  137 - 147 mEq/L   Potassium 4.0  3.7 - 5.3 mEq/L   Chloride 104  96 - 112 mEq/L   CO2 29  19 - 32 mEq/L   Glucose, Bld 78  70 - 99 mg/dL   BUN 21  6 - 23 mg/dL   Creatinine, Ser 1.61  0.50 - 1.10 mg/dL   Calcium 9.1  8.4 - 09.6 mg/dL   GFR calc non Af Amer 77 (*) >90 mL/min   GFR calc Af Amer 89 (*) >90 mL/min    PROTIME-INR     Status: Abnormal   Collection Time    01/17/14  1:30 AM      Result Value Ref Range   Prothrombin Time 18.7 (*) 11.6 - 15.2 seconds   INR 1.61 (*) 0.00 - 1.49  CBC     Status: Abnormal   Collection Time    01/17/14  1:30 AM      Result Value Ref Range   WBC 6.5  4.0 - 10.5 K/uL   RBC 3.79 (*) 3.87 - 5.11 MIL/uL   Hemoglobin 12.4  12.0 - 15.0 g/dL   HCT 04.5  40.9 - 81.1 %   MCV 95.0  78.0 - 100.0 fL   MCH 32.7  26.0 - 34.0 pg   MCHC 34.4  30.0 - 36.0  g/dL   RDW 16.113.2  09.611.5 - 04.515.5 %   Platelets 261  150 - 400 K/uL  POCT ACTIVATED CLOTTING TIME     Status: None   Collection Time    01/17/14  9:13 AM      Result Value Ref Range   Activated Clotting Time 121    GLUCOSE, CAPILLARY     Status: Abnormal   Collection Time    01/17/14 11:19 AM      Result Value Ref Range   Glucose-Capillary 165 (*) 70 - 99 mg/dL  HEPARIN LEVEL (UNFRACTIONATED)     Status: Abnormal   Collection Time    01/17/14 11:31 AM      Result Value Ref Range   Heparin Unfractionated <0.10 (*) 0.30 - 0.70 IU/mL    Intake/Output Summary (Last 24 hours) at 01/17/14 1454 Last data filed at 01/17/14 1212  Gross per 24 hour  Intake 471.33 ml  Output    900 ml  Net -428.67 ml     ASSESSMENT AND PLAN:  NSTEMI (non-ST elevated myocardial infarction):  I reviewed the cath films.  Bypass grafts are patent.  Medical management.    MITRAL VALVE REPLACEMENT, HX OF, 2004, 27 mmmechanical valve:  We will restart warfarin today.  Given her risk of thromboembolism and valve thrombosis, despite the groin bleed today, we will need to restart heparin.  I asked pharmacy to start this in 8 hours post procedure without a bolus.  We will need to keep a close eye on her access site.     Diabetes mellitus:  Continue current meds.      Hypertension:  BP has come down.  Continue current meds.      Rollene RotundaJames Cruze Zingaro 01/17/2014 2:54 PM

## 2014-01-17 NOTE — Progress Notes (Signed)
Manual pressure released at this time; no bleeding noted; dressing applied; will cont. To monitor.

## 2014-01-17 NOTE — Progress Notes (Addendum)
Pt on call to cath lab. Consent signed and placed on front of chart. ASA 81mg  administered as ordered.Heparin gtt turned off . Pt voided.2 IV sites patent with good blood returns. !000ccs NSS infusing at 48.5 cc.. Pt has been NPO since MN. VS 97.9 77 22 96% 140/64

## 2014-01-17 NOTE — H&P (View-Only) (Signed)
DAILY PROGRESS NOTE  Subjective:  No events overnight. No further chest pain. Troponin I was elevated, but is trending down (9.55->8.3->8.52?).  INR is 2.05 today.  Continuing to hold warfarin for cath.  Objective:  Temp:  [97.8 F (36.6 C)-99 F (37.2 C)] 98.3 F (36.8 C) (03/29 1131) Pulse Rate:  [74-103] 80 (03/29 1131) Resp:  [18-26] 25 (03/29 1131) BP: (103-146)/(49-90) 108/49 mmHg (03/29 1131) SpO2:  [92 %-98 %] 95 % (03/29 1131) Weight:  [100 lb 14.4 oz (45.768 kg)] 100 lb 14.4 oz (45.768 kg) (03/29 0320) Weight change:   Intake/Output from previous day: 03/28 0701 - 03/29 0700 In: 169.8 [I.V.:169.8] Out: 300 [Urine:300]  Intake/Output from this shift: Total I/O In: 5 [I.V.:5] Out: -   Medications: Current Facility-Administered Medications  Medication Dose Route Frequency Provider Last Rate Last Dose  . 0.9 %  sodium chloride infusion   Intravenous Continuous Cecilie Kicks, NP 10 mL/hr at 01/16/14 0400    . amLODipine (NORVASC) tablet 5 mg  5 mg Oral Daily Dorothy Spark, MD   5 mg at 01/16/14 0957   And  . benazepril (LOTENSIN) tablet 20 mg  20 mg Oral Daily Dorothy Spark, MD   20 mg at 01/16/14 0956  . aspirin chewable tablet 324 mg  324 mg Oral NOW Cecilie Kicks, NP       Or  . aspirin suppository 300 mg  300 mg Rectal NOW Cecilie Kicks, NP      . aspirin EC tablet 81 mg  81 mg Oral Daily Cecilie Kicks, NP   81 mg at 01/16/14 0956  . atorvastatin (LIPITOR) tablet 40 mg  40 mg Oral q1800 Cecilie Kicks, NP   40 mg at 01/15/14 1725  . guaiFENesin (MUCINEX) 12 hr tablet 600 mg  600 mg Oral BID PRN Cecilie Kicks, NP      . heparin ADULT infusion 100 units/mL (25000 units/250 mL)  500 Units/hr Intravenous Continuous Dorothy Spark, MD 5 mL/hr at 01/16/14 0709 500 Units/hr at 01/16/14 0709  . insulin aspart (novoLOG) injection 0-9 Units  0-9 Units Subcutaneous TID WC Cecilie Kicks, NP   2 Units at 01/16/14 0749  . magnesium hydroxide (MILK OF MAGNESIA)  suspension 15 mL  15 mL Oral QHS Cecilie Kicks, NP   15 mL at 01/15/14 2222  . metoprolol succinate (TOPROL-XL) 24 hr tablet 75 mg  75 mg Oral QAC breakfast Cecilie Kicks, NP   75 mg at 01/16/14 0751  . nitroGLYCERIN (NITROGLYN) 2 % ointment 0.5 inch  0.5 inch Topical 4 times per day Cecilie Kicks, NP   0.5 inch at 01/16/14 0640  . nitroGLYCERIN (NITROSTAT) SL tablet 0.4 mg  0.4 mg Sublingual Q5 Min x 3 PRN Cecilie Kicks, NP        Physical Exam: General appearance: alert and no distress Neck: no carotid bruit and no JVD Lungs: clear to auscultation bilaterally Heart: regular rate and rhythm Abdomen: soft, non-tender; bowel sounds normal; no masses,  no organomegaly Extremities: extremities normal, atraumatic, no cyanosis or edema Pulses: 2+ and symmetric Skin: Skin color, texture, turgor normal. No rashes or lesions Neurologic: Grossly normal .  Lab Results: Results for orders placed during the hospital encounter of 01/15/14 (from the past 48 hour(s))  MRSA PCR SCREENING     Status: None   Collection Time    01/15/14  2:36 PM      Result Value Ref Range   MRSA by PCR NEGATIVE  NEGATIVE  Comment:            The GeneXpert MRSA Assay (FDA     approved for NASAL specimens     only), is one component of a     comprehensive MRSA colonization     surveillance program. It is not     intended to diagnose MRSA     infection nor to guide or     monitor treatment for     MRSA infections.  GLUCOSE, CAPILLARY     Status: Abnormal   Collection Time    01/15/14  5:09 PM      Result Value Ref Range   Glucose-Capillary 168 (*) 70 - 99 mg/dL  TROPONIN I     Status: Abnormal   Collection Time    01/15/14  6:40 PM      Result Value Ref Range   Troponin I 9.55 (*) <0.30 ng/mL   Comment:            Due to the release kinetics of cTnI,     a negative result within the first hours     of the onset of symptoms does not rule out     myocardial infarction with certainty.     If myocardial  infarction is still suspected,     repeat the test at appropriate intervals.     CRITICAL RESULT CALLED TO, READ BACK BY AND VERIFIED WITH:     OUSTERMAN,R RN 01/15/14 [address] [place]  PROTIME-INR     Status: Abnormal   Collection Time    01/15/14  6:40 PM      Result Value Ref Range   Prothrombin Time 23.1 (*) 11.6 - 15.2 seconds   INR 2.12 (*) 0.00 - 1.49  TSH     Status: None   Collection Time    01/15/14  6:40 PM      Result Value Ref Range   TSH 2.451  0.350 - 4.500 uIU/mL   Comment: Performed at [facility]  T4, FREE     Status: None   Collection Time    01/15/14  6:40 PM      Result Value Ref Range   Free T4 1.14  0.80 - 1.80 ng/dL   Comment: Performed at [facility]     Status: None   Collection Time    01/15/14  6:40 PM      Result Value Ref Range   Magnesium 2.0  1.5 - 2.5 mg/dL  HEMOGLOBIN A1C     Status: Abnormal   Collection Time    01/15/14  6:40 PM      Result Value Ref Range   Hemoglobin A1C 8.0 (*) <5.7 %   Comment: (NOTE)                                                                               According to the ADA Clinical Practice Recommendations for 2011, when     HbA1c is used as a screening test:      >=6.5%   Diagnostic of Diabetes Mellitus               (if abnormal result is confirmed)  5.7-6.4%   Increased risk of developing Diabetes Mellitus     References:Diagnosis and Classification of Diabetes Mellitus,Diabetes     CMKL,4917,91(TAVWP 1):S62-S69 and Standards of Medical Care in             Diabetes - 2011,Diabetes VXYI,0165,53 (Suppl 1):S11-S61.   Mean Plasma Glucose 183 (*) <117 mg/dL   Comment: Performed at [place], CAPILLARY     Status: Abnormal   Collection Time    01/15/14  9:52 PM      Result Value Ref Range   Glucose-Capillary 169 (*) 70 - 99 mg/dL  CBC     Status: None   Collection Time    01/16/14 12:15 AM      Result Value Ref Range   WBC 6.7  4.0 - 10.5 K/uL   RBC 3.93   3.87 - 5.11 MIL/uL   Hemoglobin 12.7  12.0 - 15.0 g/dL   HCT 37.2  36.0 - 46.0 %   MCV 94.7  78.0 - 100.0 fL   MCH 32.3  26.0 - 34.0 pg   MCHC 34.1  30.0 - 36.0 g/dL   RDW 12.9  11.5 - 15.5 %   Platelets 246  150 - 400 K/uL  TROPONIN I     Status: Abnormal   Collection Time    01/16/14 12:15 AM      Result Value Ref Range   Troponin I 8.30 (*) <0.30 ng/mL   Comment:            Due to the release kinetics of cTnI,     a negative result within the first hours     of the onset of symptoms does not rule out     myocardial infarction with certainty.     If myocardial infarction is still suspected,     repeat the test at appropriate intervals.     CRITICAL VALUE NOTED.  VALUE IS CONSISTENT WITH PREVIOUSLY REPORTED AND CALLED VALUE.  TROPONIN I     Status: Abnormal   Collection Time    01/16/14  6:23 AM      Result Value Ref Range   Troponin I 8.52 (*) <0.30 ng/mL   Comment:            Due to the release kinetics of cTnI,     a negative result within the first hours     of the onset of symptoms does not rule out     myocardial infarction with certainty.     If myocardial infarction is still suspected,     repeat the test at appropriate intervals.     CRITICAL VALUE NOTED.  VALUE IS CONSISTENT WITH PREVIOUSLY REPORTED AND CALLED VALUE.  CBC     Status: None   Collection Time    01/16/14  6:23 AM      Result Value Ref Range   WBC 7.2  4.0 - 10.5 K/uL   RBC 4.09  3.87 - 5.11 MIL/uL   Hemoglobin 13.5  12.0 - 15.0 g/dL   HCT 38.6  36.0 - 46.0 %   MCV 94.4  78.0 - 100.0 fL   MCH 33.0  26.0 - 34.0 pg   MCHC 35.0  30.0 - 36.0 g/dL   RDW 13.2  11.5 - 15.5 %   Platelets 258  150 - 400 K/uL  BASIC METABOLIC PANEL     Status: Abnormal   Collection Time    01/16/14  6:23 AM  Result Value Ref Range   Sodium 140  137 - 147 mEq/L   Potassium 4.8  3.7 - 5.3 mEq/L   Chloride 101  96 - 112 mEq/L   CO2 30  19 - 32 mEq/L   Glucose, Bld 162 (*) 70 - 99 mg/dL   BUN 24 (*) 6 - 23 mg/dL     Creatinine, Ser 0.90  0.50 - 1.10 mg/dL   Calcium 9.6  8.4 - 10.5 mg/dL   GFR calc non Af Amer 56 (*) >90 mL/min   GFR calc Af Amer 65 (*) >90 mL/min   Comment: (NOTE)     The eGFR has been calculated using the CKD EPI equation.     This calculation has not been validated in all clinical situations.     eGFR's persistently <90 mL/min signify possible Chronic Kidney     Disease.  PROTIME-INR     Status: Abnormal   Collection Time    01/16/14  6:23 AM      Result Value Ref Range   Prothrombin Time 22.5 (*) 11.6 - 15.2 seconds   INR 2.05 (*) 0.00 - 1.49  HEPARIN LEVEL (UNFRACTIONATED)     Status: None   Collection Time    01/16/14  6:23 AM      Result Value Ref Range   Heparin Unfractionated 0.67  0.30 - 0.70 IU/mL   Comment:            IF HEPARIN RESULTS ARE BELOW     EXPECTED VALUES, AND PATIENT     DOSAGE HAS BEEN CONFIRMED,     SUGGEST FOLLOW UP TESTING     OF ANTITHROMBIN III LEVELS.  URINALYSIS, ROUTINE W REFLEX MICROSCOPIC     Status: Abnormal   Collection Time    01/16/14  6:36 AM      Result Value Ref Range   Color, Urine YELLOW  YELLOW   APPearance CLEAR  CLEAR   Specific Gravity, Urine 1.013  1.005 - 1.030   pH 5.5  5.0 - 8.0   Glucose, UA NEGATIVE  NEGATIVE mg/dL   Hgb urine dipstick NEGATIVE  NEGATIVE   Bilirubin Urine NEGATIVE  NEGATIVE   Ketones, ur NEGATIVE  NEGATIVE mg/dL   Protein, ur NEGATIVE  NEGATIVE mg/dL   Urobilinogen, UA 0.2  0.0 - 1.0 mg/dL   Nitrite NEGATIVE  NEGATIVE   Leukocytes, UA TRACE (*) NEGATIVE  URINE MICROSCOPIC-ADD ON     Status: Abnormal   Collection Time    01/16/14  6:36 AM      Result Value Ref Range   Squamous Epithelial / LPF FEW (*) RARE   WBC, UA 0-2  <3 WBC/hpf   Bacteria, UA RARE  RARE  GLUCOSE, CAPILLARY     Status: Abnormal   Collection Time    01/16/14  7:42 AM      Result Value Ref Range   Glucose-Capillary 165 (*) 70 - 99 mg/dL    Imaging: No results found.  Assessment:  1. Principal Problem: 2.    NSTEMI (non-ST elevated myocardial infarction) 3. Active Problems: 4.   HYPERLIPIDEMIA-MIXED 5.   CAD, ARTERY BYPASS GRAFT, 2004, LIMA-LAD; VG-RCA; VG-LCX 6.   MITRAL VALVE REPLACEMENT, HX OF, 2004, 27 mmmechanical valve 7.   Diabetes mellitus 8.   Hypertension 9.   Plan:  1. No further chest pain - cardiac enzymes are trending down. Check 2D echo for EF, WMA's.  Plan for LHC, possibly tomorrow, however, INR may not be low enough.  May be Tuesday. Keep NPO p MN in case. Would not reverse INR due to her mechanical valve and theoretically increased risk of thrombosis.   Time Spent Directly with Patient:  15 minutes  Length of Stay:  LOS: 1 day   Pixie Casino, MD, [facility] Attending Cardiologist [facility]  HILTY,Kenneth C 01/16/2014, 11:57 AM

## 2014-01-17 NOTE — Consult Note (Signed)
Pharmacy Note-Anticoagulation  Pharmacy Consult :  80 y.o. female is currently on chronic Coumadin for history of Mechanical Valve.   - s/p L-Hrt Cath & angio.  Coumadin with Heparin bridging to resume today.   Latest Labs : Hematology :  Recent Labs  01/15/14 1840 01/16/14 0015 01/16/14 0623 01/16/14 1530 01/17/14 0130  HGB  --  12.7 13.5  --  12.4  HCT  --  37.2 38.6  --  36.0  PLT  --  246 258  --  261  LABPROT 23.1*  --  22.5*  --  18.7*  INR 2.12*  --  2.05*  --  1.61*  HEPARINUNFRC  --   --  0.67 0.76* 0.35  CREATININE  --   --  0.90  --  0.69    Current Medication[s] Include: Medication PTA: Prescriptions prior to admission  Medication Sig Dispense Refill  . amLODipine-benazepril (LOTREL) 5-20 MG per capsule Take 1 capsule by mouth daily.  30 capsule  11  . aspirin 81 MG EC tablet Take 81 mg by mouth daily.        Marland Kitchen. guaiFENesin (MUCINEX) 600 MG 12 hr tablet Take 600 mg by mouth 2 (two) times daily as needed for cough or to loosen phlegm.      . magnesium hydroxide (MILK OF MAGNESIA) 400 MG/5ML suspension Take 15 mLs by mouth at bedtime.      . metFORMIN (GLUMETZA) 500 MG (MOD) 24 hr tablet Take 500 mg by mouth daily with breakfast.        . metoprolol succinate (TOPROL-XL) 50 MG 24 hr tablet Take 75 mg by mouth daily. Take with or immediately following a meal.      . rosuvastatin (CRESTOR) 20 MG tablet Take 1 tablet (20 mg total) by mouth at bedtime.  30 tablet  11  . VITAMIN D, CHOLECALCIFEROL, PO Take 1 tablet by mouth daily.      Marland Kitchen. warfarin (COUMADIN) 4 MG tablet Take 2-4 mg by mouth daily. Takes 2mg  on Tuesday and 4mg  rest of week       Scheduled:  Scheduled:  . amLODipine  5 mg Oral Daily   And  . benazepril  20 mg Oral Daily  . [START ON 01/18/2014] aspirin EC  81 mg Oral Daily  . atorvastatin  40 mg Oral q1800  . insulin aspart  0-9 Units Subcutaneous TID WC & HS  . magnesium hydroxide  15 mL Oral QHS  . metoprolol succinate  75 mg Oral QAC breakfast  .  nitroGLYCERIN  0.5 inch Topical 4 times per day   Infusion[s]: Infusions:  . sodium chloride 10 mL/hr at 01/17/14 0000  . sodium chloride 700 mL (01/17/14 0948)  . heparin 450 Units/hr, Stopped for Cath/Angio   Heparin on hold Awaiting Heparin restart post-cath orders   Antibiotic[s]: Anti-infectives   None      Assessment :  Today's INR is 1.61.   INR is subtherapeutic.    Heparin level, prior to stopping for cath procedure,  0.35 units/ml - within therapeutic range.  Following cath procedure, patient had bleeding from Femoral site requiring pressure from Cath RN.  Currently no bleeding from site note [ 11:24 AM ].  Coumadin consult to restart Coumadin.    Per discussion with Dr. Antoine PocheHochrein, ok to restart Heparin bridging 8 hours after catheter site bleeding stopped [restart 1900 pm, no bolus]  Goal :  INR goal is to be clarified.  Initially will plan for INR > 2.  Heparin goal is Heparin level 0.3-0.7 units/ml.  Plan : 1. Heparin will be restarted, no bolus, 8 hours after femoral catheter site bleeding stopped at previous rate, 450 units/hr.   The next Heparin Level will be due 8 hours after restart.   2. Coumadin 5 mg po today x 1. 3. Heparin level due with AM labs, STAT results. 4. Daily Heparin level, INR, CBC, and Monitor for bleeding complications.  Aadith Raudenbush, Elisha Headland, Pharm.D.  4:34 PM  01/17/2014

## 2014-01-18 ENCOUNTER — Telehealth: Payer: Self-pay | Admitting: *Deleted

## 2014-01-18 LAB — GLUCOSE, CAPILLARY
Glucose-Capillary: 153 mg/dL — ABNORMAL HIGH (ref 70–99)
Glucose-Capillary: 235 mg/dL — ABNORMAL HIGH (ref 70–99)
Glucose-Capillary: 154 mg/dL — ABNORMAL HIGH (ref 70–99)
Glucose-Capillary: 308 mg/dL — ABNORMAL HIGH (ref 70–99)

## 2014-01-18 LAB — HEPARIN LEVEL (UNFRACTIONATED)
Heparin Unfractionated: 0.17 IU/mL — ABNORMAL LOW (ref 0.30–0.70)
Heparin Unfractionated: 0.15 IU/mL — ABNORMAL LOW (ref 0.30–0.70)

## 2014-01-18 LAB — PROTIME-INR
INR: 1.28 (ref 0.00–1.49)
Prothrombin Time: 15.7 seconds — ABNORMAL HIGH (ref 11.6–15.2)

## 2014-01-18 MED ORDER — WARFARIN SODIUM 7.5 MG PO TABS
7.5000 mg | ORAL_TABLET | Freq: Once | ORAL | Status: AC
  Administered 2014-01-18: 7.5 mg via ORAL
  Filled 2014-01-18: qty 1

## 2014-01-18 MED ORDER — ISOSORBIDE MONONITRATE 15 MG HALF TABLET
15.0000 mg | ORAL_TABLET | Freq: Every day | ORAL | Status: DC
  Administered 2014-01-18 – 2014-01-21 (×4): 15 mg via ORAL
  Filled 2014-01-18 (×4): qty 1

## 2014-01-18 MED ORDER — BOOST / RESOURCE BREEZE PO LIQD
1.0000 | Freq: Every day | ORAL | Status: DC
  Administered 2014-01-18 – 2014-01-20 (×3): 1 via ORAL

## 2014-01-18 NOTE — Telephone Encounter (Signed)
Patient's nephew (hcpoa) called to inform Dr. Mariah [facility]illingGollan [facility] [place] Patient had a heart attack. She had a heart cath 01/17/14

## 2014-01-18 NOTE — Progress Notes (Signed)
SUBJECTIVE:  No chest pain.  No SOB.     PHYSICAL EXAM Filed Vitals:   01/17/14 1500 01/17/14 2023 01/18/14 0552 01/18/14 0742  BP: 138/57 132/79 124/63 156/77  Pulse:  76 74 81  Temp:  97.8 F (36.6 C) 98 F (36.7 C)   TempSrc:  Oral Oral   Resp:  18 17   Height:      Weight:   104 lb 4.4 oz (47.3 kg)   SpO2:  95% 94%    General:  No distress Lungs:  Clear Heart:  Mechanical S1 Abdomen:  Positive bowel sounds, no rebound no guarding Extremities:  Femoral access site with small hematoma and echymosis without bleeding, pulsatile mass. No change from yesterday.   LABS: Lab Results  Component Value Date   TROPONINI 8.52* 01/16/2014   Results for orders placed during the hospital encounter of 01/15/14 (from the past 24 hour(s))  POCT ACTIVATED CLOTTING TIME     Status: None   Collection Time    01/17/14  9:13 AM      Result Value Ref Range   Activated Clotting Time 121    GLUCOSE, CAPILLARY     Status: Abnormal   Collection Time    01/17/14 11:19 AM      Result Value Ref Range   Glucose-Capillary 165 (*) 70 - 99 mg/dL  HEPARIN LEVEL (UNFRACTIONATED)     Status: Abnormal   Collection Time    01/17/14 11:31 AM      Result Value Ref Range   Heparin Unfractionated <0.10 (*) 0.30 - 0.70 IU/mL  GLUCOSE, CAPILLARY     Status: Abnormal   Collection Time    01/17/14  4:11 PM      Result Value Ref Range   Glucose-Capillary 167 (*) 70 - 99 mg/dL   Comment 1 Notify RN    GLUCOSE, CAPILLARY     Status: Abnormal   Collection Time    01/17/14  9:09 PM      Result Value Ref Range   Glucose-Capillary 186 (*) 70 - 99 mg/dL   Comment 1 Notify RN    HEPARIN LEVEL (UNFRACTIONATED)     Status: Abnormal   Collection Time    01/18/14  4:30 AM      Result Value Ref Range   Heparin Unfractionated 0.17 (*) 0.30 - 0.70 IU/mL  PROTIME-INR     Status: Abnormal   Collection Time    01/18/14  4:30 AM      Result Value Ref Range   Prothrombin Time 15.7 (*) 11.6 - 15.2 seconds   INR  1.28  0.00 - 1.49  GLUCOSE, CAPILLARY     Status: Abnormal   Collection Time    01/18/14  7:41 AM      Result Value Ref Range   Glucose-Capillary 153 (*) 70 - 99 mg/dL    Intake/Output Summary (Last 24 hours) at 01/18/14 0811 Last data filed at 01/18/14 0736  Gross per 24 hour  Intake 1285.86 ml  Output    500 ml  Net 785.86 ml     ASSESSMENT AND PLAN:  NSTEMI (non-ST elevated myocardial infarction):  I reviewed the cath films.  Bypass grafts are patent.  Medical management.   I stopped NTG paste and started Imdur  MITRAL VALVE REPLACEMENT, HX OF, 2004, 27 mmmechanical valve:  Warfarin restarted.  Continue until INR greater than 2.5.  Heparin restarted.      Diabetes mellitus:  Continue current meds.  Hypertension:  BP has come down.  Continue current meds.      Amanda [name] 01/18/2014 8:11 AM

## 2014-01-18 NOTE — Progress Notes (Signed)
INITIAL NUTRITION ASSESSMENT  DOCUMENTATION CODES Per approved criteria  -Severe malnutrition in the context of chronic illness -Underweight   INTERVENTION: Boost Breeze po daily, each supplement provides 250 kcal and 9 grams of protein RD to follow for nutrition care plan  NUTRITION DIAGNOSIS: Malnutrition related to chronic illness as evidenced by severe muscle & subcutaneous fat loss  Goal: Pt to meet >/= 90% of their estimated nutrition needs   Monitor:  PO & supplemental intake, weight, labs, I/O's  Reason for Assessment: Malnutrition Screening Tool Report  80 y.o. female  Admitting Dx: NSTEMI (non-ST elevated myocardial infarction)  ASSESSMENT: 39 -year-old with PMH of CAD, bypass surgery, HTN,  hyperlipidemia; has done well since that time until Friday.   She woke later than usual and once she was up she washed clothes and while sitting she developed a hard mid sternal chest pain that radiated across chest.   She was clammy but no nausea or SOB. She took 3 NTG of her own without relief though they may have been old. EMS was called and she was given another 3 sl NTG with some relief. Toradol in ER relieved the rest of the pain. She has not had any recurrent pain.   Patient reports her appetite is good; she states she's not "a big eater;" reports she sometimes drink Ensure supplements (once per week); + visible severe muscle and fat loss to upper body; weight has been stable; she was agreeable to trying Boost Breeze -- RD to order.  Vitamin K content 8 fl oz: Ensure Complete -- 20 mcg Boost Breeze -- 12 mcg  Nutrition Focused Physical Exam:  Subcutaneous Fat:  Orbital Region: N/A Upper Arm Region: severe depletion Thoracic and Lumbar Region: N/A  Muscle: Temple Region: moderate depletion Clavicle Bone Region: severe depletion Clavicle and Acromion Bone Region: severe depletion Scapular Bone Region: N/A Dorsal Hand: N/A Patellar Region: N/A Anterior Thigh  Region: N/A Posterior Calf Region: N/A  Edema: none  Patient meets criteria for severe malnutrition in the context of chronic illness as evidenced by severe muscle loss and severe subcutaneous fat loss.  Height: Ht 5\' 4"   (1.626 m)  Weight: Wt Readings from Last 1 Encounters:  01/18/14 104 lb 4.4 oz (47.3 kg)    Ideal Body Weight: 100 lb  % Ideal Body Weight: 104%  Wt Readings from Last 10 Encounters:  01/18/14 104 lb 4.4 oz (47.3 kg)  01/18/14 104 lb 4.4 oz (47.3 kg)  08/16/13 104 lb 12 oz (47.514 kg)  05/29/12 124 lb 8 oz (56.473 kg)  05/23/11 127 lb (57.607 kg)  11/22/10 122 lb 4 oz (55.452 kg)  04/05/10 123 lb (55.792 kg)    Usual Body Weight: 104 lb  % Usual Body Weight: 100%  BMI:  18.0 kg/m2  Estimated Nutritional Needs: Kcal: 1200-1400 Protein: 55-65 gm Fluid: >/= 1.5 L  Skin: Intact  Diet Order: Carb Control  EDUCATION NEEDS: -Educations needs addressed -- provided Vitamin K information from [employer]   Intake/Output Summary (Last 24 hours) at 01/18/14 1545 Last data filed at 01/18/14 1103  Gross per 24 hour  Intake 2312.53 ml  Output    200 ml  Net 2112.53 ml    Labs:   Recent Labs Lab 01/15/14 1840 01/16/14 0623 01/17/14 0130  NA  --  140 144  K  --  4.8 4.0  CL  --  101 104  CO2  --  30 29  BUN  --  24*  21  CREATININE  --  0.90 0.69  CALCIUM  --  9.6 9.1  MG 2.0  --   --   GLUCOSE  --  162* 78    CBG (last 3)   Recent Labs  01/17/14 2109 01/18/14 0741 01/18/14 1331  GLUCAP 186* 153* 235*    Scheduled Meds: . amLODipine  5 mg Oral Daily   And  . benazepril  20 mg Oral Daily  . aspirin EC  81 mg Oral Daily  . atorvastatin  40 mg Oral q1800  . insulin aspart  0-9 Units Subcutaneous TID WC & HS  . isosorbide mononitrate  15 mg Oral Daily  . magnesium hydroxide  15 mL Oral QHS  . metoprolol succinate  75 mg Oral QAC breakfast  . warfarin  7.5 mg Oral ONCE-1800  . Warfarin - Pharmacist Dosing  Inpatient   Does not apply q1800    Continuous Infusions: . sodium chloride 10 mL/hr at 01/17/14 0000  . sodium chloride 16 mL/hr at 01/18/14 0536  . heparin 500 Units/hr (01/18/14 1101)    Past Medical History  Diagnosis Date  . Coronary artery disease   . Mitral valve disease   . Hyperlipidemia   . Hypertension   . Diabetes mellitus without complication   . NSTEMI (non-ST elevated myocardial infarction) 01/15/2014    Past Surgical History  Procedure Laterality Date  . Coronary artery bypass graft    . Mitral valve replacement      Maureen ChattersKatie Harvie Morua, RD, LDN Pager #: [phone] After-Hours Pager #: [phone]

## 2014-01-18 NOTE — Progress Notes (Signed)
Pharmacy Note-Anticoagulation  Pharmacy Consult :  80 y.o. female is currently on Coumadin with Heparin bridging  for mechanical MVR.   Latest Labs : Hematology :  Recent Labs  01/16/14 0623 01/16/14 1530 01/17/14 0130 01/17/14 1131 01/18/14 0430 01/18/14 1301  HEPARINLVL 0.67 0.76* 0.35 <0.10* 0.17* 0.15*    Current Medication[s] Include:  Infusion[s]: Infusions:  . HEPARIN 500 Units/hr (01/18/14 1101)    Assessment :  Follow up Heparin level after rate increase reported down to 0.15 units/ml.  No bleeding complications observed.  Goal :  Heparin goal is Heparin level 0.3-0.7 units/ml.  Plan : 1. Heparin will be increased to 650 units/hr.   The next Heparin Level will be drawn in 8 hours 2. Continue Daily Heparin level, INR, CBC, and Monitor for bleeding complications.  Amanda [name], Amanda [name], Pharm.D. 01/18/2014  2:38 PM

## 2014-01-18 NOTE — Progress Notes (Signed)
Pharmacy Note-Anticoagulation  Pharmacy Consult :  80 y.o. female is currently on Coumadin with Heparin bridging for Mechanical MVR.   Latest Labs : Hematology :  Recent Labs  01/15/14 1840 01/16/14 0015 01/16/14 0623 01/16/14 1530 01/17/14 0130 01/17/14 1131 01/18/14 0430  HGB  --  12.7 13.5  --  12.4  --   --   HCT  --  37.2 38.6  --  36.0  --   --   PLT  --  246 258  --  261  --   --   LABPROT 23.1*  --  22.5*  --  18.7*  --  15.7*  INR 2.12*  --  2.05*  --  1.61*  --  1.28  HEPARINUNFRC  --   --  0.67 0.76* 0.35 <0.10* 0.17*  CREATININE  --   --  0.90  --  0.69  --   --     Current Medication[s] Include: Medication PTA: Prescriptions prior to admission  Medication Sig Dispense Refill  . amLODipine-benazepril (LOTREL) 5-20 MG per capsule Take 1 capsule by mouth daily.  30 capsule  11  . aspirin 81 MG EC tablet Take 81 mg by mouth daily.        Marland Kitchen guaiFENesin (MUCINEX) 600 MG 12 hr tablet Take 600 mg by mouth 2 (two) times daily as needed for cough or to loosen phlegm.      . magnesium hydroxide (MILK OF MAGNESIA) 400 MG/5ML suspension Take 15 mLs by mouth at bedtime.      . metFORMIN (GLUMETZA) 500 MG (MOD) 24 hr tablet Take 500 mg by mouth daily with breakfast.        . metoprolol succinate (TOPROL-XL) 50 MG 24 hr tablet Take 75 mg by mouth daily. Take with or immediately following a meal.      . rosuvastatin (CRESTOR) 20 MG tablet Take 1 tablet (20 mg total) by mouth at bedtime.  30 tablet  11  . VITAMIN D, CHOLECALCIFEROL, PO Take 1 tablet by mouth daily.      Marland Kitchen warfarin (COUMADIN) 4 MG tablet Take 2-4 mg by mouth daily. Takes 2mg  on Tuesday and 4mg  rest of week       Scheduled:  Scheduled:  . amLODipine  5 mg Oral Daily   And  . benazepril  20 mg Oral Daily  . aspirin EC  81 mg Oral Daily  . atorvastatin  40 mg Oral q1800  . insulin aspart  0-9 Units Subcutaneous TID WC & HS  . isosorbide mononitrate  15 mg Oral Daily  . magnesium hydroxide  15 mL Oral QHS  .  metoprolol succinate  75 mg Oral QAC breakfast  . warfarin  1 each Does not apply Once  . Warfarin - Pharmacist Dosing Inpatient   Does not apply q1800   Infusion[s]: Infusions:  . sodium chloride 10 mL/hr at 01/17/14 0000  . sodium chloride 16 mL/hr at 01/18/14 0536  . heparin 500 Units/hr (01/18/14 0536)    Assessment :  Today's INR is 1.28.   INR is Subtherapeutic.    Heparin level is pending later today following Heparin rate adjustment to 500 units/hr.  No new bleeding complications observed.  Goal :  INR goal is 2.5-3.5 for Mechanical MVR.   Heparin goal is Heparin level 0.3-0.7 units/ml.  Plan : 1. The next Heparin Level will be due at 1330 pm. 2. Increase Coumadin to 7.5 mg x 1 today. 3. Daily Heparin level, INR, CBC, and  Monitor for bleeding complications.  Mollie Rossano, Elisha HeadlandEarle J, Pharm.D. 01/18/2014  9:02 AM

## 2014-01-18 NOTE — Progress Notes (Signed)
ANTICOAGULATION CONSULT NOTE - Follow Up Consult  Pharmacy Consult for heparimn Indication: MVR  Labs:  Recent Labs  01/15/14 1840  01/16/14 0015 01/16/14 0623  01/17/14 0130 01/17/14 1131 01/18/14 0430  HGB  --   < > 12.7 13.5  --  12.4  --   --   HCT  --   --  37.2 38.6  --  36.0  --   --   PLT  --   --  246 258  --  261  --   --   LABPROT 23.1*  --   --  22.5*  --  18.7*  --  15.7*  INR 2.12*  --   --  2.05*  --  1.61*  --  1.28  HEPARINUNFRC  --   --   --  0.67  < > 0.35 <0.10* 0.17*  CREATININE  --   --   --  0.90  --  0.69  --   --   TROPONINI 9.55*  --  8.30* 8.52*  --   --   --   --   < > = values in this interval not displayed.   Assessment: 80yo female subtherapeutic on heparin after resumed post-cath; last pm pt had significant groin site bleed that required a long period of holding pressure, no bleeding issues overnight.  Goal of Therapy:  Heparin level 0.3-0.7 units/ml   Plan:  Will increase heparin gtt conservatively to 500 units/hr and check level in 8hr.  Vernard GamblesVeronda Soma Bachand, PharmD, BCPS  01/18/2014,5:22 AM

## 2014-01-19 DIAGNOSIS — E43 Unspecified severe protein-calorie malnutrition: Secondary | ICD-10-CM | POA: Diagnosis present

## 2014-01-19 DIAGNOSIS — Z954 Presence of other heart-valve replacement: Secondary | ICD-10-CM

## 2014-01-19 LAB — CBC
HCT: 32.8 % — ABNORMAL LOW (ref 36.0–46.0)
Hemoglobin: 11.1 g/dL — ABNORMAL LOW (ref 12.0–15.0)
MCH: 32.7 pg (ref 26.0–34.0)
MCHC: 33.8 g/dL (ref 30.0–36.0)
MCV: 96.8 fL (ref 78.0–100.0)
Platelets: 209 10*3/uL (ref 150–400)
RBC: 3.39 MIL/uL — ABNORMAL LOW (ref 3.87–5.11)
RDW: 13.3 % (ref 11.5–15.5)
WBC: 5.5 10*3/uL (ref 4.0–10.5)

## 2014-01-19 LAB — HEPARIN LEVEL (UNFRACTIONATED)
Heparin Unfractionated: <0.1 IU/mL — ABNORMAL LOW (ref 0.30–0.70)
Heparin Unfractionated: 0.25 IU/mL — ABNORMAL LOW (ref 0.30–0.70)
Heparin Unfractionated: 0.74 IU/mL — ABNORMAL HIGH (ref 0.30–0.70)

## 2014-01-19 LAB — GLUCOSE, CAPILLARY
Glucose-Capillary: 147 mg/dL — ABNORMAL HIGH (ref 70–99)
Glucose-Capillary: 230 mg/dL — ABNORMAL HIGH (ref 70–99)
Glucose-Capillary: 209 mg/dL — ABNORMAL HIGH (ref 70–99)
Glucose-Capillary: 298 mg/dL — ABNORMAL HIGH (ref 70–99)

## 2014-01-19 LAB — PROTIME-INR
INR: 1.59 — ABNORMAL HIGH (ref 0.00–1.49)
Prothrombin Time: 18.5 seconds — ABNORMAL HIGH (ref 11.6–15.2)

## 2014-01-19 MED ORDER — WARFARIN SODIUM 7.5 MG PO TABS
7.5000 mg | ORAL_TABLET | Freq: Once | ORAL | Status: AC
  Administered 2014-01-19: 7.5 mg via ORAL
  Filled 2014-01-19: qty 1

## 2014-01-19 NOTE — Progress Notes (Signed)
ANTICOAGULATION CONSULT NOTE - Follow Up Consult  Pharmacy Consult for Heparin bridge (with warfarin) Indication: Mechanical MVR  Allergies  Allergen Reactions  . Codeine     REACTION: makes her "crazy"  . Penicillins Hives  . Tramadol     Hallucinations    Patient Measurements: Height: 5' (152.4 cm) Weight: 104 lb 4.4 oz (47.3 kg) IBW/kg (Calculated) : 45.5  Vital Signs: Temp: 98.3 F (36.8 C) (03/31 2049) Temp src: Oral (03/31 2049) BP: 134/71 mmHg (03/31 2049) Pulse Rate: 82 (03/31 2049)  Labs:  Recent Labs  01/16/14 0623  01/17/14 0130  01/18/14 0430 01/18/14 1301 01/18/14 2252  HGB 13.5  --  12.4  --   --   --   --   HCT 38.6  --  36.0  --   --   --   --   PLT 258  --  261  --   --   --   --   LABPROT 22.5*  --  18.7*  --  15.7*  --   --   INR 2.05*  --  1.61*  --  1.28  --   --   HEPARINUNFRC 0.67  < > 0.35  < > 0.17* 0.15* <0.10*  CREATININE 0.90  --  0.69  --   --   --   --   TROPONINI 8.52*  --   --   --   --   --   --   < > = values in this interval not displayed.  Estimated Creatinine Clearance: 36.3 ml/min (by C-G formula based on Cr of 0.69).   Medications:  Heparin 650 units/hr  Assessment: 80 y/o F with mechanical MVR on heparin/warfarin bridge. HL is undetectable despite rate increase. No line issues per RN. Other labs as above.   Goal of Therapy:  Heparin level 0.3-0.7 units/ml Monitor platelets by anticoagulation protocol: Yes   Plan:  -Increase heparin to 800 units/hr -1000 HL -Daily CBC/HL -Monitor for bleeding -Warfarin per previous note  Abran DukeLedford, Lasaro Primm 01/19/2014,1:31 AM

## 2014-01-19 NOTE — Progress Notes (Signed)
Pharmacy Note-Anticoagulation  Pharmacy Consult :  80 y.o. female is currently on Coumadin with Heparin bridging for Mechanical MVR.   Latest Labs : Hematology :  Recent Labs  01/17/14 0130 01/17/14 1131 01/18/14 0430 01/18/14 1301 01/18/14 2252 01/19/14 0432  HGB 12.4  --   --   --   --   --   HCT 36.0  --   --   --   --   --   PLT 261  --   --   --   --   --   LABPROT 18.7*  --  15.7*  --   --  18.5*  INR 1.61*  --  1.28  --   --  1.59*  HEPARINUNFRC 0.35 <0.10* 0.17* 0.15* <0.10*  --   CREATININE 0.69  --   --   --   --   --     Current Medication[s] Include: Medication PTA: Prescriptions prior to admission  Medication Sig Dispense Refill  . amLODipine-benazepril (LOTREL) 5-20 MG per capsule Take 1 capsule by mouth daily.  30 capsule  11  . aspirin 81 MG EC tablet Take 81 mg by mouth daily.        Marland Kitchen. guaiFENesin (MUCINEX) 600 MG 12 hr tablet Take 600 mg by mouth 2 (two) times daily as needed for cough or to loosen phlegm.      . magnesium hydroxide (MILK OF MAGNESIA) 400 MG/5ML suspension Take 15 mLs by mouth at bedtime.      . metFORMIN (GLUMETZA) 500 MG (MOD) 24 hr tablet Take 500 mg by mouth daily with breakfast.        . metoprolol succinate (TOPROL-XL) 50 MG 24 hr tablet Take 75 mg by mouth daily. Take with or immediately following a meal.      . rosuvastatin (CRESTOR) 20 MG tablet Take 1 tablet (20 mg total) by mouth at bedtime.  30 tablet  11  . VITAMIN D, CHOLECALCIFEROL, PO Take 1 tablet by mouth daily.      Marland Kitchen. warfarin (COUMADIN) 4 MG tablet Take 2-4 mg by mouth daily. Takes 2mg  on Tuesday and 4mg  rest of week       Scheduled:  Scheduled:  . amLODipine  5 mg Oral Daily   And  . benazepril  20 mg Oral Daily  . aspirin EC  81 mg Oral Daily  . atorvastatin  40 mg Oral q1800  . feeding supplement (RESOURCE BREEZE)  1 Container Oral QPC supper  . insulin aspart  0-9 Units Subcutaneous TID WC & HS  . isosorbide mononitrate  15 mg Oral Daily  . magnesium hydroxide   15 mL Oral QHS  . metoprolol succinate  75 mg Oral QAC breakfast  . Warfarin - Pharmacist Dosing Inpatient   Does not apply q1800    Infusion[s]: Infusions:  . sodium chloride 10 mL/hr at 01/17/14 0000  . sodium chloride 16 mL/hr at 01/18/14 0536  . heparin 800 Units/hr (01/19/14 0135)   Assessment :  Today's INR trending up to 1.59.   INR remains below INR goal.    Heparin level  0.25 units/ml, remaining below therapeutic range.  No bleeding complications observed.  Goal :  INR goal is 2.5-3.5  Heparin goal is Heparin level 0.3-0.7 units/ml.  Plan : 1. Heparin will be increased to 900 units/hr.   The next Heparin Level will be reviewed in 8  hours 2. Repeat Coumadin 7.5 mg today. 3. Continue Daily Heparin level, INR, CBC, and  Monitor for bleeding complications.  Emireth Cockerham, Elisha Headland, Pharm.D. 01/19/2014  9:22 AM

## 2014-01-19 NOTE — Progress Notes (Signed)
SUBJECTIVE:  Denies chest pain and SOB.      PHYSICAL EXAM Filed Vitals:   01/18/14 1434 01/18/14 2049 01/19/14 0332 01/19/14 0453  BP: 142/66 134/71  130/54  Pulse: 83 82  77  Temp: 98.1 F (36.7 C) 98.3 F (36.8 C)  97.7 F (36.5 C)  TempSrc: Oral Oral  Oral  Resp: 18 17  16   Height:      Weight:   107 lb (48.535 kg)   SpO2: 98% 95%  95%   General:  No distress Lungs:  +Kyphosis. Clear w/o wheezes, rhonchi and rales Heart:  RRR. Mechanical S1 Abdomen:  Positive bowel sounds, no rebound no guarding Extremities:  No LEE  LABS:  Results for orders placed during the hospital encounter of 01/15/14 (from the past 24 hour(s))  HEPARIN LEVEL (UNFRACTIONATED)     Status: Abnormal   Collection Time    01/18/14  1:01 PM      Result Value Ref Range   Heparin Unfractionated 0.15 (*) 0.30 - 0.70 IU/mL  GLUCOSE, CAPILLARY     Status: Abnormal   Collection Time    01/18/14  1:31 PM      Result Value Ref Range   Glucose-Capillary 235 (*) 70 - 99 mg/dL  GLUCOSE, CAPILLARY     Status: Abnormal   Collection Time    01/18/14  4:22 PM      Result Value Ref Range   Glucose-Capillary 154 (*) 70 - 99 mg/dL  GLUCOSE, CAPILLARY     Status: Abnormal   Collection Time    01/18/14  9:01 PM      Result Value Ref Range   Glucose-Capillary 308 (*) 70 - 99 mg/dL  HEPARIN LEVEL (UNFRACTIONATED)     Status: Abnormal   Collection Time    01/18/14 10:52 PM      Result Value Ref Range   Heparin Unfractionated <0.10 (*) 0.30 - 0.70 IU/mL  PROTIME-INR     Status: Abnormal   Collection Time    01/19/14  4:32 AM      Result Value Ref Range   Prothrombin Time 18.5 (*) 11.6 - 15.2 seconds   INR 1.59 (*) 0.00 - 1.49  GLUCOSE, CAPILLARY     Status: Abnormal   Collection Time    01/19/14  6:27 AM      Result Value Ref Range   Glucose-Capillary 147 (*) 70 - 99 mg/dL    Intake/Output Summary (Last 24 hours) at 01/19/14 0841 Last data filed at 01/19/14 [id number]  Gross per 24 hour  Intake 978.05  ml  Output      0 ml  Net 978.05 ml     ASSESSMENT AND PLAN:  NSTEMI (non-ST elevated myocardial infarction):  No further CP. Continue medical management: ASA, BB, CCB, nitrate, ACE and statin.   MITRAL VALVE REPLACEMENT, HX OF, 2004, 27 mmmechanical valve:  Warfarin restarted with heparin bridge. INR remains subtherapeutic today at 1.59. Continue until INR greater than 2.5.       Diabetes mellitus:  Continue current meds.      Hypertension:  Stable w/ most recent BP of 130/54.  Continue current meds.    Protein calorie malnutrition severe:  We appreciate the help of nutrition.    Dispo: Home once INR reaches therapeutic range (2.5-3.5)  History and all data above reviewed.  Patient examined.  I agree with the findings as above.  No complaints The patient exam reveals COR:RRR  ,  Lungs: Clear  ,  Abd: Positive bowel sounds, no rebound no guarding, Ext Right groin OK  .  All available labs, radiology testing, previous records reviewed. Agree with documented assessment and plan. Plan continue heparin until INR therapeutic.   Amanda [name] Amanda [name]  9:24 AM  01/19/2014     SIMMONS, Avon Gully 01/19/2014 8:41 AM

## 2014-01-19 NOTE — Progress Notes (Signed)
ANTICOAGULATION CONSULT NOTE - Follow Up Consult  Pharmacy Consult for heparin Indication: MVR  Labs:  Recent Labs  01/17/14 0130  01/18/14 0430  01/18/14 2252 01/19/14 0432 01/19/14 1055 01/19/14 2235  HGB 12.4  --   --   --   --   --  11.1*  --   HCT 36.0  --   --   --   --   --  32.8*  --   PLT 261  --   --   --   --   --  209  --   LABPROT 18.7*  --  15.7*  --   --  18.5*  --   --   INR 1.61*  --  1.28  --   --  1.59*  --   --   HEPARINUNFRC 0.35  < > 0.17*  < > <0.10*  --  0.25* 0.74*  CREATININE 0.69  --   --   --   --   --   --   --   < > = values in this interval not displayed.   Assessment: 80yo female now slightly supratherapeutic on heparin after rate increases.  Goal of Therapy:  Heparin level 0.3-0.7 units/ml   Plan:  Will decrease heparin gtt slightly to 850 units/hr (had low level at 800 units/hr) and check level with am labs.  Vernard GamblesVeronda Burlon Centrella, PharmD, BCPS  01/19/2014,11:09 PM

## 2014-01-20 ENCOUNTER — Encounter: Payer: Self-pay | Admitting: Cardiovascular Disease

## 2014-01-20 LAB — CBC
HCT: 30.2 % — ABNORMAL LOW (ref 36.0–46.0)
Hemoglobin: 10.2 g/dL — ABNORMAL LOW (ref 12.0–15.0)
MCH: 32.6 pg (ref 26.0–34.0)
MCHC: 33.8 g/dL (ref 30.0–36.0)
MCV: 96.5 fL (ref 78.0–100.0)
Platelets: 221 10*3/uL (ref 150–400)
RBC: 3.13 MIL/uL — ABNORMAL LOW (ref 3.87–5.11)
RDW: 13.6 % (ref 11.5–15.5)
WBC: 6.3 10*3/uL (ref 4.0–10.5)

## 2014-01-20 LAB — GLUCOSE, CAPILLARY
Glucose-Capillary: 130 mg/dL — ABNORMAL HIGH (ref 70–99)
Glucose-Capillary: 229 mg/dL — ABNORMAL HIGH (ref 70–99)
Glucose-Capillary: 182 mg/dL — ABNORMAL HIGH (ref 70–99)
Glucose-Capillary: 237 mg/dL — ABNORMAL HIGH (ref 70–99)

## 2014-01-20 LAB — HEPARIN LEVEL (UNFRACTIONATED)
Heparin Unfractionated: 0.82 IU/mL — ABNORMAL HIGH (ref 0.30–0.70)
Heparin Unfractionated: 0.68 IU/mL (ref 0.30–0.70)

## 2014-01-20 LAB — PROTIME-INR
INR: 1.89 — ABNORMAL HIGH (ref 0.00–1.49)
Prothrombin Time: 21.1 seconds — ABNORMAL HIGH (ref 11.6–15.2)

## 2014-01-20 MED ORDER — WARFARIN SODIUM 7.5 MG PO TABS
7.5000 mg | ORAL_TABLET | Freq: Once | ORAL | Status: AC
  Administered 2014-01-20: 7.5 mg via ORAL
  Filled 2014-01-20: qty 1

## 2014-01-20 NOTE — Progress Notes (Signed)
ANTICOAGULATION CONSULT NOTE - Follow Up Consult  Pharmacy Consult for Heparin bridge (with warfarin) Indication: Mechanical MVR  Allergies  Allergen Reactions  . Codeine     REACTION: makes her "crazy"  . Penicillins Hives  . Tramadol     Hallucinations    Patient Measurements: Height: 5' (152.4 cm) Weight: 106 lb 11.2 oz (48.4 kg) IBW/kg (Calculated) : 45.5  Vital Signs: Temp: 97.8 F (36.6 C) (04/02 1335) Temp src: Oral (04/02 1335) BP: 113/57 mmHg (04/02 1335) Pulse Rate: 83 (04/02 1335)  Labs:  Recent Labs  01/18/14 0430  01/19/14 0432 01/19/14 1055 01/19/14 2235 01/20/14 0500 01/20/14 1450  HGB  --   --   --  11.1*  --  10.2*  --   HCT  --   --   --  32.8*  --  30.2*  --   PLT  --   --   --  209  --  221  --   LABPROT 15.7*  --  18.5*  --   --  21.1*  --   INR 1.28  --  1.59*  --   --  1.89*  --   HEPARINUNFRC 0.17*  < >  --  0.25* 0.74* 0.82* 0.68  < > = values in this interval not displayed.  Estimated Creatinine Clearance: 36.3 ml/min (by C-G formula based on Cr of 0.69).  Assessment: 80 y/o F with mechanical MVR on heparin/warfarin bridge. HL is therapeutic at 0.68 after rate decreased to 700 units/hr this morning.  INR 1.89 after 5 - 7.5 - 7.5 mg doses. Her home coumadin dose is 4 mg daily except 2 mg  on Tuesdays only.  No bleeding reported.   To be discharged home once INR > or = 2.5.   Goal of Therapy:  Heparin level 0.3-0.7 units/ml Monitor platelets by anticoagulation protocol: Yes INR 2.5 - 3.5   Plan:  1. Continue heparin drip at 700 units/hr 2. Repeat 7.5 mg coumadin x 1 dose today 3. Daily HL, CBC, INR 4. Home once INR > = 2.5 for MVR.  Herby AbrahamMichelle T. Seven [place], Pharm.D. [phone] 01/20/2014 3:59 PM

## 2014-01-20 NOTE — Progress Notes (Signed)
ANTICOAGULATION CONSULT NOTE - Follow Up Consult  Pharmacy Consult for heparin Indication: MVR  Labs:  Recent Labs  01/18/14 0430  01/19/14 0432 01/19/14 1055 01/19/14 2235 01/20/14 0500  HGB  --   --   --  11.1*  --  10.2*  HCT  --   --   --  32.8*  --  30.2*  PLT  --   --   --  209  --  221  LABPROT 15.7*  --  18.5*  --   --  21.1*  INR 1.28  --  1.59*  --   --  1.89*  HEPARINUNFRC 0.17*  < >  --  0.25* 0.74* 0.82*  < > = values in this interval not displayed.   Assessment: 80yo female now supratherapeutic with higher level despite rate decrease, now apparently accumulating; earlier in admission was therapeutic as low as 450 units/hr.  Goal of Therapy:  Heparin level 0.3-0.7 units/ml   Plan:  Will decrease heparin gtt by 3 units/kg/hr to 700 units/hr and check level with am labs.  Vernard GamblesVeronda Bernadetta Roell, PharmD, BCPS  01/20/2014,6:46 AM

## 2014-01-20 NOTE — Progress Notes (Signed)
Inpatient Diabetes Program Recommendations  AACE/ADA: New Consensus Statement on Inpatient Glycemic Control (2013)  Target Ranges:  Prepandial:   less than 140 mg/dL      Peak postprandial:   less than 180 mg/dL (1-2 hours)      Critically ill patients:  140 - 180 mg/dL    Inpatient Diabetes Program Recommendations Correction (SSI): CBG;s before lunch and dinner and after dinner at HS are elevated. Please increase correction to moderate tidwc.  Thank you, Amanda CoffinAnn Lamichael Youkhana, RN, CNS, Diabetes Coordinator [phone])

## 2014-01-20 NOTE — Progress Notes (Signed)
SUBJECTIVE:  Denies chest pain and SOB.      PHYSICAL EXAM Filed Vitals:   01/19/14 0453 01/19/14 1500 01/19/14 2115 01/20/14 0551  BP: 130/54 119/59 122/82 113/45  Pulse: 77 86 85 76  Temp: 97.7 F (36.5 C) 97.7 F (36.5 C) 98.5 F (36.9 C) 98.1 F (36.7 C)  TempSrc: Oral Oral Oral Oral  Resp: 16 20 18 18   Height:      Weight:    106 lb 11.2 oz (48.4 kg)  SpO2: 95% 96% 98% 96%   General:  No distress Lungs: Clear Heart:  RRR. Mechanical S1 Abdomen:  Positive bowel sounds, no rebound no guarding Extremities:  No LEE  LABS:  Results for orders placed during the hospital encounter of 01/15/14 (from the past 24 hour(s))  CBC     Status: Abnormal   Collection Time    01/19/14 10:55 AM      Result Value Ref Range   WBC 5.5  4.0 - 10.5 K/uL   RBC 3.39 (*) 3.87 - 5.11 MIL/uL   Hemoglobin 11.1 (*) 12.0 - 15.0 g/dL   HCT 16.132.8 (*) 09.636.0 - 04.546.0 %   MCV 96.8  78.0 - 100.0 fL   MCH 32.7  26.0 - 34.0 pg   MCHC 33.8  30.0 - 36.0 g/dL   RDW 40.913.3  81.111.5 - 91.415.5 %   Platelets 209  150 - 400 K/uL  HEPARIN LEVEL (UNFRACTIONATED)     Status: Abnormal   Collection Time    01/19/14 10:55 AM      Result Value Ref Range   Heparin Unfractionated 0.25 (*) 0.30 - 0.70 IU/mL  GLUCOSE, CAPILLARY     Status: Abnormal   Collection Time    01/19/14 11:16 AM      Result Value Ref Range   Glucose-Capillary 230 (*) 70 - 99 mg/dL   Comment 1 Notify RN     Comment 2 Documented in Chart    GLUCOSE, CAPILLARY     Status: Abnormal   Collection Time    01/19/14  4:32 PM      Result Value Ref Range   Glucose-Capillary 209 (*) 70 - 99 mg/dL   Comment 1 Documented in Chart     Comment 2 Notify RN    GLUCOSE, CAPILLARY     Status: Abnormal   Collection Time    01/19/14  9:13 PM      Result Value Ref Range   Glucose-Capillary 298 (*) 70 - 99 mg/dL   Comment 1 Documented in Chart     Comment 2 Notify RN    HEPARIN LEVEL (UNFRACTIONATED)     Status: Abnormal   Collection Time    01/19/14 10:35  PM      Result Value Ref Range   Heparin Unfractionated 0.74 (*) 0.30 - 0.70 IU/mL  PROTIME-INR     Status: Abnormal   Collection Time    01/20/14  5:00 AM      Result Value Ref Range   Prothrombin Time 21.1 (*) 11.6 - 15.2 seconds   INR 1.89 (*) 0.00 - 1.49  CBC     Status: Abnormal   Collection Time    01/20/14  5:00 AM      Result Value Ref Range   WBC 6.3  4.0 - 10.5 K/uL   RBC 3.13 (*) 3.87 - 5.11 MIL/uL   Hemoglobin 10.2 (*) 12.0 - 15.0 g/dL   HCT 78.230.2 (*) 95.636.0 - 21.346.0 %  MCV 96.5  78.0 - 100.0 fL   MCH 32.6  26.0 - 34.0 pg   MCHC 33.8  30.0 - 36.0 g/dL   RDW 16.1  09.6 - 04.5 %   Platelets 221  150 - 400 K/uL  HEPARIN LEVEL (UNFRACTIONATED)     Status: Abnormal   Collection Time    01/20/14  5:00 AM      Result Value Ref Range   Heparin Unfractionated 0.82 (*) 0.30 - 0.70 IU/mL  GLUCOSE, CAPILLARY     Status: Abnormal   Collection Time    01/20/14  6:21 AM      Result Value Ref Range   Glucose-Capillary 130 (*) 70 - 99 mg/dL   Comment 1 Documented in Chart     Comment 2 Notify RN      Intake/Output Summary (Last 24 hours) at 01/20/14 0834 Last data filed at 01/19/14 1700  Gross per 24 hour  Intake    480 ml  Output      0 ml  Net    480 ml     ASSESSMENT AND PLAN:  NSTEMI (non-ST elevated myocardial infarction):  No further CP. Continue medical management: ASA, BB, CCB, nitrate, ACE and statin.   MITRAL VALVE REPLACEMENT, HX OF, 2004, 27 mmmechanical valve:  Warfarin restarted with heparin bridge. INR remains subtherapeutic today at 1.89. Continue until INR greater than 2.5.       Diabetes mellitus:  Continue current meds.      Hypertension:  Continue current meds.    Protein calorie malnutrition severe:  We appreciate the help of nutrition.    Dispo: Home once INR reaches therapeutic range (2.5-3.5)     Rollene Rotunda 01/20/2014 8:34 AM

## 2014-01-21 ENCOUNTER — Other Ambulatory Visit: Payer: Self-pay | Admitting: Physician Assistant

## 2014-01-21 DIAGNOSIS — D649 Anemia, unspecified: Secondary | ICD-10-CM

## 2014-01-21 LAB — CBC
HCT: 29 % — ABNORMAL LOW (ref 36.0–46.0)
Hemoglobin: 9.6 g/dL — ABNORMAL LOW (ref 12.0–15.0)
MCH: 32.1 pg (ref 26.0–34.0)
MCHC: 33.1 g/dL (ref 30.0–36.0)
MCV: 97 fL (ref 78.0–100.0)
Platelets: 223 10*3/uL (ref 150–400)
RBC: 2.99 MIL/uL — ABNORMAL LOW (ref 3.87–5.11)
RDW: 13.8 % (ref 11.5–15.5)
WBC: 5.6 10*3/uL (ref 4.0–10.5)

## 2014-01-21 LAB — PROTIME-INR
INR: 2.87 — ABNORMAL HIGH (ref 0.00–1.49)
Prothrombin Time: 29.1 seconds — ABNORMAL HIGH (ref 11.6–15.2)

## 2014-01-21 LAB — HEPARIN LEVEL (UNFRACTIONATED): Heparin Unfractionated: 0.5 IU/mL (ref 0.30–0.70)

## 2014-01-21 LAB — GLUCOSE, CAPILLARY
Glucose-Capillary: 165 mg/dL — ABNORMAL HIGH (ref 70–99)
Glucose-Capillary: 219 mg/dL — ABNORMAL HIGH (ref 70–99)

## 2014-01-21 MED ORDER — BOOST / RESOURCE BREEZE PO LIQD: 1.0000 | Freq: Every day | ORAL | Status: DC

## 2014-01-21 MED ORDER — NITROGLYCERIN 0.4 MG SL SUBL: 0.4000 mg | SUBLINGUAL_TABLET | SUBLINGUAL | Status: DC | PRN

## 2014-01-21 MED ORDER — ISOSORBIDE MONONITRATE ER 30 MG PO TB24: 15.0000 mg | ORAL_TABLET | Freq: Every day | ORAL | Status: DC

## 2014-01-21 NOTE — Discharge Summary (Signed)
CARDIOLOGY DISCHARGE SUMMARY   Patient ID: Amanda [name] MRN: [id number] DOB/AGE: August 26, 1925 80 y.o.  Admit date: 01/15/2014 Discharge date: 01/21/2014  PCP: Amanda RoadsHOLT,Arabel Barcenas B, MD Primary Cardiologist: Amanda [name]  Primary Discharge Diagnosis:   NSTEMI (non-ST elevated myocardial infarction) Secondary Discharge Diagnosis:    HYPERLIPIDEMIA-MIXED   CAD, ARTERY BYPASS GRAFT, 2004, LIMA-LAD; VG-RCA; VG-LCX   MITRAL VALVE REPLACEMENT, HX OF, 2004, 27 mmmechanical valve   Diabetes mellitus   Hypertension   Protein-calorie malnutrition, severe   Anemia  Consults:  Dietitian  Procedures: cardiac catheterization, coronary arteriogram, SVG angiogram, LIMA arteriogram, left ventriculogram   Hospital Course: Amanda [name] is a 80 y.o. female with a history of CAD and other medical problems including the ones listed above. [place] because of chest pain. Her CK-MB and troponin were both elevated. She was transferred to [facility] for further evaluation and treatment.   Her cardiac enzymes remained elevated, indicating a non-STEMI. She was medically managed and her INR was allowed to drift down. She was on aspirin, a statin, and a beta blocker. She was also on nitrates. When her INR became sub-therapeutic, she was started on heparin. On 3/30 her INR was low enough for her to go to the Cath Lab.  Cardiac catheterization results are below. Her grafts were patent but she had severe native three-vessel disease in her EF was 45%. Dr. Nicholaus [name] felt the MI was from proximal disease, not supplied by grafts and medical therapy was recommended.  She was continued on the heparin, and her Coumadin was restarted. Her blood sugars were managed with a combination of her home medications and sliding scale insulin. A hemoglobin A1c was performed and was elevated, results below she is currently to continue on her home diabetes medications and follow up with her primary care  physician. Her vital signs were followed carefully and her blood pressure medications were adjusted as needed.  She was seen by a dietitian because she was felt to be underweight and had severe malnutrition in the context of an acute illness. Boost was recommended daily for nutritional and protein supplement. This was recommended over Ensure because it has less vitamin K.   She was noted to have some anemia that was not present on admission. This was felt secondary to procedures and blood draws. She is to follow up as an outpatient and consider iron studies if it does not improve. Her MCV is normal at this time. Discharge CBC is below.  On 4/3, she was seen by Dr. Antoine [name]. All data were reviewed. Her vital signs were stable. She was recovering well from her MI. She was gradually increasing her activity. Her INR was therapeutic and heparin could be discontinued. No further inpatient workup was indicated and she is considered stable for discharge, to follow up as an outpatient. She will have an office visit with Amanda [name] and get a CBC and INR next week.   Labs:  Lab Results  Component Value Date   WBC 5.6 01/21/2014   HGB 9.6* 01/21/2014   HCT 29.0* 01/21/2014   MCV 97.0 01/21/2014   PLT 223 01/21/2014     Recent Labs Lab 01/17/14 0130  NA 144  K 4.0  CL 104  CO2 29  BUN 21  CREATININE 0.69  CALCIUM 9.1  GLUCOSE 78    Recent Labs  01/21/14 0340  INR 2.87*   Lab Results  Component Value Date   HGBA1C 8.0* 01/15/2014  Lab Results  Component Value Date   TROPONINI 8.52* 01/16/2014    Cardiac Cath:  01/17/2014 ANGIOGRAPHY:  1. Left main: There was a99% eccentric stenosis and then was totally occluded. The LAD and circumflex which had been visualized prior to CABG surgery when no longer visualized. antegrade.  2. LAD: Totally occluded at its origin  3. Left circumflex: Totally occluded at its origin.  4. Right coronary artery: 90% mid stenosis  5.LIMA TO LAD: Calcified proximal  subclavian artery with patent LIMA flow to the LAD. The LAD was small caliber distally but appeared to be free of significant disease.  6. SVG TO circumflex marginal vessel: Widely patent. The circumflex filled retrograde up to the near ostial segment. The cervix marginal beyond the anastomosis was small caliber and had a focal 70-80% stenosis.  7. SVG TO RCA: Widely patent and anastomose into the distal LAD. There was excellent filling of the PDA and posterolateral vessels without stenosis  Left ventriculography revealed an ejection fraction of approximately 45%. Was mild distal anterolateral hypocontractility. There is a well-seated mechanical mitral valve with good leaflet mobility There was no mitral regurgitation.  IMPRESSION:  Mild LV dysfunction with an ejection fraction of 45% with distal anterolateral hypocontractility  Well-seated ATS mitral valve replacement without evidence for regurgitation and with good disc mobility  Severe native coronary obstructive disease with 99% left main stenosis and total of LAD circumflex occlusion, and 90% mid RCA stenosis.  Patent LIMA to LAD but with evidence for calcification of the proximal subclavian with narrowing of at least 30%.  Patent status vein graft to the circumflex marginal vessel with 70-80% stenosis in a small marginal vessel beyond the anastomosis  Patent saphenous vein graft to the distal right coronary artery  RECOMMENDATION:  Increase medical therapy. I suspect that the patient recent symptoms and mild enzyme leak may be related to proximal disease not supplied by grafts in this patient with an occluded distal left main.  EKG:  01/16/2014 SR, RBBB Vent. rate 77 BPM PR interval 160 ms QRS duration 128 ms QT/QTc 450/509 ms P-R-T axes 87 71 97   FOLLOW UP PLANS AND APPOINTMENTS Allergies  Allergen Reactions  . Codeine     REACTION: makes her "crazy"  . Penicillins Hives  . Tramadol     Hallucinations     Medication List           amLODipine-benazepril 5-20 MG per capsule  Commonly known as:  LOTREL  Take 1 capsule by mouth daily.     aspirin 81 MG EC tablet  Take 81 mg by mouth daily.     feeding supplement (RESOURCE BREEZE) Liqd  Take 1 Container by mouth daily after supper. Better than Ensure because it has lower Vit K level.     guaiFENesin 600 MG 12 hr tablet  Commonly known as:  MUCINEX  Take 600 mg by mouth 2 (two) times daily as needed for cough or to loosen phlegm.     isosorbide mononitrate 30 MG 24 hr tablet  Commonly known as:  IMDUR  Take 0.5 tablets (15 mg total) by mouth daily.     magnesium hydroxide 400 MG/5ML suspension  Commonly known as:  MILK OF MAGNESIA  Take 15 mLs by mouth at bedtime.     metFORMIN 500 MG (MOD) 24 hr tablet  Commonly known as:  GLUMETZA  Take 500 mg by mouth daily with breakfast.     metoprolol succinate 50 MG 24 hr tablet  Commonly known  as:  TOPROL-XL  Take 75 mg by mouth daily. Take with or immediately following a meal.     nitroGLYCERIN 0.4 MG SL tablet  Commonly known as:  NITROSTAT  Place 1 tablet (0.4 mg total) under the tongue every 5 (five) minutes as needed for chest pain.     rosuvastatin 20 MG tablet  Commonly known as:  CRESTOR  Take 1 tablet (20 mg total) by mouth at bedtime.     VITAMIN D (CHOLECALCIFEROL) PO  Take 1 tablet by mouth daily.     warfarin 4 MG tablet  Commonly known as:  COUMADIN  Take 2-4 mg by mouth daily. Takes 2mg  on Tuesday and 4mg  rest of week        Discharge Orders   Future Appointments Provider Department Dept Phone   01/27/2014 3:30 PM Antonieta Iba, MD [facility] [phone]   01/27/2014 3:50 PM [facility] Nurse [facility] [place] [phone]   Future Orders Complete By Expires   Diet - low sodium heart healthy  As directed    Diet Carb Modified  As directed    Increase activity slowly  As directed      Follow-up Information   Follow up with Julien Nordmann, MD On 01/27/2014.  (Lab work and MD visit at 3:30 pm. Do not have to be fasting, take all usual medications.)    Specialty:  Cardiology   Contact information:   [address] [place] [address] [phone]       BRING ALL MEDICATIONS WITH YOU TO FOLLOW UP APPOINTMENTS  Time spent with patient to include physician time: 41 min Signed: Theodore Demark, PA-C 01/21/2014, 12:24 PM Co-Sign MD  Patient seen and examined.  Plan as discussed in my rounding note for today and outlined above. Rollene Rotunda  01/21/2014  2:47 PM

## 2014-01-21 NOTE — Progress Notes (Signed)
SUBJECTIVE:  Denies chest pain and SOB.   Ready to go home.    PHYSICAL EXAM Filed Vitals:   01/20/14 0551 01/20/14 1335 01/20/14 2204 01/21/14 0520  BP: 113/45 113/57 130/53 134/51  Pulse: 76 83 75 76  Temp: 98.1 F (36.7 C) 97.8 F (36.6 C) 97.8 F (36.6 C) 98.1 F (36.7 C)  TempSrc: Oral Oral Oral Oral  Resp: 18 16 17 18   Height:      Weight: 106 lb 11.2 oz (48.4 kg)   105 lb 13.1 oz (48 kg)  SpO2: 96% 99% 97% 96%   General:  No distress Lungs: Clear Heart:  RRR. Mechanical S1 Abdomen:  Positive bowel sounds, no rebound no guarding Extremities:  No LEE  LABS:  Results for orders placed during the hospital encounter of 01/15/14 (from the past 24 hour(s))  GLUCOSE, CAPILLARY     Status: Abnormal   Collection Time    01/20/14 11:09 AM      Result Value Ref Range   Glucose-Capillary 229 (*) 70 - 99 mg/dL  HEPARIN LEVEL (UNFRACTIONATED)     Status: None   Collection Time    01/20/14  2:50 PM      Result Value Ref Range   Heparin Unfractionated 0.68  0.30 - 0.70 IU/mL  GLUCOSE, CAPILLARY     Status: Abnormal   Collection Time    01/20/14  4:27 PM      Result Value Ref Range   Glucose-Capillary 182 (*) 70 - 99 mg/dL  GLUCOSE, CAPILLARY     Status: Abnormal   Collection Time    01/20/14 10:04 PM      Result Value Ref Range   Glucose-Capillary 237 (*) 70 - 99 mg/dL   Comment 1 Documented in Chart     Comment 2 Notify RN    PROTIME-INR     Status: Abnormal   Collection Time    01/21/14  3:40 AM      Result Value Ref Range   Prothrombin Time 29.1 (*) 11.6 - 15.2 seconds   INR 2.87 (*) 0.00 - 1.49  CBC     Status: Abnormal   Collection Time    01/21/14  3:40 AM      Result Value Ref Range   WBC 5.6  4.0 - 10.5 K/uL   RBC 2.99 (*) 3.87 - 5.11 MIL/uL   Hemoglobin 9.6 (*) 12.0 - 15.0 g/dL   HCT 16.1 (*) 09.6 - 04.5 %   MCV 97.0  78.0 - 100.0 fL   MCH 32.1  26.0 - 34.0 pg   MCHC 33.1  30.0 - 36.0 g/dL   RDW 40.9  81.1 - 91.4 %   Platelets 223  150 -  400 K/uL  HEPARIN LEVEL (UNFRACTIONATED)     Status: None   Collection Time    01/21/14  3:40 AM      Result Value Ref Range   Heparin Unfractionated 0.50  0.30 - 0.70 IU/mL  GLUCOSE, CAPILLARY     Status: Abnormal   Collection Time    01/21/14  6:41 AM      Result Value Ref Range   Glucose-Capillary 165 (*) 70 - 99 mg/dL    Intake/Output Summary (Last 24 hours) at 01/21/14 0810 Last data filed at 01/20/14 1300  Gross per 24 hour  Intake    480 ml  Output      1 ml  Net    479 ml  ASSESSMENT AND PLAN:  NSTEMI (non-ST elevated myocardial infarction):  No further CP. Continue medical management: ASA, BB, CCB, nitrate, ACE and statin.   MITRAL VALVE REPLACEMENT, HX OF, 2004, 27 mmmechanical valve:  Warfarin now therapeutic.  OK to discharge.   Needs CBC next week send results to primary cardiologist.  Hgb is drifting down.   She would like to have follow up of her INR in Dr. Windell HummingbirdGollan's office.     Diabetes mellitus:  Continue current meds.      Hypertension:  Continue current meds.    Protein calorie malnutrition severe:  We appreciate the help of nutrition.         Amanda [name] 01/21/2014 8:10 AM

## 2014-01-24 ENCOUNTER — Telehealth: Payer: Self-pay

## 2014-01-24 NOTE — Telephone Encounter (Signed)
Pt nephew, also POA, called and has a question regarding pt medication, Lotrel, states pt was discharged on Friday, he states he put in a call to the on call Dr. On Saturday morning, 01/22/2014, at 9 am, and has still not heard a call back. Would like a nurse to call him.

## 2014-01-24 NOTE — Telephone Encounter (Signed)
Patient contacted regarding discharge from [facility] on 01/21/14.  Patient understands to follow up with Dr. Mariah MillingGollan on 01/27/14 at 3:30 at [facility]. Patient understands discharge instructions? yes Patient understands medications and regiment? yes Patient understands to bring all medications to this visit? yes

## 2014-01-24 NOTE — Telephone Encounter (Signed)
Spoke w/ pt's nephew Kathlene NovemberMike.  He reports that pt was d/c'd from [facility] on amlodipine 5/20mg , and although this is correct, another doctor's name is on the bottle. Advised him that is most likely a doctor that pt saw while inpt.  He reports that pt is experiencing quite a bit of hip pain due to lying in a hospital bed for 8 days and is interested in a pain/antiinflammatory. Advised him to try naproxen for OTC relief but would need to contact pt's PCP for something stronger.  He is agreeable to this.  He reports that he is quite upset, as he contacted the answering service on Sat am and has not been called back.  Pt sched to see Dr. Mariah MillingGollan this week.

## 2014-01-27 ENCOUNTER — Ambulatory Visit (INDEPENDENT_AMBULATORY_CARE_PROVIDER_SITE_OTHER): Payer: Medicare Other | Admitting: Cardiovascular Disease

## 2014-01-27 ENCOUNTER — Encounter: Payer: Self-pay | Admitting: Cardiovascular Disease

## 2014-01-27 ENCOUNTER — Other Ambulatory Visit: Payer: Medicare Other

## 2014-01-27 VITALS — BP 120/60 | HR 94 | Ht 60.0 in | Wt 111.5 lb

## 2014-01-27 DIAGNOSIS — I25810 Atherosclerosis of coronary artery bypass graft(s) without angina pectoris: Secondary | ICD-10-CM

## 2014-01-27 DIAGNOSIS — R0602 Shortness of breath: Secondary | ICD-10-CM

## 2014-01-27 DIAGNOSIS — D649 Anemia, unspecified: Secondary | ICD-10-CM

## 2014-01-27 DIAGNOSIS — E119 Type 2 diabetes mellitus without complications: Secondary | ICD-10-CM

## 2014-01-27 DIAGNOSIS — E785 Hyperlipidemia, unspecified: Secondary | ICD-10-CM

## 2014-01-27 DIAGNOSIS — I10 Essential (primary) hypertension: Secondary | ICD-10-CM

## 2014-01-27 DIAGNOSIS — Z9889 Other specified postprocedural states: Secondary | ICD-10-CM

## 2014-01-27 MED ORDER — ENOXAPARIN SODIUM 60 MG/0.6ML ~~LOC~~ SOLN: 60.0000 mg | Freq: Two times a day (BID) | SUBCUTANEOUS | Status: DC

## 2014-01-27 MED ORDER — WARFARIN SODIUM 4 MG PO TABS: 4.0000 mg | ORAL_TABLET | Freq: Every day | ORAL | Status: DC

## 2014-01-27 NOTE — Assessment & Plan Note (Signed)
Encouraged her to stay on her Crestor. Most recent lipid panel not available. Goal LDL less than 70

## 2014-01-27 NOTE — Patient Instructions (Addendum)
Please take warfarin 6 mg today Then 4 mg daily Check INR on Thursday  Please start lovenox twice a day for 7 days  Goal HBA1C is <6.5 Goal total cholesterol <150  We will schedule an echocardiogram next Thursday for mitral valve replacement  Please call us if you have new issues that need to be addressed before your next appt.  Your physician wants you to follow-up in: 6 months.  You will receive a reminder letter in the mail two months in advance. If you don't receive a letter, please call our office to schedule the follow-up appointment.

## 2014-01-27 NOTE — Progress Notes (Signed)
Patient ID: Amanda [name], female    DOB: 1925-08-11, 80 y.o.   MRN: [id number]  HPI Comments: 80 -year-old woman with a past medical history of coronary artery disease, bypass surgery in [facility] with a mechanical mitral valve on warfarin, with history of hypertension, hyperlipidemia who presents for routine followup.  She recently presented to  01/14/2014 with chest pain. Cardiac catheterization showed patent grafts, saphenous vein graft to the RCA and OM, patent graft to the mid LAD. She has severe three-vessel disease. Medical management was recommended.   Since her hospital admission, she has been feeling weak. Son who presents with her today reports that she is a high fall risk, is not able to do her ADLs with assistance devices. Unstable for a cane and she needs a walker. She is a full person assistance most of the time.  INR was checked today and was 1.2, recheck was 1.3 She's been taking 4 mg daily with 2 mg on Tuesdays   Previous total cholesterol 178, LDL 78 No recent lipid panel Most recent hemoglobin A1c greater than 8  EKG shows normal sinus rhythm with rate 94 beats per minute, Right bundle branch block, nonspecific ST changes in leads V3 through V6, PVCs and trigeminal pattern    Outpatient Encounter Prescriptions as of 01/27/2014  Medication Sig  . amLODipine-benazepril (LOTREL) 5-20 MG per capsule Take 1 capsule by mouth daily.  Marland Kitchen. aspirin 81 MG EC tablet Take 81 mg by mouth daily.    . feeding supplement, RESOURCE BREEZE, (RESOURCE BREEZE) LIQD Take 1 Container by mouth daily after supper. Better than Ensure because it has lower Vit K level.  Marland Kitchen. guaiFENesin (MUCINEX) 600 MG 12 hr tablet Take 600 mg by mouth 2 (two) times daily as needed for cough or to loosen phlegm.  . isosorbide mononitrate (IMDUR) 30 MG 24 hr tablet Take 0.5 tablets (15 mg total) by mouth daily.  . magnesium hydroxide (MILK OF MAGNESIA) 400 MG/5ML suspension Take 15 mLs by  mouth at bedtime.  . metFORMIN (GLUMETZA) 500 MG (MOD) 24 hr tablet Take 500 mg by mouth daily with breakfast.    . metoprolol succinate (TOPROL-XL) 50 MG 24 hr tablet Take 75 mg by mouth daily. Take with or immediately following a meal.  . nitroGLYCERIN (NITROSTAT) 0.4 MG SL tablet Place 1 tablet (0.4 mg total) under the tongue every 5 (five) minutes as needed for chest pain.  . rosuvastatin (CRESTOR) 20 MG tablet Take 1 tablet (20 mg total) by mouth at bedtime.  Marland Kitchen. warfarin (COUMADIN) 4 MG tablet Take 2-4 mg by mouth daily. Takes 2mg  on Tuesday and 4mg  rest of week    Review of Systems  Constitutional: Positive for fatigue.  HENT: Negative.   Eyes: Negative.   Respiratory: Negative.   Cardiovascular: Negative.   Gastrointestinal: Negative.   Endocrine: Negative.   Musculoskeletal: Positive for gait problem.  Skin: Negative.   Allergic/Immunologic: Negative.   Neurological: Positive for weakness.  Hematological: Negative.   Psychiatric/Behavioral: Negative.   All other systems reviewed and are negative.   BP 120/60  Pulse 94  Ht 5' (1.524 m)  Wt 111 lb 8 oz (50.576 kg)  BMI 21.78 kg/m2  Physical Exam  Nursing note and vitals reviewed. Constitutional: She is oriented to person, place, and time. She appears well-developed and well-nourished.  HENT:  Head: Normocephalic.  Nose: Nose normal.  Mouth/Throat: Oropharynx is clear and moist.  Eyes: Conjunctivae are normal. Pupils are equal, round,  and reactive to light.  Neck: Normal range of motion. Neck supple. No JVD present.  Cardiovascular: Normal rate, regular rhythm, S1 normal, S2 normal, normal heart sounds and intact distal pulses.  Exam reveals no gallop and no friction rub.   No murmur heard. Pulmonary/Chest: Effort normal and breath sounds normal. No respiratory distress. She has no wheezes. She has no rales. She exhibits no tenderness.  Abdominal: Soft. Bowel sounds are normal. She exhibits no distension. There is no  tenderness.  Musculoskeletal: Normal range of motion. She exhibits no edema and no tenderness.  Lymphadenopathy:    She has no cervical adenopathy.  Neurological: She is alert and oriented to person, place, and time. Coordination normal.  Skin: Skin is warm and dry. No rash noted. No erythema.  Psychiatric: She has a normal mood and affect. Her behavior is normal. Judgment and thought content normal.    Assessment and Plan

## 2014-01-27 NOTE — Assessment & Plan Note (Signed)
Reason catheterization with patent grafts. No further workup at this time

## 2014-01-27 NOTE — Assessment & Plan Note (Signed)
Blood pressure is well controlled on today's visit. No changes made to the medications. 

## 2014-01-27 NOTE — Assessment & Plan Note (Signed)
Most recent hemoglobin A1c 8.7. Recommended she talk with her primary care for goal hemoglobin A1c 6.5 or less

## 2014-01-27 NOTE — Assessment & Plan Note (Signed)
INR was checked today and was 1.2, 1.3 on recheck. We will start Lovenox 50 mg subcutaneous twice a day for 7 days with check of her INR next week. Suggested she stay on warfarin 6 mg tonight then 4 mg daily

## 2014-02-03 ENCOUNTER — Encounter (INDEPENDENT_AMBULATORY_CARE_PROVIDER_SITE_OTHER): Payer: Medicare Other

## 2014-02-03 ENCOUNTER — Ambulatory Visit (INDEPENDENT_AMBULATORY_CARE_PROVIDER_SITE_OTHER): Payer: Medicare Other

## 2014-02-03 ENCOUNTER — Other Ambulatory Visit: Payer: Self-pay

## 2014-02-03 ENCOUNTER — Other Ambulatory Visit (INDEPENDENT_AMBULATORY_CARE_PROVIDER_SITE_OTHER): Payer: Medicare Other

## 2014-02-03 ENCOUNTER — Other Ambulatory Visit (HOSPITAL_COMMUNITY): Payer: Self-pay | Admitting: *Deleted

## 2014-02-03 DIAGNOSIS — M79609 Pain in unspecified limb: Secondary | ICD-10-CM

## 2014-02-03 DIAGNOSIS — R0602 Shortness of breath: Secondary | ICD-10-CM

## 2014-02-03 DIAGNOSIS — I25810 Atherosclerosis of coronary artery bypass graft(s) without angina pectoris: Secondary | ICD-10-CM

## 2014-02-03 DIAGNOSIS — I724 Aneurysm of artery of lower extremity: Secondary | ICD-10-CM

## 2014-02-03 DIAGNOSIS — R1030 Lower abdominal pain, unspecified: Secondary | ICD-10-CM

## 2014-02-03 DIAGNOSIS — I359 Nonrheumatic aortic valve disorder, unspecified: Secondary | ICD-10-CM

## 2014-02-03 DIAGNOSIS — R109 Unspecified abdominal pain: Secondary | ICD-10-CM

## 2014-02-03 DIAGNOSIS — I729 Aneurysm of unspecified site: Secondary | ICD-10-CM

## 2014-02-03 DIAGNOSIS — E119 Type 2 diabetes mellitus without complications: Secondary | ICD-10-CM

## 2014-02-03 DIAGNOSIS — I059 Rheumatic mitral valve disease, unspecified: Secondary | ICD-10-CM

## 2014-02-03 MED ORDER — WARFARIN SODIUM 6 MG PO TABS: 6.0000 mg | ORAL_TABLET | Freq: Every day | ORAL | Status: DC

## 2014-02-03 MED ORDER — ENOXAPARIN SODIUM 60 MG/0.6ML ~~LOC~~ SOLN: 60.0000 mg | Freq: Two times a day (BID) | SUBCUTANEOUS | Status: DC

## 2014-02-03 NOTE — Patient Instructions (Addendum)
Please continue on lovenox injections twice a day for the next 10 days.  Please increase your coumadin up to 6mg  daily.  Recheck INR on Wednesday,  April 22 Appt w/ Dr. Wyn Quakerew @ [facility] 02/04/14 @ 9:30am

## 2014-02-03 NOTE — Progress Notes (Signed)
1.) Reason for visit: INR Check   2.) Name of MD requesting visit: Dr. Mariah MillingGollan  3.) INR: 1.2  4.) Assessment and plan per MD: Continue Lovenox injections BID x 10 days         Increase coumadin to 6mg  daily         Recheck INR on Wednesday, April 22         Appt w/ Dr. Wyn Quakerew @ [facility] 02/04/14 @ 9:30am

## 2014-02-04 ENCOUNTER — Telehealth: Payer: Self-pay

## 2014-02-04 NOTE — Telephone Encounter (Signed)
Pt nephew would like echo results

## 2014-02-09 ENCOUNTER — Ambulatory Visit: Payer: Self-pay | Admitting: Vascular Surgery

## 2014-02-09 ENCOUNTER — Telehealth: Payer: Self-pay

## 2014-02-09 DIAGNOSIS — I059 Rheumatic mitral valve disease, unspecified: Secondary | ICD-10-CM

## 2014-02-09 DIAGNOSIS — Z5181 Encounter for therapeutic drug level monitoring: Secondary | ICD-10-CM

## 2014-02-09 DIAGNOSIS — Z954 Presence of other heart-valve replacement: Secondary | ICD-10-CM

## 2014-02-09 LAB — BASIC METABOLIC PANEL
Anion Gap: 3 — ABNORMAL LOW (ref 7–16)
BUN: 26 mg/dL — AB (ref 7–18)
CALCIUM: 9.5 mg/dL (ref 8.5–10.1)
CREATININE: 0.6 mg/dL (ref 0.60–1.30)
Chloride: 104 mmol/L (ref 98–107)
Co2: 32 mmol/L (ref 21–32)
EGFR (African American): >60
EGFR (Non-African Amer.): >60
Glucose: 168 mg/dL — ABNORMAL HIGH (ref 65–99)
Osmolality: 286 (ref 275–301)
POTASSIUM: 3.5 mmol/L (ref 3.5–5.1)
SODIUM: 139 mmol/L (ref 136–145)

## 2014-02-09 LAB — PROTIME-INR
INR: 1.4
Prothrombin Time: 16.8 secs — ABNORMAL HIGH (ref 11.5–14.7)

## 2014-02-09 NOTE — Telephone Encounter (Signed)
Pt nephew called and states [facility] just checked her coumadin before her u/s and injection for aneurysm in cath site, and her coumadin was 1.4. Please call.(inr results are scanned in from [facility])

## 2014-02-09 NOTE — Telephone Encounter (Signed)
INR: 1.4 Please advise on coumadin dosing & whether she should continue lovenox injections.  Thank you.

## 2014-02-10 MED ORDER — ENOXAPARIN SODIUM 60 MG/0.6ML ~~LOC~~ SOLN: 60.0000 mg | Freq: Two times a day (BID) | SUBCUTANEOUS | Status: DC

## 2014-02-10 MED ORDER — WARFARIN SODIUM 4 MG PO TABS: 8.0000 mg | ORAL_TABLET | Freq: Every day | ORAL | Status: DC

## 2014-02-10 NOTE — Telephone Encounter (Signed)
Pt nephew calling back.

## 2014-02-10 NOTE — Telephone Encounter (Signed)
Pt's nephew presented to office inquiring about pt's coumadin. Dr. Mariah MillingGollan spoke w/ him in the waiting room. Advised that pt to continue lovenox injections, increase coumadin to 8mg  daily, and come back for INR check next Thursday. Pt's nephew is agreeable to this and will call w/ further questions or concerns.  Dr. Mariah MillingGollan asked that I forward this to the coumadin clinic for their review, as pt's INR remains low despite increase in coumadin.

## 2014-02-10 NOTE — Telephone Encounter (Signed)
Called spoke with pt's son, advised to have pt take 10mg  of Warfarin today and tomorrow, then 8mg  daily in an effort to get pt off of Lovenox, as he has been taking for over 2 weeks now.  Continue on Lovenox BID until INR greater 2.0.  Appointment made in [place] office for Monday 02/14/14 to recheck INR.  Pt only has 2 doses of Lovenox left and will need prior auth done for refill per [employer] insurance and pt's son.  Will call to try and expedite process [phone].  Called [employer] got verbal prior auth approval for Enoxaparin 60mg  syringes.  Son aware should be able to refill later today per Tresa EndoKelly.

## 2014-02-10 NOTE — Telephone Encounter (Signed)
Great, thank you!

## 2014-02-14 ENCOUNTER — Ambulatory Visit (INDEPENDENT_AMBULATORY_CARE_PROVIDER_SITE_OTHER): Payer: Medicare Other

## 2014-02-14 DIAGNOSIS — Z954 Presence of other heart-valve replacement: Secondary | ICD-10-CM

## 2014-02-14 DIAGNOSIS — Z9889 Other specified postprocedural states: Secondary | ICD-10-CM

## 2014-02-14 DIAGNOSIS — Z5181 Encounter for therapeutic drug level monitoring: Secondary | ICD-10-CM

## 2014-02-14 DIAGNOSIS — I059 Rheumatic mitral valve disease, unspecified: Secondary | ICD-10-CM | POA: Insufficient documentation

## 2014-02-14 LAB — POCT INR: INR: 3

## 2014-02-17 ENCOUNTER — Encounter (INDEPENDENT_AMBULATORY_CARE_PROVIDER_SITE_OTHER): Payer: Medicare Other

## 2014-02-17 ENCOUNTER — Ambulatory Visit (INDEPENDENT_AMBULATORY_CARE_PROVIDER_SITE_OTHER): Payer: Medicare Other | Admitting: Pharmacist Clinician (PhC)/ Clinical Pharmacy Specialist

## 2014-02-17 DIAGNOSIS — Z5181 Encounter for therapeutic drug level monitoring: Secondary | ICD-10-CM

## 2014-02-17 DIAGNOSIS — I059 Rheumatic mitral valve disease, unspecified: Secondary | ICD-10-CM

## 2014-02-17 DIAGNOSIS — Z954 Presence of other heart-valve replacement: Secondary | ICD-10-CM

## 2014-02-17 DIAGNOSIS — Z9889 Other specified postprocedural states: Secondary | ICD-10-CM

## 2014-02-17 LAB — POCT INR: INR: 1.5

## 2014-02-21 ENCOUNTER — Ambulatory Visit (INDEPENDENT_AMBULATORY_CARE_PROVIDER_SITE_OTHER): Payer: Medicare Other | Admitting: *Deleted

## 2014-02-21 DIAGNOSIS — Z9889 Other specified postprocedural states: Secondary | ICD-10-CM

## 2014-02-21 DIAGNOSIS — Z954 Presence of other heart-valve replacement: Secondary | ICD-10-CM

## 2014-02-21 DIAGNOSIS — Z5181 Encounter for therapeutic drug level monitoring: Secondary | ICD-10-CM

## 2014-02-21 DIAGNOSIS — I059 Rheumatic mitral valve disease, unspecified: Secondary | ICD-10-CM

## 2014-02-21 LAB — POCT INR: INR: 2.2

## 2014-02-22 ENCOUNTER — Other Ambulatory Visit: Payer: Self-pay | Admitting: Cardiovascular Disease

## 2014-02-22 NOTE — Telephone Encounter (Signed)
Please review for refill. Thanks!  

## 2014-03-02 ENCOUNTER — Ambulatory Visit (INDEPENDENT_AMBULATORY_CARE_PROVIDER_SITE_OTHER): Payer: Medicare Other

## 2014-03-02 DIAGNOSIS — Z954 Presence of other heart-valve replacement: Secondary | ICD-10-CM

## 2014-03-02 DIAGNOSIS — Z9889 Other specified postprocedural states: Secondary | ICD-10-CM

## 2014-03-02 DIAGNOSIS — I059 Rheumatic mitral valve disease, unspecified: Secondary | ICD-10-CM

## 2014-03-02 DIAGNOSIS — Z5181 Encounter for therapeutic drug level monitoring: Secondary | ICD-10-CM

## 2014-03-02 LAB — POCT INR: INR: 2.1

## 2014-03-02 MED ORDER — METOPROLOL SUCCINATE ER 50 MG PO TB24: 75.0000 mg | ORAL_TABLET | Freq: Every day | ORAL | Status: DC

## 2014-03-02 MED ORDER — ISOSORBIDE MONONITRATE ER 30 MG PO TB24: 15.0000 mg | ORAL_TABLET | Freq: Every day | ORAL | Status: DC

## 2014-03-09 ENCOUNTER — Ambulatory Visit (INDEPENDENT_AMBULATORY_CARE_PROVIDER_SITE_OTHER): Payer: Medicare Other

## 2014-03-09 DIAGNOSIS — Z954 Presence of other heart-valve replacement: Secondary | ICD-10-CM

## 2014-03-09 DIAGNOSIS — I059 Rheumatic mitral valve disease, unspecified: Secondary | ICD-10-CM

## 2014-03-09 DIAGNOSIS — Z5181 Encounter for therapeutic drug level monitoring: Secondary | ICD-10-CM

## 2014-03-09 DIAGNOSIS — Z9889 Other specified postprocedural states: Secondary | ICD-10-CM

## 2014-03-09 LAB — POCT INR: INR: 2.5

## 2014-03-23 ENCOUNTER — Ambulatory Visit (INDEPENDENT_AMBULATORY_CARE_PROVIDER_SITE_OTHER): Payer: Medicare Other

## 2014-03-23 DIAGNOSIS — I059 Rheumatic mitral valve disease, unspecified: Secondary | ICD-10-CM

## 2014-03-23 DIAGNOSIS — Z954 Presence of other heart-valve replacement: Secondary | ICD-10-CM

## 2014-03-23 DIAGNOSIS — Z5181 Encounter for therapeutic drug level monitoring: Secondary | ICD-10-CM

## 2014-03-23 DIAGNOSIS — Z9889 Other specified postprocedural states: Secondary | ICD-10-CM

## 2014-03-23 LAB — POCT INR: INR: 3.3

## 2014-04-05 ENCOUNTER — Ambulatory Visit (INDEPENDENT_AMBULATORY_CARE_PROVIDER_SITE_OTHER): Payer: Medicare Other | Admitting: Pharmacist Clinician (PhC)/ Clinical Pharmacy Specialist

## 2014-04-05 DIAGNOSIS — Z9889 Other specified postprocedural states: Secondary | ICD-10-CM

## 2014-04-05 DIAGNOSIS — Z954 Presence of other heart-valve replacement: Secondary | ICD-10-CM

## 2014-04-05 DIAGNOSIS — I059 Rheumatic mitral valve disease, unspecified: Secondary | ICD-10-CM

## 2014-04-05 DIAGNOSIS — Z5181 Encounter for therapeutic drug level monitoring: Secondary | ICD-10-CM

## 2014-04-05 LAB — POCT INR: INR: 3.8

## 2014-04-21 ENCOUNTER — Ambulatory Visit (INDEPENDENT_AMBULATORY_CARE_PROVIDER_SITE_OTHER): Payer: Medicare Other | Admitting: Pharmacist

## 2014-04-21 DIAGNOSIS — Z9889 Other specified postprocedural states: Secondary | ICD-10-CM

## 2014-04-21 DIAGNOSIS — Z5181 Encounter for therapeutic drug level monitoring: Secondary | ICD-10-CM

## 2014-04-21 DIAGNOSIS — I059 Rheumatic mitral valve disease, unspecified: Secondary | ICD-10-CM

## 2014-04-21 DIAGNOSIS — Z954 Presence of other heart-valve replacement: Secondary | ICD-10-CM

## 2014-04-21 LAB — POCT INR: INR: 3.1

## 2014-05-04 ENCOUNTER — Ambulatory Visit (INDEPENDENT_AMBULATORY_CARE_PROVIDER_SITE_OTHER): Payer: Medicare Other

## 2014-05-04 DIAGNOSIS — Z9889 Other specified postprocedural states: Secondary | ICD-10-CM

## 2014-05-04 DIAGNOSIS — Z954 Presence of other heart-valve replacement: Secondary | ICD-10-CM

## 2014-05-04 DIAGNOSIS — Z5181 Encounter for therapeutic drug level monitoring: Secondary | ICD-10-CM

## 2014-05-04 DIAGNOSIS — I059 Rheumatic mitral valve disease, unspecified: Secondary | ICD-10-CM

## 2014-05-04 LAB — POCT INR: INR: 3

## 2014-05-25 ENCOUNTER — Ambulatory Visit (INDEPENDENT_AMBULATORY_CARE_PROVIDER_SITE_OTHER): Payer: Medicare Other

## 2014-05-25 DIAGNOSIS — Z9889 Other specified postprocedural states: Secondary | ICD-10-CM

## 2014-05-25 DIAGNOSIS — I059 Rheumatic mitral valve disease, unspecified: Secondary | ICD-10-CM

## 2014-05-25 DIAGNOSIS — Z5181 Encounter for therapeutic drug level monitoring: Secondary | ICD-10-CM

## 2014-05-25 DIAGNOSIS — Z954 Presence of other heart-valve replacement: Secondary | ICD-10-CM

## 2014-05-25 LAB — POCT INR: INR: 2.3

## 2014-06-24 ENCOUNTER — Ambulatory Visit (INDEPENDENT_AMBULATORY_CARE_PROVIDER_SITE_OTHER): Payer: Medicare Other

## 2014-06-24 DIAGNOSIS — Z954 Presence of other heart-valve replacement: Secondary | ICD-10-CM

## 2014-06-24 DIAGNOSIS — I059 Rheumatic mitral valve disease, unspecified: Secondary | ICD-10-CM

## 2014-06-24 DIAGNOSIS — Z9889 Other specified postprocedural states: Secondary | ICD-10-CM

## 2014-06-24 DIAGNOSIS — Z5181 Encounter for therapeutic drug level monitoring: Secondary | ICD-10-CM

## 2014-06-24 LAB — POCT INR: INR: 2.6

## 2014-07-25 ENCOUNTER — Other Ambulatory Visit: Payer: Self-pay | Admitting: Cardiovascular Disease

## 2014-07-25 NOTE — Telephone Encounter (Signed)
Please review for refill, Thank you. 

## 2014-07-27 ENCOUNTER — Ambulatory Visit (INDEPENDENT_AMBULATORY_CARE_PROVIDER_SITE_OTHER): Payer: Medicare Other

## 2014-07-27 DIAGNOSIS — Z9889 Other specified postprocedural states: Secondary | ICD-10-CM

## 2014-07-27 DIAGNOSIS — Z5181 Encounter for therapeutic drug level monitoring: Secondary | ICD-10-CM

## 2014-07-27 DIAGNOSIS — Z954 Presence of other heart-valve replacement: Secondary | ICD-10-CM

## 2014-07-27 DIAGNOSIS — I059 Rheumatic mitral valve disease, unspecified: Secondary | ICD-10-CM

## 2014-07-27 DIAGNOSIS — Z952 Presence of prosthetic heart valve: Secondary | ICD-10-CM

## 2014-07-27 LAB — POCT INR: INR: 3

## 2014-08-24 ENCOUNTER — Encounter: Payer: Self-pay | Admitting: Cardiovascular Disease

## 2014-08-24 ENCOUNTER — Ambulatory Visit (INDEPENDENT_AMBULATORY_CARE_PROVIDER_SITE_OTHER): Payer: Medicare Other | Admitting: Cardiovascular Disease

## 2014-08-24 ENCOUNTER — Ambulatory Visit (INDEPENDENT_AMBULATORY_CARE_PROVIDER_SITE_OTHER): Payer: Medicare Other

## 2014-08-24 VITALS — BP 120/62 | HR 82 | Ht 62.0 in | Wt 102.0 lb

## 2014-08-24 DIAGNOSIS — Z9889 Other specified postprocedural states: Secondary | ICD-10-CM

## 2014-08-24 DIAGNOSIS — I25708 Atherosclerosis of coronary artery bypass graft(s), unspecified, with other forms of angina pectoris: Secondary | ICD-10-CM

## 2014-08-24 DIAGNOSIS — E785 Hyperlipidemia, unspecified: Secondary | ICD-10-CM

## 2014-08-24 DIAGNOSIS — Z954 Presence of other heart-valve replacement: Secondary | ICD-10-CM

## 2014-08-24 DIAGNOSIS — Z5181 Encounter for therapeutic drug level monitoring: Secondary | ICD-10-CM

## 2014-08-24 DIAGNOSIS — I059 Rheumatic mitral valve disease, unspecified: Secondary | ICD-10-CM

## 2014-08-24 DIAGNOSIS — I10 Essential (primary) hypertension: Secondary | ICD-10-CM

## 2014-08-24 DIAGNOSIS — Z952 Presence of prosthetic heart valve: Secondary | ICD-10-CM

## 2014-08-24 DIAGNOSIS — E1159 Type 2 diabetes mellitus with other circulatory complications: Secondary | ICD-10-CM

## 2014-08-24 LAB — POCT INR: INR: 1.7

## 2014-08-24 MED ORDER — NITROGLYCERIN 0.4 MG SL SUBL: 0.4000 mg | SUBLINGUAL_TABLET | SUBLINGUAL | Status: DC | PRN

## 2014-08-24 NOTE — Patient Instructions (Addendum)
Your next appointment will be scheduled in our new office located at :  [facility]  [address], [address]  [place], [place] [address]  You are doing well. No medication changes were made.  Please call us if you have new issues that need to be addressed before your next appt.  Your physician wants you to follow-up in: 6 months.  You will receive a reminder letter in the mail two months in advance. If you don't receive a letter, please call our office to schedule the follow-up appointment.   

## 2014-08-24 NOTE — Progress Notes (Signed)
Patient ID: Amanda [name], female    DOB: [birth year], 80 y.o.   MRN: [id number]  HPI Comments: 80 -year-old woman with a past medical history of coronary artery disease, bypass [facility] with a mechanical mitral valve on warfarin, with history of hypertension, hyperlipidemia who presents for routine followup.  Prior hospitalization at [facility] 01/14/2014 with chest pain. Cardiac catheterization showed patent grafts, saphenous vein graft to the RCA and OM, patent graft to the mid LAD. She has severe three-vessel disease. Medical management was recommended. In follow-up she was feeling weak. high fall risk,  not able to do her ADLs with assistance devices. Needing a walker and full assistance  In follow-up today, she reports that her weight is up 10 pounds from her prior clinic visit, her legs feel stronger, no recent falls. She is walking with a cane. She does still have a walker. She lives with her son but is relatively independent She denies any significant chest pain, shortness of breath or leg edema Tolerating her medications INR continues to be up and down, today was 1.7, previously was 3   Previous total cholesterol 178, LDL 78 No recent lipid panel Previous hemoglobin A1c greater than 8  EKG shows normal sinus rhythm with rate 82 beats per minute,  nonspecific ST changes in leads V3 through V6, PVCs    Outpatient Encounter Prescriptions as of 08/24/2014  Medication Sig  . amLODipine-benazepril (LOTREL) 5-20 MG per capsule Take 1 capsule by mouth daily.  Marland Kitchen. aspirin 81 MG EC tablet Take 81 mg by mouth daily.    . feeding supplement, RESOURCE BREEZE, (RESOURCE BREEZE) LIQD Take 1 Container by mouth daily after supper. Better than Ensure because it has lower Vit K level.  Marland Kitchen. glimepiride (AMARYL) 2 MG tablet Take 2 mg by mouth daily with breakfast.   . guaiFENesin (MUCINEX) 600 MG 12 hr tablet Take 600 mg by mouth 2 (two) times daily as needed for cough or  to loosen phlegm.  . isosorbide mononitrate (IMDUR) 30 MG 24 hr tablet Take 0.5 tablets (15 mg total) by mouth daily.  . magnesium hydroxide (MILK OF MAGNESIA) 400 MG/5ML suspension Take 15 mLs by mouth at bedtime.  . metFORMIN (GLUMETZA) 500 MG (MOD) 24 hr tablet Take 500 mg by mouth daily with breakfast.    . metoprolol succinate (TOPROL-XL) 50 MG 24 hr tablet Take 75 mg by mouth daily.   . nitroGLYCERIN (NITROSTAT) 0.4 MG SL tablet Place 1 tablet (0.4 mg total) under the tongue every 5 (five) minutes as needed for chest pain.  Marland Kitchen. omeprazole (PRILOSEC) 20 MG capsule Take 20 mg by mouth daily.  Marland Kitchen. warfarin (COUMADIN) 6 MG tablet TAKE AS DIRECTED BY ANTICOAGULATION CLINIC  . rosuvastatin (CRESTOR) 20 MG tablet Take 1 tablet (20 mg total) by mouth at bedtime.   Social history;  reports that she has never smoked. Her smokeless tobacco use includes Snuff. She reports that she does not drink alcohol or use illicit drugs.   Review of Systems  Musculoskeletal: Positive for gait problem.  Neurological: Positive for weakness.  All other systems reviewed and are negative.   BP 120/62 mmHg  Pulse 82  Ht 5\' 2"  (1.575 m)  Wt 102 lb (46.267 kg)  BMI 18.65 kg/m2  Physical Exam  Constitutional: She is oriented to person, place, and time. She appears well-developed and well-nourished.  HENT:  Head: Normocephalic.  Nose: Nose normal.  Mouth/Throat: Oropharynx is clear and moist.  Eyes:  Conjunctivae are normal. Pupils are equal, round, and reactive to light.  Neck: Normal range of motion. Neck supple. No JVD present.  Cardiovascular: Normal rate, regular rhythm, S1 normal, S2 normal, normal heart sounds and intact distal pulses.  Exam reveals no gallop and no friction rub.   No murmur heard. Pulmonary/Chest: Effort normal and breath sounds normal. No respiratory distress. She has no wheezes. She has no rales. She exhibits no tenderness.  Abdominal: Soft. Bowel sounds are normal. She exhibits no  distension. There is no tenderness.  Musculoskeletal: Normal range of motion. She exhibits no edema or tenderness.  Lymphadenopathy:    She has no cervical adenopathy.  Neurological: She is alert and oriented to person, place, and time. Coordination normal.  Skin: Skin is warm and dry. No rash noted. No erythema.  Psychiatric: She has a normal mood and affect. Her behavior is normal. Judgment and thought content normal.    Assessment and Plan  Nursing note and vitals reviewed.

## 2014-08-24 NOTE — Assessment & Plan Note (Signed)
Results of the echocardiogram were reviewed with her again in detail from 2015. No need for further evaluation at this time

## 2014-08-24 NOTE — Assessment & Plan Note (Signed)
Blood pressure is well controlled on today's visit. No changes made to the medications. 

## 2014-08-24 NOTE — Assessment & Plan Note (Signed)
We have encouraged continued exercise, careful diet management in an effort to lose weight. 

## 2014-08-24 NOTE — Assessment & Plan Note (Signed)
Recommended that she stay on her Crestor. Goal LDL less than 70 

## 2014-08-24 NOTE — Assessment & Plan Note (Signed)
Prior catheterization March 2015 Currently with no symptoms of angina. No further workup at this time. Continue current medication regimen.

## 2014-09-07 ENCOUNTER — Ambulatory Visit (INDEPENDENT_AMBULATORY_CARE_PROVIDER_SITE_OTHER): Payer: Medicare Other

## 2014-09-07 DIAGNOSIS — Z9889 Other specified postprocedural states: Secondary | ICD-10-CM

## 2014-09-07 DIAGNOSIS — I059 Rheumatic mitral valve disease, unspecified: Secondary | ICD-10-CM

## 2014-09-07 DIAGNOSIS — Z5181 Encounter for therapeutic drug level monitoring: Secondary | ICD-10-CM

## 2014-09-07 DIAGNOSIS — Z952 Presence of prosthetic heart valve: Secondary | ICD-10-CM

## 2014-09-07 DIAGNOSIS — Z954 Presence of other heart-valve replacement: Secondary | ICD-10-CM

## 2014-09-07 LAB — POCT INR: INR: 2

## 2014-09-29 ENCOUNTER — Encounter (HOSPITAL_COMMUNITY): Payer: Self-pay | Admitting: Cardiovascular Disease

## 2014-10-05 ENCOUNTER — Ambulatory Visit (INDEPENDENT_AMBULATORY_CARE_PROVIDER_SITE_OTHER): Payer: Medicare Other

## 2014-10-05 DIAGNOSIS — Z952 Presence of prosthetic heart valve: Secondary | ICD-10-CM

## 2014-10-05 DIAGNOSIS — Z5181 Encounter for therapeutic drug level monitoring: Secondary | ICD-10-CM

## 2014-10-05 DIAGNOSIS — I059 Rheumatic mitral valve disease, unspecified: Secondary | ICD-10-CM

## 2014-10-05 DIAGNOSIS — Z954 Presence of other heart-valve replacement: Secondary | ICD-10-CM

## 2014-10-05 DIAGNOSIS — Z9889 Other specified postprocedural states: Secondary | ICD-10-CM

## 2014-10-05 LAB — POCT INR: INR: 3.2

## 2014-11-02 ENCOUNTER — Ambulatory Visit (INDEPENDENT_AMBULATORY_CARE_PROVIDER_SITE_OTHER): Payer: Medicare Other

## 2014-11-02 DIAGNOSIS — Z9889 Other specified postprocedural states: Secondary | ICD-10-CM

## 2014-11-02 DIAGNOSIS — Z952 Presence of prosthetic heart valve: Secondary | ICD-10-CM

## 2014-11-02 DIAGNOSIS — Z954 Presence of other heart-valve replacement: Secondary | ICD-10-CM

## 2014-11-02 DIAGNOSIS — Z5181 Encounter for therapeutic drug level monitoring: Secondary | ICD-10-CM

## 2014-11-02 DIAGNOSIS — I059 Rheumatic mitral valve disease, unspecified: Secondary | ICD-10-CM

## 2014-11-02 LAB — POCT INR: INR: 2.5

## 2014-11-29 ENCOUNTER — Telehealth: Payer: Self-pay | Admitting: *Deleted

## 2014-11-29 NOTE — Telephone Encounter (Signed)
Returned call to pt's daughter, she reports her mother has not had any medication changes, no bleeding, and diet has been stable.  INR 2.5 on 11/02/14, advised to skip today's dosage of Coumadin, then resume same dosage 6mg  daily except 3mg  on Mondays.  Made appt to recheck INR in 2 weeks on 12/14/14.

## 2014-11-29 NOTE — Telephone Encounter (Signed)
Pt daughter called stating that pt got her coumadin checked today at her [facility]CP [facility]

## 2014-11-29 NOTE — Telephone Encounter (Signed)
Pt daughter called stating that pt got her coumadin checked today at her PCP [facility] in [place]  Her levels today was 3.8, and she knows we like it between 2-3. Wanted to know what to do. If she did not hear from us today she asked her pcp and they told her to hold coumadin for tonight.  Please call patient.

## 2014-12-13 LAB — LAB REPORT - SCANNED: INR: 2.9

## 2014-12-13 LAB — POCT INR: INR: 2.9

## 2014-12-14 ENCOUNTER — Telehealth: Payer: Self-pay | Admitting: *Deleted

## 2014-12-14 ENCOUNTER — Ambulatory Visit (INDEPENDENT_AMBULATORY_CARE_PROVIDER_SITE_OTHER): Payer: Medicare Other | Admitting: Cardiovascular Disease

## 2014-12-14 DIAGNOSIS — Z5181 Encounter for therapeutic drug level monitoring: Secondary | ICD-10-CM

## 2014-12-14 DIAGNOSIS — I059 Rheumatic mitral valve disease, unspecified: Secondary | ICD-10-CM

## 2014-12-14 DIAGNOSIS — Z9889 Other specified postprocedural states: Secondary | ICD-10-CM

## 2014-12-14 NOTE — Telephone Encounter (Signed)
Forwarding to [facility]. 

## 2014-12-14 NOTE — Telephone Encounter (Signed)
Results received see anticoagulation note in Epic. 

## 2014-12-14 NOTE — Telephone Encounter (Signed)
Pt caretaker is calling about if we can give her the instructions on to what to do on coumadin.   Coumadin 2.9.  It was taken yesterday afternoon.  [facility].   Please advise.

## 2014-12-19 ENCOUNTER — Encounter: Payer: Self-pay | Admitting: Cardiovascular Disease

## 2015-01-11 ENCOUNTER — Ambulatory Visit (INDEPENDENT_AMBULATORY_CARE_PROVIDER_SITE_OTHER): Payer: Medicare Other

## 2015-01-11 DIAGNOSIS — Z952 Presence of prosthetic heart valve: Secondary | ICD-10-CM

## 2015-01-11 DIAGNOSIS — Z9889 Other specified postprocedural states: Secondary | ICD-10-CM | POA: Diagnosis not present

## 2015-01-11 DIAGNOSIS — Z954 Presence of other heart-valve replacement: Secondary | ICD-10-CM

## 2015-01-11 DIAGNOSIS — I059 Rheumatic mitral valve disease, unspecified: Secondary | ICD-10-CM | POA: Diagnosis not present

## 2015-01-11 DIAGNOSIS — Z5181 Encounter for therapeutic drug level monitoring: Secondary | ICD-10-CM

## 2015-01-11 LAB — POCT INR: INR: 2.8

## 2015-02-09 ENCOUNTER — Telehealth: Payer: Self-pay

## 2015-02-09 NOTE — Telephone Encounter (Signed)
Spoke w/ Joyce GrossKay.  Advised her that pt is sched to see Dr. Mariah MillingGollan on 02/22/15 and that we cannot offer any sooner appt than that, so make sure that keeps that. Advised her to contact PCP in the meantime to make sure that is not a GI issue.  She states that pt did see PCP and she suggested that it may be cardiac issue.  Advised her that I will call her in the event of a cancellation.

## 2015-02-09 NOTE — Telephone Encounter (Signed)
Left message for Kay to call back. 

## 2015-02-09 NOTE — Telephone Encounter (Signed)
Niece states over the last 8 months, pt gets nauseated, sometimes vomits and wants to know if this has to do with her heart. No correlation to her heart, i.e., BP. Thinks maybe her meds need to be "tweeked". Please call.

## 2015-02-11 NOTE — Op Note (Signed)
PATIENT NAME:  [name], Amanda [name] DATE OF BIRTH:  1925/03/17  DATE OF PROCEDURE:  02/09/2014  PREOPERATIVE DIAGNOSES: 1.  Right femoral artery pseudoaneurysm after previous cardiac catheterization.  2.  Mechanical mitral valve requiring long-term anticoagulation. 3.  Coronary artery disease.  POSTOPERATIVE DIAGNOSES: 1.  Right femoral artery pseudoaneurysm after previous cardiac catheterization.  2.  Mechanical mitral valve requiring long-term anticoagulation. 3.  Coronary artery disease.  PROCEDURE:  Ultrasound guided thrombin injection of right femoral artery pseudoaneurysm.   SURGEON: Annice NeedyJason S. Addison Whidbee, MD.   ANESTHESIA: Local with moderate conscious sedation.   ESTIMATED BLOOD LOSS: Minimal.   INDICATION FOR PROCEDURE: An 81 year old white female who we saw in the office earlier this week for a right femoral pseudoaneurysm. She has previous history of coronary artery disease and mechanical mitral valve placement and requires long-term anticoagulation. She had a cardiac catheterization performed and noticed a bulge in her right groin and ultrasound performed by her cardiologist showed a femoral pseudoaneurysm. She is brought in for treatment of the pseudoaneurysm. Risks and benefits were discussed and multiple different options for treatment were discussed and it was decided on an ultrasound guided thrombin injection, if technically feasible.   DESCRIPTION OF PROCEDURE: The patient is brought to the vascular suite. The right groin was sterilely prepped and draped and sterile surgical field was created. Ultrasound was used to visualize the pseudoaneurysm which was of moderate size. This did appear to be partially thrombosed on our initial imaging with duplex. I anesthetized the area and then under direct ultrasound guidance accessed the pseudoaneurysm with a 23 gauge needle and injected thrombin into the pseudoaneurysm. Pressure was then held on the site and sterile dressing was  placed. The patient tolerated the procedure well and was taken to the recovery room in stable condition.    ____________________________ Annice NeedyJason S. Emmalise Huard, MD jsd:ce D: 02/09/2014 15:47:37 ET T: 02/09/2014 19:06:51 ET JOB#: [id number]  cc: Annice NeedyJason S. Stephaie Dardis, MD, <Dictator> Antonieta Ibaimothy J. Gollan, MD Annice NeedyJASON S Lylith Bebeau MD ELECTRONICALLY SIGNED 02/14/2014 9:36

## 2015-02-20 ENCOUNTER — Other Ambulatory Visit: Payer: Self-pay | Admitting: Cardiovascular Disease

## 2015-02-21 NOTE — Telephone Encounter (Signed)
LMOM to verify dose of metoprolol and how she is taking it.

## 2015-02-22 ENCOUNTER — Ambulatory Visit (INDEPENDENT_AMBULATORY_CARE_PROVIDER_SITE_OTHER): Payer: Medicare Other

## 2015-02-22 ENCOUNTER — Encounter: Payer: Self-pay | Admitting: Cardiovascular Disease

## 2015-02-22 ENCOUNTER — Ambulatory Visit (INDEPENDENT_AMBULATORY_CARE_PROVIDER_SITE_OTHER): Payer: Medicare Other | Admitting: Cardiovascular Disease

## 2015-02-22 VITALS — BP 140/62 | HR 87 | Ht 62.0 in | Wt 123.5 lb

## 2015-02-22 DIAGNOSIS — Z9889 Other specified postprocedural states: Secondary | ICD-10-CM

## 2015-02-22 DIAGNOSIS — E785 Hyperlipidemia, unspecified: Secondary | ICD-10-CM | POA: Diagnosis not present

## 2015-02-22 DIAGNOSIS — E1159 Type 2 diabetes mellitus with other circulatory complications: Secondary | ICD-10-CM

## 2015-02-22 DIAGNOSIS — I10 Essential (primary) hypertension: Secondary | ICD-10-CM

## 2015-02-22 DIAGNOSIS — Z5181 Encounter for therapeutic drug level monitoring: Secondary | ICD-10-CM | POA: Diagnosis not present

## 2015-02-22 DIAGNOSIS — I059 Rheumatic mitral valve disease, unspecified: Secondary | ICD-10-CM

## 2015-02-22 DIAGNOSIS — Z954 Presence of other heart-valve replacement: Secondary | ICD-10-CM

## 2015-02-22 DIAGNOSIS — R0602 Shortness of breath: Secondary | ICD-10-CM | POA: Diagnosis not present

## 2015-02-22 DIAGNOSIS — I25708 Atherosclerosis of coronary artery bypass graft(s), unspecified, with other forms of angina pectoris: Secondary | ICD-10-CM

## 2015-02-22 DIAGNOSIS — R110 Nausea: Secondary | ICD-10-CM | POA: Insufficient documentation

## 2015-02-22 DIAGNOSIS — Z952 Presence of prosthetic heart valve: Secondary | ICD-10-CM

## 2015-02-22 LAB — POCT INR: INR: 2.8

## 2015-02-22 MED ORDER — ONDANSETRON HCL 4 MG PO TABS: 4.0000 mg | ORAL_TABLET | Freq: Three times a day (TID) | ORAL | Status: DC | PRN

## 2015-02-22 NOTE — Patient Instructions (Signed)
You are doing well. No medication changes were made.  For nausea, Try tums, check the blood pressure, Soda, Nausea pill  Please call us if you have new issues that need to be addressed before your next appt.  Your physician wants you to follow-up in: 6 months.  You will receive a reminder letter in the mail two months in advance. If you don't receive a letter, please call our office to schedule the follow-up appointment.

## 2015-02-22 NOTE — Assessment & Plan Note (Signed)
Recommended that she stay on her Crestor. Goal LDL less than 70 

## 2015-02-22 NOTE — Assessment & Plan Note (Signed)
Blood pressure is well controlled on today's visit. No changes made to the medications. 

## 2015-02-22 NOTE — Progress Notes (Signed)
Patient ID: Amanda [name], female    DOB: 1925-06-16, 81 y.o.   MRN: [name]  HPI Comments: 81 -year-old woman with a past medical history of coronary artery disease, [name] with a mechanical mitral valve on warfarin, with history of hypertension, hyperlipidemia who presents for routine followup of her coronary artery disease.  In follow-up today, she reports that she is doing relatively well. Legs are weak, walks with a walker, no falls. Weight has increased 20 pounds from her prior clinic visit. She has felt better, eating more.  Family presents with her today and reports that she has had 3 episodes of nausea over the past 8 months. Etiology unclear Episodes last for several minutes at a time, sometimes with vomiting. Typically presenting at rest. She denies any chest pain or dizziness with her episodes. Sometimes feels hot. No diarrhea or change in bowel habits She's been taking omeprazole 20 mg daily on a consistent basis  EKG on today's visit shows normal sinus rhythm with right bundle branch block, nonspecific ST abnormality  Other past medical history Prior hospitalization at [place] 01/14/2014 with chest pain. Cardiac catheterization showed patent grafts, saphenous vein graft to the RCA and OM, patent graft to the mid LAD. She has severe three-vessel disease. Medical management was recommended. In follow-up she was feeling weak. high fall risk,  not able to do her ADLs with assistance devices. Needing a walker and full assistance   Previous total cholesterol 178, LDL 78 Previous hemoglobin A1c greater than 8   Allergies  Allergen Reactions  . Codeine     REACTION: makes her "crazy"  . Penicillins Hives  . Tramadol     Hallucinations    Current Outpatient Prescriptions on File Prior to Visit  Medication Sig Dispense Refill  . amLODipine-benazepril (LOTREL) 5-20 MG per capsule Take 1 capsule by mouth daily. 30 capsule 11  .  aspirin 81 MG EC tablet Take 81 mg by mouth daily.      . feeding supplement, RESOURCE BREEZE, (RESOURCE BREEZE) LIQD Take 1 Container by mouth daily after supper. Better than Ensure because it has lower Vit K level.  0  . glimepiride (AMARYL) 2 MG tablet Take 2 mg by mouth daily with breakfast.   1  . guaiFENesin (MUCINEX) 600 MG 12 hr tablet Take 600 mg by mouth 2 (two) times daily as needed for cough or to loosen phlegm.    . isosorbide mononitrate (IMDUR) 30 MG 24 hr tablet Take 0.5 tablets (15 mg total) by mouth daily. 45 tablet 3  . magnesium hydroxide (MILK OF MAGNESIA) 400 MG/5ML suspension Take 15 mLs by mouth at bedtime.    . metFORMIN (GLUMETZA) 500 MG (MOD) 24 hr tablet Take 500 mg by mouth daily with breakfast.      . metoprolol succinate (TOPROL-XL) 50 MG 24 hr tablet Take 75 mg by mouth daily.   3  . nitroGLYCERIN (NITROSTAT) 0.4 MG SL tablet Place 1 tablet (0.4 mg total) under the tongue every 5 (five) minutes as needed for chest pain. 25 tablet 3  . omeprazole (PRILOSEC) 20 MG capsule Take 20 mg by mouth daily.    . rosuvastatin (CRESTOR) 20 MG tablet Take 1 tablet (20 mg total) by mouth at bedtime. 30 tablet 11  . warfarin (COUMADIN) 6 MG tablet TAKE AS DIRECTED BY ANTICOAGULATION CLINIC 100 tablet 1   No current facility-administered medications on file prior to visit.    Past Medical History  Diagnosis Date  . Coronary artery disease   . Mitral valve disease   . Hyperlipidemia   . Hypertension   . Diabetes mellitus without complication   . NSTEMI (non-ST elevated myocardial infarction) 01/15/2014    Past Surgical History  Procedure Laterality Date  . Coronary artery bypass graft    . Mitral valve replacement    . Cardiac catheterization    . Left heart catheterization with coronary angiogram N/A 01/17/2014    Procedure: LEFT HEART CATHETERIZATION WITH CORONARY ANGIOGRAM;  Surgeon: Lennette Biharihomas A Kelly, MD;  [facility];  Service: Cardiovascular;  Laterality: N/A;     Social History  reports that she has never smoked. Her smokeless tobacco use includes Snuff. She reports that she does not drink alcohol or use illicit drugs.  Family History family history includes Coronary artery disease in her other; Diabetes in her other; Heart disease in her mother; Stroke in her other.   Review of Systems  Respiratory: Negative.   Cardiovascular: Negative.   Gastrointestinal: Positive for nausea.  Musculoskeletal: Positive for gait problem.  Skin: Negative.   Neurological: Positive for weakness.  Hematological: Negative.   Psychiatric/Behavioral: Negative.   All other systems reviewed and are negative.   BP 140/62 mmHg  Pulse 87  Ht 5\' 2"  (1.575 m)  Wt 123 lb 8 oz (56.019 kg)  BMI 22.58 kg/m2  Physical Exam  Constitutional: She is oriented to person, place, and time. She appears well-developed and well-nourished.  HENT:  Head: Normocephalic.  Nose: Nose normal.  Mouth/Throat: Oropharynx is clear and moist.  Eyes: Conjunctivae are normal. Pupils are equal, round, and reactive to light.  Neck: Normal range of motion. Neck supple. No JVD present.  Cardiovascular: Normal rate, regular rhythm, S1 normal, S2 normal, normal heart sounds and intact distal pulses.  Exam reveals no gallop and no friction rub.   No murmur heard. Pulmonary/Chest: Effort normal and breath sounds normal. No respiratory distress. She has no wheezes. She has no rales. She exhibits no tenderness.  Abdominal: Soft. Bowel sounds are normal. She exhibits no distension. There is no tenderness.  Musculoskeletal: Normal range of motion. She exhibits no edema or tenderness.  Lymphadenopathy:    She has no cervical adenopathy.  Neurological: She is alert and oriented to person, place, and time. Coordination normal.  Skin: Skin is warm and dry. No rash noted. No erythema.  Psychiatric: She has a normal mood and affect. Her behavior is normal. Judgment and thought content normal.     Assessment and Plan  Nursing note and vitals reviewed.

## 2015-02-22 NOTE — Assessment & Plan Note (Signed)
Most recent lab work not available. Managed by primary care Recent 20 pound weight gain which will likely push her sugars higher

## 2015-02-22 NOTE — Assessment & Plan Note (Signed)
Valve previously evaluated April 2015. Will order repeat echocardiogram in 2017

## 2015-02-22 NOTE — Assessment & Plan Note (Signed)
Currently with no symptoms of angina. No further workup at this time. Continue current medication regimen. 

## 2015-02-22 NOTE — Assessment & Plan Note (Signed)
3 episodes of nausea in the past 8 months, sometimes with vomiting. Etiology is unclear though less likely cardiac. Recommended she try Tums, carbonation, monitor her blood pressure and heart rate when she is having an episode. Could increase omeprazole up to 20 mg twice a day if symptoms become more frequent. Could also try Reglan. Unable to exclude gallbladder disease, gastroparesis, or hiatal hernia

## 2015-02-27 ENCOUNTER — Telehealth: Payer: Self-pay | Admitting: *Deleted

## 2015-02-27 NOTE — Telephone Encounter (Signed)
°  1. Which medications need to be refilled? Metoprolol   2. Which pharmacy is medication to be sent to? [facility] in [place]   3. Do they need a 30 day or 90 day supply? 90   4. Would they like a call back once the medication has been sent to the pharmacy? No

## 2015-02-27 NOTE — Telephone Encounter (Signed)
Refill sent for metoprolol succ 50 mg take one and half tablet daily.

## 2015-04-12 ENCOUNTER — Ambulatory Visit (INDEPENDENT_AMBULATORY_CARE_PROVIDER_SITE_OTHER): Payer: Medicare Other

## 2015-04-12 DIAGNOSIS — Z9889 Other specified postprocedural states: Secondary | ICD-10-CM

## 2015-04-12 DIAGNOSIS — Z5181 Encounter for therapeutic drug level monitoring: Secondary | ICD-10-CM | POA: Diagnosis not present

## 2015-04-12 DIAGNOSIS — Z954 Presence of other heart-valve replacement: Secondary | ICD-10-CM | POA: Diagnosis not present

## 2015-04-12 DIAGNOSIS — I059 Rheumatic mitral valve disease, unspecified: Secondary | ICD-10-CM

## 2015-04-12 DIAGNOSIS — Z952 Presence of prosthetic heart valve: Secondary | ICD-10-CM

## 2015-04-12 LAB — POCT INR: INR: 4.2

## 2015-04-17 ENCOUNTER — Telehealth: Payer: Self-pay

## 2015-04-17 NOTE — Telephone Encounter (Signed)
Caretaker, Purnell Shoemaker wants Amanda [name] to know that she is trying to get home health nurse to check coumadin as it is difficult for the patient to get ready and come into the office.

## 2015-04-19 NOTE — Telephone Encounter (Signed)
Pt daughter returning our call.  Please call her back

## 2015-04-19 NOTE — Telephone Encounter (Signed)
Called spoke with pt's daughter, she states she moved pt's appt up to 04/27/15 because she will not be able to bring pt because of conflict with upcoming surgery on one of her other family members.  States her brother Casimiro NeedleMichael will be bringing pt to appt on 04/27/15.  Advised her ok to move appt up for sooner recheck.  Will see pt in office on 04/27/15.

## 2015-04-27 ENCOUNTER — Ambulatory Visit (INDEPENDENT_AMBULATORY_CARE_PROVIDER_SITE_OTHER): Payer: Medicare Other | Admitting: Pharmacist

## 2015-04-27 DIAGNOSIS — I059 Rheumatic mitral valve disease, unspecified: Secondary | ICD-10-CM

## 2015-04-27 DIAGNOSIS — Z5181 Encounter for therapeutic drug level monitoring: Secondary | ICD-10-CM

## 2015-04-27 DIAGNOSIS — Z9889 Other specified postprocedural states: Secondary | ICD-10-CM

## 2015-04-27 DIAGNOSIS — Z954 Presence of other heart-valve replacement: Secondary | ICD-10-CM | POA: Diagnosis not present

## 2015-04-27 DIAGNOSIS — Z952 Presence of prosthetic heart valve: Secondary | ICD-10-CM

## 2015-04-27 LAB — POCT INR: INR: 2.9

## 2015-05-17 ENCOUNTER — Ambulatory Visit (INDEPENDENT_AMBULATORY_CARE_PROVIDER_SITE_OTHER): Payer: Medicare Other

## 2015-05-17 DIAGNOSIS — Z5181 Encounter for therapeutic drug level monitoring: Secondary | ICD-10-CM | POA: Diagnosis not present

## 2015-05-17 DIAGNOSIS — Z9889 Other specified postprocedural states: Secondary | ICD-10-CM

## 2015-05-17 DIAGNOSIS — Z954 Presence of other heart-valve replacement: Secondary | ICD-10-CM

## 2015-05-17 DIAGNOSIS — I059 Rheumatic mitral valve disease, unspecified: Secondary | ICD-10-CM

## 2015-05-17 DIAGNOSIS — Z952 Presence of prosthetic heart valve: Secondary | ICD-10-CM

## 2015-05-17 LAB — POCT INR: INR: 2.7

## 2015-05-20 ENCOUNTER — Other Ambulatory Visit: Payer: Self-pay | Admitting: Cardiovascular Disease

## 2015-06-14 ENCOUNTER — Ambulatory Visit (INDEPENDENT_AMBULATORY_CARE_PROVIDER_SITE_OTHER): Payer: Medicare Other | Admitting: Pharmacist

## 2015-06-14 DIAGNOSIS — Z9889 Other specified postprocedural states: Secondary | ICD-10-CM | POA: Diagnosis not present

## 2015-06-14 DIAGNOSIS — Z5181 Encounter for therapeutic drug level monitoring: Secondary | ICD-10-CM

## 2015-06-14 DIAGNOSIS — Z952 Presence of prosthetic heart valve: Secondary | ICD-10-CM

## 2015-06-14 DIAGNOSIS — Z954 Presence of other heart-valve replacement: Secondary | ICD-10-CM

## 2015-06-14 DIAGNOSIS — I059 Rheumatic mitral valve disease, unspecified: Secondary | ICD-10-CM | POA: Diagnosis not present

## 2015-06-14 LAB — POCT INR: INR: 2.5

## 2015-08-02 ENCOUNTER — Ambulatory Visit (INDEPENDENT_AMBULATORY_CARE_PROVIDER_SITE_OTHER): Payer: Medicare Other

## 2015-08-02 DIAGNOSIS — I059 Rheumatic mitral valve disease, unspecified: Secondary | ICD-10-CM | POA: Diagnosis not present

## 2015-08-02 DIAGNOSIS — Z954 Presence of other heart-valve replacement: Secondary | ICD-10-CM | POA: Diagnosis not present

## 2015-08-02 DIAGNOSIS — Z5181 Encounter for therapeutic drug level monitoring: Secondary | ICD-10-CM

## 2015-08-02 DIAGNOSIS — Z9889 Other specified postprocedural states: Secondary | ICD-10-CM

## 2015-08-02 DIAGNOSIS — Z952 Presence of prosthetic heart valve: Secondary | ICD-10-CM

## 2015-08-02 LAB — POCT INR: INR: 3.2

## 2015-08-11 ENCOUNTER — Emergency Department: Payer: Medicare Other

## 2015-08-11 ENCOUNTER — Emergency Department
Admission: EM | Admit: 2015-08-11 | Discharge: 2015-08-11 | Disposition: A | Discharge status: Home / Self Care | Payer: Medicare Other | Attending: Emergency Medicine | Admitting: Emergency Medicine

## 2015-08-11 ENCOUNTER — Encounter: Payer: Self-pay | Admitting: Emergency Medicine

## 2015-08-11 DIAGNOSIS — Z7901 Long term (current) use of anticoagulants: Secondary | ICD-10-CM | POA: Insufficient documentation

## 2015-08-11 DIAGNOSIS — E119 Type 2 diabetes mellitus without complications: Secondary | ICD-10-CM | POA: Diagnosis not present

## 2015-08-11 DIAGNOSIS — I10 Essential (primary) hypertension: Secondary | ICD-10-CM | POA: Insufficient documentation

## 2015-08-11 DIAGNOSIS — K529 Noninfective gastroenteritis and colitis, unspecified: Secondary | ICD-10-CM

## 2015-08-11 DIAGNOSIS — Z7982 Long term (current) use of aspirin: Secondary | ICD-10-CM | POA: Diagnosis not present

## 2015-08-11 DIAGNOSIS — Z79899 Other long term (current) drug therapy: Secondary | ICD-10-CM | POA: Insufficient documentation

## 2015-08-11 DIAGNOSIS — Z880 Allergy status to penicillin: Secondary | ICD-10-CM | POA: Insufficient documentation

## 2015-08-11 DIAGNOSIS — R1030 Lower abdominal pain, unspecified: Secondary | ICD-10-CM | POA: Diagnosis present

## 2015-08-11 LAB — PROTIME-INR
INR: 2.3
Prothrombin Time: 25.4 seconds — ABNORMAL HIGH (ref 11.4–15.0)

## 2015-08-11 LAB — COMPREHENSIVE METABOLIC PANEL
ALBUMIN: 4.1 g/dL (ref 3.5–5.0)
ALK PHOS: 72 U/L (ref 38–126)
ALT: 16 U/L (ref 14–54)
ANION GAP: 6 (ref 5–15)
AST: 26 U/L (ref 15–41)
BILIRUBIN TOTAL: 0.5 mg/dL (ref 0.3–1.2)
BUN: 20 mg/dL (ref 6–20)
CALCIUM: 9.7 mg/dL (ref 8.9–10.3)
CO2: 30 mmol/L (ref 22–32)
CREATININE: 0.98 mg/dL (ref 0.44–1.00)
Chloride: 101 mmol/L (ref 101–111)
GFR calc non Af Amer: 50 mL/min — ABNORMAL LOW (ref >60)
GFR, EST AFRICAN AMERICAN: 58 mL/min — AB (ref >60)
GLUCOSE: 182 mg/dL — AB (ref 65–99)
Potassium: 4 mmol/L (ref 3.5–5.1)
Sodium: 137 mmol/L (ref 135–145)
TOTAL PROTEIN: 7.2 g/dL (ref 6.5–8.1)

## 2015-08-11 LAB — CBC WITH DIFFERENTIAL/PLATELET
Basophils Absolute: 0 10*3/uL (ref 0–0.1)
Basophils Relative: 1 %
Eosinophils Absolute: 0.2 10*3/uL (ref 0–0.7)
Eosinophils Relative: 4 %
HEMATOCRIT: 34.7 % — AB (ref 35.0–47.0)
HEMOGLOBIN: 11 g/dL — AB (ref 12.0–16.0)
LYMPHS ABS: 1.2 10*3/uL (ref 1.0–3.6)
Lymphocytes Relative: 18 %
MCH: 26.6 pg (ref 26.0–34.0)
MCHC: 31.6 g/dL — AB (ref 32.0–36.0)
MCV: 84.3 fL (ref 80.0–100.0)
MONOS PCT: 9 %
Monocytes Absolute: 0.6 10*3/uL (ref 0.2–0.9)
NEUTROS ABS: 4.3 10*3/uL (ref 1.4–6.5)
NEUTROS PCT: 68 %
Platelets: 261 10*3/uL (ref 150–440)
RBC: 4.12 MIL/uL (ref 3.80–5.20)
RDW: 15.3 % — ABNORMAL HIGH (ref 11.5–14.5)
WBC: 6.3 10*3/uL (ref 3.6–11.0)

## 2015-08-11 LAB — LACTIC ACID, PLASMA: Lactic Acid, Venous: 1.4 mmol/L (ref 0.5–2.0)

## 2015-08-11 LAB — LIPASE, BLOOD: Lipase: 30 U/L (ref 11–51)

## 2015-08-11 MED ORDER — SODIUM CHLORIDE 0.9 % IV BOLUS (SEPSIS)
500.0000 mL | Freq: Once | INTRAVENOUS | Status: AC
  Administered 2015-08-11: 500 mL via INTRAVENOUS

## 2015-08-11 MED ORDER — IOHEXOL 350 MG/ML SOLN
100.0000 mL | Freq: Once | INTRAVENOUS | Status: AC | PRN
  Administered 2015-08-11: 100 mL via INTRAVENOUS

## 2015-08-11 NOTE — ED Notes (Signed)
Pt is currently taking coumadin.

## 2015-08-11 NOTE — Discharge Instructions (Signed)
Colitis Colitis is inflammation of the colon. Colitis may last a short time (acute) or it may last a long time (chronic). CAUSES This condition may be caused by:  Viruses.  Bacteria.  Reactions to medicine.  Certain autoimmune diseases, such as Crohn disease or ulcerative colitis. SYMPTOMS Symptoms of this condition include:  Diarrhea.  Passing bloody or tarry stool.  Pain.  Fever.  Vomiting.  Tiredness (fatigue).  Weight loss.  Bloating.  Sudden increase in abdominal pain.  Having fewer bowel movements than usual. DIAGNOSIS This condition is diagnosed with a stool test or a blood test. You may also have other tests, including X-rays, a CT scan, or a colonoscopy. TREATMENT Treatment may include:  Resting the bowel. This involves not eating or drinking for a period of time.  Fluids that are given through an IV tube.  Medicine for pain and diarrhea.  Antibiotic medicines.  Cortisone medicines.  Surgery. HOME CARE INSTRUCTIONS Eating and Drinking  Follow instructions from your health care provider about eating or drinking restrictions.  Drink enough fluid to keep your urine clear or pale yellow.  Work with a dietitian to determine which foods cause your condition to flare up.  Avoid foods that cause flare-ups.  Eat a well-balanced diet. Medicines  Take over-the-counter and prescription medicines only as told by your health care provider.  If you were prescribed an antibiotic medicine, take it as told by your health care provider. Do not stop taking the antibiotic even if you start to feel better. General Instructions  Keep all follow-up visits as told by your health care provider. This is important. SEEK MEDICAL CARE IF:  Your symptoms do not go away.  You develop new symptoms. SEEK IMMEDIATE MEDICAL CARE IF:  You have a fever that does not go away with treatment.  You develop chills.  You have extreme weakness, fainting, or  dehydration.  You have repeated vomiting.  You develop severe pain in your abdomen.  You pass additional bloody or tarry stool.   This information is not intended to replace advice given to you by your health care provider. Make sure you discuss any questions you have with your health care provider.   Document Released: 11/14/2004 Document Revised: 06/28/2015 Document Reviewed: 01/30/2015 Elsevier Interactive Patient Education [employer]2016 [employer]

## 2015-08-11 NOTE — ED Provider Notes (Signed)
[facility] Emergency Department Provider Note REMINDER - THIS NOTE IS NOT A FINAL MEDICAL RECORD UNTIL IT IS SIGNED. UNTIL THEN, THE CONTENT BELOW MAY REFLECT INFORMATION FROM A DOCUMENTATION TEMPLATE, NOT THE ACTUAL PATIENT VISIT. ____________________________________________  Time seen: Approximately 11:19 AM  I have reviewed the triage vital signs and the nursing notes.   HISTORY  Chief Complaint Rectal Bleeding and Abdominal Pain    HPI Amanda [name] is a 81 y.o. female history of mitral valve replacement, on Coumadin,as well as coronary disease, hypertension.  Patient notes that since about midnight she is been having occasional cramps across the lower abdomen, which will last for several minutes and then go away after having a bowel movement. She reports that after each bowel movement she is had about a teaspoon full of blood in her stool, having had about 5 or 6 episodes of crampy lower abdominal pain with loose brown but occasionally bloody stool.  No fevers or chills. No trouble urinating. No chest pain or trouble breathing. No nausea or vomiting. She had a colonoscopy which she reports having done in the 1960s, for which there is coarse no record.  She does not have any history of abdominal bleeding or blood in her stool in the past. She presents today with her nephew as well who is her healthcare power of attorney.   Past Medical History  Diagnosis Date  . Coronary artery disease   . Mitral valve disease   . Hyperlipidemia   . Hypertension   . Diabetes mellitus without complication (HCC)   . NSTEMI (non-ST elevated myocardial infarction) (HCC) 01/15/2014    Patient Active Problem List   Diagnosis Date Noted  . Nausea 02/22/2015  . Mitral valve disorder 02/14/2014  . Encounter for therapeutic drug monitoring 02/14/2014  . Anemia 01/21/2014  . Protein-calorie malnutrition, severe (HCC) 01/19/2014  . NSTEMI (non-ST elevated myocardial  infarction) (HCC) 01/15/2014  . Diabetes mellitus (HCC) 05/29/2012  . Hypertension 05/29/2012  . Hyperlipidemia 04/05/2010  . CAD, ARTERY BYPASS GRAFT, 2004, LIMA-LAD; VG-RCA; VG-LCX 04/05/2010  . MITRAL VALVE REPLACEMENT, HX OF, 2004, 27 mmmechanical valve 12/27/2009    Past Surgical History  Procedure Laterality Date  . Coronary artery bypass graft    . Mitral valve replacement    . Cardiac catheterization    . Left heart catheterization with coronary angiogram N/A 01/17/2014    Procedure: LEFT HEART CATHETERIZATION WITH CORONARY ANGIOGRAM;  Surgeon: Lennette Biharihomas A Kelly, MD;  [facility];  Service: Cardiovascular;  Laterality: N/A;    Current Outpatient Rx  Name  Route  Sig  Dispense  Refill  . amLODipine (NORVASC) 5 MG tablet   Oral   Take 5 mg by mouth daily.       4   . aspirin 81 MG EC tablet   Oral   Take 81 mg by mouth daily.           . benazepril (LOTENSIN) 20 MG tablet   Oral   Take 20 mg by mouth daily.       3   . Calcium-Vitamin D-Vitamin K ([employer] CALCIUM CHEWS PO)   Oral   Take 1 tablet by mouth daily at 12 noon.         . Cholecalciferol (D-3-5) 5000 UNITS capsule   Oral   Take 5,000 Units by mouth daily.         . feeding supplement, RESOURCE BREEZE, (RESOURCE BREEZE) LIQD   Oral   Take 1 Container  by mouth daily after supper. Better than Ensure because it has lower Vit K level.      0   . glimepiride (AMARYL) 2 MG tablet   Oral   Take 2 mg by mouth daily with breakfast.       1   . guaiFENesin (MUCINEX) 600 MG 12 hr tablet   Oral   Take 600 mg by mouth 2 (two) times daily as needed for cough or to loosen phlegm.         . isosorbide mononitrate (IMDUR) 30 MG 24 hr tablet      TAKE 1/2 TABLET BY MOUTH DAILY   45 tablet   6   . ketorolac (TORADOL) 10 MG tablet   Oral   Take 10 mg by mouth every 6 (six) hours as needed.         Marland Kitchen levothyroxine (SYNTHROID, LEVOTHROID) 25 MCG tablet   Oral   Take 25 mcg by mouth daily  before breakfast.         . loratadine (CLARITIN) 10 MG tablet   Oral   Take 10 mg by mouth daily.         . magnesium hydroxide (MILK OF MAGNESIA) 400 MG/5ML suspension   Oral   Take 15 mLs by mouth at bedtime.         . metFORMIN (GLUMETZA) 500 MG (MOD) 24 hr tablet   Oral   Take 500 mg by mouth daily with breakfast.           . metoprolol succinate (TOPROL-XL) 50 MG 24 hr tablet      Take one and half tablet daily. Take with or immediately following a meal.   135 tablet   3   . nitroGLYCERIN (NITROSTAT) 0.4 MG SL tablet   Sublingual   Place 1 tablet (0.4 mg total) under the tongue every 5 (five) minutes as needed for chest pain.   25 tablet   3   . omeprazole (PRILOSEC) 20 MG capsule   Oral   Take 20 mg by mouth daily.         . ondansetron (ZOFRAN) 4 MG tablet   Oral   Take 1 tablet (4 mg total) by mouth every 8 (eight) hours as needed for nausea or vomiting.   10 tablet   6   . rosuvastatin (CRESTOR) 20 MG tablet   Oral   Take 1 tablet (20 mg total) by mouth at bedtime.   30 tablet   11   . warfarin (COUMADIN) 6 MG tablet      TAKE AS DIRECTED BY ANTICOAGULATION CLINIC Patient taking differently: PT take  on 0 tablets is a 90 day supply     Allergies Codeine; Penicillins; and Tramadol  Family History  Problem Relation Age of Onset  . Coronary artery disease Other   . Diabetes Other   . Stroke Other   . Heart disease Mother     Social History Social History  Substance Use Topics  . Smoking status: Never Smoker   . Smokeless tobacco: Current User    Types: Snuff  . Alcohol Use: No    Review of Systems Constitutional: No fever/chills. Eyes: No visual changes. ENT: No sore throat. Cardiovascular: Denies chest pain. Respiratory: Denies shortness of breath. Gastrointestinal: See history of present illness.  No  constipation. Genitourinary: Negative for dysuria.  Musculoskeletal: Negative for back pain. Skin: Negative for rash. Neurological: Negative for headaches, focal weakness or numbness.  10-point ROS otherwise negative.  ____________________________________________   PHYSICAL EXAM:  VITAL SIGNS: ED Triage Vitals  Enc Vitals Group     BP 08/11/15 0950 170/58 mmHg     Pulse Rate 08/11/15 0950 97     Resp 08/11/15 0950 16     Temp 08/11/15 0950 98 F (36.7 C)     Temp Source 08/11/15 0950 Oral     SpO2 08/11/15 0950 97 %     Weight 08/11/15 0950 128 lb (58.06 kg)     Height 08/11/15 0950 5\' 4"  (1.626 m)     Head Cir --      Peak Flow --      Pain Score 08/11/15 1004 10     Pain Loc --      Pain Edu? --      Excl. in GC? --    Constitutional: Alert and oriented. Well appearing and in no acute distress. Eyes: Conjunctivae are normal. PERRL. EOMI. Head: Atraumatic. Nose: No congestion/rhinnorhea. Mouth/Throat: Mucous membranes are moist.  Oropharynx non-erythematous. Neck: No stridor.   Cardiovascular: Normal rate, regular rhythm. Grossly normal heart sounds to for a slightly prominent S2.  Good peripheral circulation. Respiratory: Normal respiratory effort.  No retractions. Lungs CTAB. Gastrointestinal: Soft and nontender. No distention. No abdominal bruits. No CVA tenderness. No rebound or guarding. Escorted by RN, rectal exam negative for stool in the vault. Hemoccult negative, but again no stool is collected. No gross blood in the rectum. Musculoskeletal: No lower extremity tenderness nor edema.  No joint effusions. Neurologic:  Normal speech and language. No gross focal neurologic deficits are appreciated. Skin:  Skin is warm, dry and intact. No rash noted. Psychiatric: Mood and affect are normal. Speech and behavior are normal.  ____________________________________________   LABS (all labs ordered are listed, but only abnormal results are displayed)  Labs Reviewed   CBC WITH DIFFERENTIAL/PLATELET - Abnormal; Notable for the following:    Hemoglobin 11.0 (*)    HCT 34.7 (*)    MCHC 31.6 (*)    RDW 15.3 (*)    All other components within normal limits  COMPREHENSIVE METABOLIC PANEL - Abnormal; Notable for the following:    Glucose, Bld 182 (*)    GFR calc non Af Amer 50 (*)    GFR calc Af Amer 58 (*)    All other components within normal limits  PROTIME-INR - Abnormal; Notable for the following:    Prothrombin Time 25.4 (*)    All other components within normal limits  LIPASE, BLOOD  LACTIC ACID, PLASMA  TYPE AND SCREEN   ____________________________________________  EKG  ED ECG REPORT I, QUALE, MARK, the attending physician, personally viewed and interpreted this ECG.  Date: 08/11/2015 EKG Time: 10:30 Rate: 90 Rhythm: normal sinus rhythm QRS Axis: normal Intervals: normal ST/T Wave abnormalities: normal Conduction Disutrbances: Right bundle-branch block Narrative Interpretation: unremarkable except for right bundle-branch block, no ischemic abnormality noted  Notably, the patient is not in atrial fibrillation.  ____________________________________________  RADIOLOGY  IMPRESSION: Atherosclerotic disease throughout the aorta and at the origin of the mesenteric vessels. There is moderate stenosis of the celiac artery. Superior mesenteric artery is widely patent. Unable to assess the origin of the inferior mesenteric artery due to calcified plaque and the small size of the vessel, but the vessel is patent.  No evidence of abnormal wall thickening throughout the colon. There are scattered  diverticula, most pronounced in the left colon. No active diverticulitis.  No acute findings in the abdomen or pelvis. ____________________________________________   PROCEDURES  Procedure(s) performed: None  Critical Care performed: No  ____________________________________________   INITIAL IMPRESSION / ASSESSMENT AND PLAN / ED  COURSE  Pertinent labs & imaging results that were available during my care of the patient were reviewed by me and considered in my medical decision making (see chart for details).  Patient presents with intermittent crampy lower abdominal pain and reported blood in stool since last night. She is very stable, hemoglobin is 11. She has no systemic symptoms, no evidence of infection by clinical exam. Her symptoms are largely resolved at this time, but in the setting of intermittent crampy abdominal pain with blood in the stool consideration for ischemic colitis is made, diverticulitis, AVM, or other etiology of bleeding. I do not believe this represents stomach ulcer, as the patient does not have any upper abdominal pain symptoms and reports that she saw red blood in her stool. We will obtain CT imaging, and continue to monitor her closely in the emergency room.  ----------------------------------------- 2:13 PM on 08/11/2015 -----------------------------------------  Patient not have any further bloody bowel movements or abdominal pain in the ER. Continue to await read on her CT angiography.  ----------------------------------------- 3:49 PM on 08/11/2015 -----------------------------------------  Patient asymptomatic, no further stooling or bleeding. Denies any concerns. Reviewed her CT scan with her and we did discuss that this could be from low blood flow, could still be from infection, or be from other causes such as bleeding or even something such as a tumor. The patient understands, she wishes to be able to be discharge and I think this is very reasonable. I'll discharge her home and have advised follow-up with her primary care doctor early next week. Patient and nephew both agreeable. ____________________________________________   FINAL CLINICAL IMPRESSION(S) / ED DIAGNOSES  Final diagnoses:  Colitis      Sharyn Creamer, MD 08/11/15 1550

## 2015-08-11 NOTE — ED Notes (Signed)
Per pt last ate at 1230

## 2015-08-11 NOTE — ED Notes (Signed)
Pt presents with abd pain and rectal bleeding started last night. Has had multiple episodes of diarrhea and rectal bleeding with some weakness. No hx of rectal bleeding.

## 2015-08-14 ENCOUNTER — Encounter: Payer: Self-pay | Admitting: Cardiovascular Disease

## 2015-08-14 ENCOUNTER — Ambulatory Visit (INDEPENDENT_AMBULATORY_CARE_PROVIDER_SITE_OTHER): Payer: Medicare Other | Admitting: Cardiovascular Disease

## 2015-08-14 VITALS — BP 122/56 | HR 85 | Ht 64.0 in | Wt 123.5 lb

## 2015-08-14 DIAGNOSIS — R109 Unspecified abdominal pain: Secondary | ICD-10-CM | POA: Insufficient documentation

## 2015-08-14 DIAGNOSIS — R1030 Lower abdominal pain, unspecified: Secondary | ICD-10-CM

## 2015-08-14 DIAGNOSIS — I10 Essential (primary) hypertension: Secondary | ICD-10-CM | POA: Diagnosis not present

## 2015-08-14 DIAGNOSIS — Z23 Encounter for immunization: Secondary | ICD-10-CM

## 2015-08-14 DIAGNOSIS — Z9889 Other specified postprocedural states: Secondary | ICD-10-CM

## 2015-08-14 DIAGNOSIS — I214 Non-ST elevation (NSTEMI) myocardial infarction: Secondary | ICD-10-CM

## 2015-08-14 DIAGNOSIS — I25708 Atherosclerosis of coronary artery bypass graft(s), unspecified, with other forms of angina pectoris: Secondary | ICD-10-CM

## 2015-08-14 DIAGNOSIS — E785 Hyperlipidemia, unspecified: Secondary | ICD-10-CM

## 2015-08-14 MED ORDER — EZETIMIBE 10 MG PO TABS: 10.0000 mg | ORAL_TABLET | Freq: Every day | ORAL | Status: DC

## 2015-08-14 MED ORDER — ROSUVASTATIN CALCIUM 40 MG PO TABS: 40.0000 mg | ORAL_TABLET | Freq: Every day | ORAL | Status: DC

## 2015-08-14 NOTE — Assessment & Plan Note (Signed)
Blood pressure is well controlled on today's visit. No changes made to the medications. 

## 2015-08-14 NOTE — Progress Notes (Signed)
Patient ID: Amanda [name], female    DOB: 12/15/1924, 81 y.o.   MRN: [id number]  HPI Comments: 61 -year-old woman with a past medical history of coronary artery disease, bypass surgery in 2004 at [facility] with a mechanical mitral valve on warfarin, with history of hypertension, hyperlipidemia who presents for routine followup of her coronary artery disease. Recent trip to the emergency room last week for severe lower abdominal pain. CTA of the abdomen showed atherosclerosis of aorta, celiac vessels Prior history of nausea  In follow up today, she denies any further episodes of abdominal discomfort It was associated with 2 bowel movements, now feels well. There was blood with her bowel movement No diverticulitis on CT scan. This was reviewed with her She does have a history of nausea  In general she feels well. Lab work shows total cholesterol 214, LDL 111, hemoglobin A1c 8.4. She has been drinking milkshakes Low vitamin D  Lab work reviewed from the hospital EKG shows normal sinus rhythm with rate 92 bpm, right bundle branch block. This was from 08/11/2015  Other past medical history Prior hospitalization at [facility] 01/14/2014 with chest pain. Cardiac catheterization showed patent grafts, saphenous vein graft to the RCA and OM, patent graft to the mid LAD. She has severe three-vessel disease. Medical management was recommended. In follow-up she was feeling weak. high fall risk,  not able to do her ADLs with assistance devices. Needing a walker and full assistance   Previous total cholesterol 178, LDL 78 Previous hemoglobin A1c greater than 8   Allergies  Allergen Reactions  . Codeine     REACTION: makes her "crazy"  . Penicillins Hives  . Tramadol     Hallucinations    Current Outpatient Prescriptions on File Prior to Visit  Medication Sig Dispense Refill  . amLODipine (NORVASC) 5 MG tablet Take 5 mg by mouth daily.   4  . aspirin 81 MG EC tablet Take 81 mg  by mouth daily.      . benazepril (LOTENSIN) 20 MG tablet Take 20 mg by mouth daily.   3  . Calcium-Vitamin D-Vitamin K ([employer] CALCIUM CHEWS PO) Take 1 tablet by mouth daily at 12 noon.    . Cholecalciferol (D-3-5) 5000 UNITS capsule Take 5,000 Units by mouth daily.    Marland Kitchen glimepiride (AMARYL) 2 MG tablet Take 2 mg by mouth daily with breakfast.   1  . guaiFENesin (MUCINEX) 600 MG 12 hr tablet Take 600 mg by mouth 2 (two) times daily as needed for cough or to loosen phlegm.    . isosorbide mononitrate (IMDUR) 30 MG 24 hr tablet TAKE 1/2 TABLET BY MOUTH DAILY 45 tablet 6  . ketorolac (TORADOL) 10 MG tablet Take 10 mg by mouth every 6 (six) hours as needed.    Marland Kitchen levothyroxine (SYNTHROID, LEVOTHROID) 25 MCG tablet Take 25 mcg by mouth daily before breakfast.    . loratadine (CLARITIN) 10 MG tablet Take 10 mg by mouth daily.    . magnesium hydroxide (MILK OF MAGNESIA) 400 MG/5ML suspension Take 15 mLs by mouth at bedtime.    . metFORMIN (GLUMETZA) 500 MG (MOD) 24 hr tablet Take 500 mg by mouth daily with breakfast.      . metoprolol succinate (TOPROL-XL) 50 MG 24 hr tablet Take one and half tablet daily. Take with or immediately following a meal. 135 tablet 3  . nitroGLYCERIN (NITROSTAT) 0.4 MG SL tablet Place 1 tablet (0.4 mg total) under the tongue every 5 (  five) minutes as needed for chest pain. 25 tablet 3  . omeprazole (PRILOSEC) 20 MG capsule Take 20 mg by mouth daily.    . ondansetron (ZOFRAN) 4 MG tablet Take 1 tablet (4 mg total) by mouth every 8 (eight) hours as needed for nausea or vomiting. 10 tablet 6  . warfarin (COUMADIN) 6 MG tablet TAKE AS DIRECTED BY ANTICOAGULATION CLINIC (Patient taking differently: PT take 6mg  on Sunday, Tuesday, Wednesday, Thursday, & Saturday. Take 3mg  on Monday & Friday.) 100 tablet 1   No current facility-administered medications on file prior to visit.    Past Medical History  Diagnosis Date  . Coronary artery disease   . Mitral valve disease   .  Hyperlipidemia   . Hypertension   . Diabetes mellitus without complication (HCC)   . NSTEMI (non-ST elevated myocardial infarction) (HCC) 01/15/2014    Past Surgical History  Procedure Laterality Date  . Coronary artery bypass graft    . Mitral valve replacement    . Cardiac catheterization    . Left heart catheterization with coronary angiogram N/A 01/17/2014    Procedure: LEFT HEART CATHETERIZATION WITH CORONARY ANGIOGRAM;  Surgeon: Lennette Biharihomas A Kelly, MD;  [facility];  Service: Cardiovascular;  Laterality: N/A;    Social History  reports that she has never smoked. Her smokeless tobacco use includes Snuff. She reports that she does not drink alcohol or use illicit drugs.  Family History family history includes Coronary artery disease in her other; Diabetes in her other; Heart disease in her mother; Stroke in her other.   Review of Systems  Respiratory: Negative.   Cardiovascular: Negative.   Gastrointestinal: Positive for abdominal pain.  Musculoskeletal: Positive for gait problem.  Skin: Negative.   Neurological: Positive for weakness.  Hematological: Negative.   Psychiatric/Behavioral: Negative.   All other systems reviewed and are negative.   BP 122/56 mmHg  Pulse 85  Ht 5\' 4"  (1.626 m)  Wt 123 lb 8 oz (56.019 kg)  BMI 21.19 kg/m2  Physical Exam  Constitutional: She is oriented to person, place, and time. She appears well-developed and well-nourished.  HENT:  Head: Normocephalic.  Nose: Nose normal.  Mouth/Throat: Oropharynx is clear and moist.  Eyes: Conjunctivae are normal. Pupils are equal, round, and reactive to light.  Neck: Normal range of motion. Neck supple. No JVD present.  Cardiovascular: Normal rate, regular rhythm, S1 normal, S2 normal, normal heart sounds and intact distal pulses.  Exam reveals no gallop and no friction rub.   No murmur heard. Pulmonary/Chest: Effort normal and breath sounds normal. No respiratory distress. She has no wheezes.  She has no rales. She exhibits no tenderness.  Abdominal: Soft. Bowel sounds are normal. She exhibits no distension. There is no tenderness.  Musculoskeletal: Normal range of motion. She exhibits no edema or tenderness.  Lymphadenopathy:    She has no cervical adenopathy.  Neurological: She is alert and oriented to person, place, and time. Coordination normal.  Skin: Skin is warm and dry. No rash noted. No erythema.  Psychiatric: She has a normal mood and affect. Her behavior is normal. Judgment and thought content normal.    Assessment and Plan  Nursing note and vitals reviewed.

## 2015-08-14 NOTE — Assessment & Plan Note (Signed)
Etiology of her pain is unclear. Possibly from constipation. Recommended she start Mira lax on a regular basis, possibly with Citrucel Of note is the celiac artery stenosis She does have a prior history of nausea. She denies any other recent symptoms If she continues to have issues, could consider ischemic workup, ultrasound of her mesenteric vessels

## 2015-08-14 NOTE — Assessment & Plan Note (Signed)
Currently with no symptoms of angina. Talked about improving her cholesterol numbers

## 2015-08-14 NOTE — Assessment & Plan Note (Signed)
Recommended repeat ultrasound next year

## 2015-08-14 NOTE — Patient Instructions (Addendum)
You are doing well.  We will give you a flu shot today  Please increase the crestor up to 40 mg daily Check the price of the zetia, one a day  Please call us if you have new issues that need to be addressed before your next appt.  Your physician wants you to follow-up in: 6 months.  You will receive a reminder letter in the mail two months in advance. If you don't receive a letter, please call our office to schedule the follow-up appointment.

## 2015-08-14 NOTE — Assessment & Plan Note (Signed)
Cholesterol medication changes as above. Currently with no symptoms of angina No further testing

## 2015-08-14 NOTE — Assessment & Plan Note (Signed)
Suggested she increase her Crestor up to 40 mg daily. Prescription sent in We will add zetia 10 mg daily when this goes generic. Prescription sent in today

## 2015-09-06 ENCOUNTER — Ambulatory Visit (INDEPENDENT_AMBULATORY_CARE_PROVIDER_SITE_OTHER): Payer: Medicare Other

## 2015-09-06 DIAGNOSIS — Z5181 Encounter for therapeutic drug level monitoring: Secondary | ICD-10-CM

## 2015-09-06 DIAGNOSIS — I059 Rheumatic mitral valve disease, unspecified: Secondary | ICD-10-CM

## 2015-09-06 DIAGNOSIS — Z952 Presence of prosthetic heart valve: Secondary | ICD-10-CM

## 2015-09-06 DIAGNOSIS — Z9889 Other specified postprocedural states: Secondary | ICD-10-CM

## 2015-09-06 DIAGNOSIS — Z954 Presence of other heart-valve replacement: Secondary | ICD-10-CM

## 2015-09-06 LAB — POCT INR: INR: 5

## 2015-09-20 ENCOUNTER — Ambulatory Visit (INDEPENDENT_AMBULATORY_CARE_PROVIDER_SITE_OTHER): Payer: Medicare Other | Admitting: *Deleted

## 2015-09-20 DIAGNOSIS — Z952 Presence of prosthetic heart valve: Secondary | ICD-10-CM

## 2015-09-20 DIAGNOSIS — Z5181 Encounter for therapeutic drug level monitoring: Secondary | ICD-10-CM

## 2015-09-20 DIAGNOSIS — I059 Rheumatic mitral valve disease, unspecified: Secondary | ICD-10-CM | POA: Diagnosis not present

## 2015-09-20 DIAGNOSIS — Z954 Presence of other heart-valve replacement: Secondary | ICD-10-CM

## 2015-09-20 DIAGNOSIS — Z9889 Other specified postprocedural states: Secondary | ICD-10-CM

## 2015-09-20 LAB — POCT INR: INR: 4.2

## 2015-10-04 ENCOUNTER — Ambulatory Visit (INDEPENDENT_AMBULATORY_CARE_PROVIDER_SITE_OTHER): Payer: Medicare Other

## 2015-10-04 DIAGNOSIS — I059 Rheumatic mitral valve disease, unspecified: Secondary | ICD-10-CM | POA: Diagnosis not present

## 2015-10-04 DIAGNOSIS — Z954 Presence of other heart-valve replacement: Secondary | ICD-10-CM | POA: Diagnosis not present

## 2015-10-04 DIAGNOSIS — Z952 Presence of prosthetic heart valve: Secondary | ICD-10-CM

## 2015-10-04 DIAGNOSIS — Z5181 Encounter for therapeutic drug level monitoring: Secondary | ICD-10-CM

## 2015-10-04 DIAGNOSIS — Z9889 Other specified postprocedural states: Secondary | ICD-10-CM

## 2015-10-04 LAB — POCT INR: INR: 2.5

## 2015-10-25 ENCOUNTER — Ambulatory Visit (INDEPENDENT_AMBULATORY_CARE_PROVIDER_SITE_OTHER): Payer: Medicare Other

## 2015-10-25 DIAGNOSIS — Z5181 Encounter for therapeutic drug level monitoring: Secondary | ICD-10-CM

## 2015-10-25 DIAGNOSIS — Z952 Presence of prosthetic heart valve: Secondary | ICD-10-CM

## 2015-10-25 DIAGNOSIS — Z954 Presence of other heart-valve replacement: Secondary | ICD-10-CM | POA: Diagnosis not present

## 2015-10-25 DIAGNOSIS — I059 Rheumatic mitral valve disease, unspecified: Secondary | ICD-10-CM | POA: Diagnosis not present

## 2015-10-25 DIAGNOSIS — Z9889 Other specified postprocedural states: Secondary | ICD-10-CM | POA: Diagnosis not present

## 2015-10-25 LAB — POCT INR: INR: 6.6

## 2015-11-03 ENCOUNTER — Ambulatory Visit (INDEPENDENT_AMBULATORY_CARE_PROVIDER_SITE_OTHER): Payer: Medicare Other | Admitting: Internal Medicine

## 2015-11-03 DIAGNOSIS — Z5181 Encounter for therapeutic drug level monitoring: Secondary | ICD-10-CM

## 2015-11-03 DIAGNOSIS — I059 Rheumatic mitral valve disease, unspecified: Secondary | ICD-10-CM

## 2015-11-03 DIAGNOSIS — Z9889 Other specified postprocedural states: Secondary | ICD-10-CM

## 2015-11-03 LAB — POCT INR: INR: 1.8

## 2015-11-08 ENCOUNTER — Ambulatory Visit (INDEPENDENT_AMBULATORY_CARE_PROVIDER_SITE_OTHER): Payer: Medicare Other | Admitting: *Deleted

## 2015-11-08 DIAGNOSIS — Z954 Presence of other heart-valve replacement: Secondary | ICD-10-CM | POA: Diagnosis not present

## 2015-11-08 DIAGNOSIS — Z952 Presence of prosthetic heart valve: Secondary | ICD-10-CM

## 2015-11-08 DIAGNOSIS — I059 Rheumatic mitral valve disease, unspecified: Secondary | ICD-10-CM | POA: Diagnosis not present

## 2015-11-08 DIAGNOSIS — Z5181 Encounter for therapeutic drug level monitoring: Secondary | ICD-10-CM

## 2015-11-08 DIAGNOSIS — Z9889 Other specified postprocedural states: Secondary | ICD-10-CM | POA: Diagnosis not present

## 2015-11-08 LAB — POCT INR: INR: 3.3

## 2015-11-22 ENCOUNTER — Ambulatory Visit (INDEPENDENT_AMBULATORY_CARE_PROVIDER_SITE_OTHER): Payer: Medicare Other

## 2015-11-22 DIAGNOSIS — Z5181 Encounter for therapeutic drug level monitoring: Secondary | ICD-10-CM | POA: Diagnosis not present

## 2015-11-22 DIAGNOSIS — Z9889 Other specified postprocedural states: Secondary | ICD-10-CM

## 2015-11-22 DIAGNOSIS — Z952 Presence of prosthetic heart valve: Secondary | ICD-10-CM

## 2015-11-22 DIAGNOSIS — Z954 Presence of other heart-valve replacement: Secondary | ICD-10-CM | POA: Diagnosis not present

## 2015-11-22 DIAGNOSIS — I059 Rheumatic mitral valve disease, unspecified: Secondary | ICD-10-CM | POA: Diagnosis not present

## 2015-11-22 LAB — POCT INR: INR: 4

## 2015-12-06 ENCOUNTER — Ambulatory Visit (INDEPENDENT_AMBULATORY_CARE_PROVIDER_SITE_OTHER): Payer: Medicare Other

## 2015-12-06 DIAGNOSIS — Z952 Presence of prosthetic heart valve: Secondary | ICD-10-CM

## 2015-12-06 DIAGNOSIS — Z5181 Encounter for therapeutic drug level monitoring: Secondary | ICD-10-CM | POA: Diagnosis not present

## 2015-12-06 DIAGNOSIS — Z9889 Other specified postprocedural states: Secondary | ICD-10-CM

## 2015-12-06 DIAGNOSIS — Z954 Presence of other heart-valve replacement: Secondary | ICD-10-CM | POA: Diagnosis not present

## 2015-12-06 DIAGNOSIS — I059 Rheumatic mitral valve disease, unspecified: Secondary | ICD-10-CM

## 2015-12-06 LAB — POCT INR: INR: 2.7

## 2015-12-27 ENCOUNTER — Ambulatory Visit (INDEPENDENT_AMBULATORY_CARE_PROVIDER_SITE_OTHER): Payer: Medicare Other

## 2015-12-27 DIAGNOSIS — I059 Rheumatic mitral valve disease, unspecified: Secondary | ICD-10-CM | POA: Diagnosis not present

## 2015-12-27 DIAGNOSIS — Z9889 Other specified postprocedural states: Secondary | ICD-10-CM | POA: Diagnosis not present

## 2015-12-27 DIAGNOSIS — Z952 Presence of prosthetic heart valve: Secondary | ICD-10-CM

## 2015-12-27 DIAGNOSIS — Z5181 Encounter for therapeutic drug level monitoring: Secondary | ICD-10-CM | POA: Diagnosis not present

## 2015-12-27 DIAGNOSIS — Z954 Presence of other heart-valve replacement: Secondary | ICD-10-CM | POA: Diagnosis not present

## 2015-12-27 LAB — POCT INR: INR: 2.4

## 2016-01-24 ENCOUNTER — Ambulatory Visit (INDEPENDENT_AMBULATORY_CARE_PROVIDER_SITE_OTHER): Payer: Medicare Other

## 2016-01-24 DIAGNOSIS — Z952 Presence of prosthetic heart valve: Secondary | ICD-10-CM

## 2016-01-24 DIAGNOSIS — Z954 Presence of other heart-valve replacement: Secondary | ICD-10-CM | POA: Diagnosis not present

## 2016-01-24 DIAGNOSIS — Z9889 Other specified postprocedural states: Secondary | ICD-10-CM | POA: Diagnosis not present

## 2016-01-24 DIAGNOSIS — Z5181 Encounter for therapeutic drug level monitoring: Secondary | ICD-10-CM

## 2016-01-24 DIAGNOSIS — I059 Rheumatic mitral valve disease, unspecified: Secondary | ICD-10-CM | POA: Diagnosis not present

## 2016-01-24 LAB — POCT INR: INR: 1.5

## 2016-02-06 ENCOUNTER — Other Ambulatory Visit: Payer: Self-pay | Admitting: Cardiovascular Disease

## 2016-02-07 ENCOUNTER — Ambulatory Visit (INDEPENDENT_AMBULATORY_CARE_PROVIDER_SITE_OTHER): Payer: Medicare Other

## 2016-02-07 DIAGNOSIS — Z9889 Other specified postprocedural states: Secondary | ICD-10-CM | POA: Diagnosis not present

## 2016-02-07 DIAGNOSIS — I059 Rheumatic mitral valve disease, unspecified: Secondary | ICD-10-CM | POA: Diagnosis not present

## 2016-02-07 DIAGNOSIS — Z954 Presence of other heart-valve replacement: Secondary | ICD-10-CM

## 2016-02-07 DIAGNOSIS — Z5181 Encounter for therapeutic drug level monitoring: Secondary | ICD-10-CM | POA: Diagnosis not present

## 2016-02-07 DIAGNOSIS — Z952 Presence of prosthetic heart valve: Secondary | ICD-10-CM

## 2016-02-07 LAB — POCT INR: INR: 3.2

## 2016-02-13 ENCOUNTER — Ambulatory Visit: Payer: Medicare Other | Admitting: Cardiovascular Disease

## 2016-02-28 ENCOUNTER — Ambulatory Visit (INDEPENDENT_AMBULATORY_CARE_PROVIDER_SITE_OTHER): Payer: Medicare Other

## 2016-02-28 DIAGNOSIS — Z952 Presence of prosthetic heart valve: Secondary | ICD-10-CM

## 2016-02-28 DIAGNOSIS — Z5181 Encounter for therapeutic drug level monitoring: Secondary | ICD-10-CM | POA: Diagnosis not present

## 2016-02-28 DIAGNOSIS — I059 Rheumatic mitral valve disease, unspecified: Secondary | ICD-10-CM

## 2016-02-28 DIAGNOSIS — Z9889 Other specified postprocedural states: Secondary | ICD-10-CM | POA: Diagnosis not present

## 2016-02-28 DIAGNOSIS — Z954 Presence of other heart-valve replacement: Secondary | ICD-10-CM

## 2016-02-28 LAB — POCT INR: INR: 1.6

## 2016-03-08 ENCOUNTER — Telehealth: Payer: Self-pay | Admitting: Cardiovascular Disease

## 2016-03-08 MED ORDER — WARFARIN SODIUM 6 MG PO TABS: ORAL_TABLET | ORAL | Status: DC

## 2016-03-08 NOTE — Telephone Encounter (Signed)
°*  STAT* If patient is at the pharmacy, call can be transferred to refill team.   1. Which medications need to be refilled? (please list name of each medication and dose if known) Coumadin  Dosed by coumadin clinic  2. Which pharmacy/location (including street and city if local pharmacy) is medication to be sent to?  [facility] [place] - Please make this the primary pharmacy on file   3. Do they need a 30 day or 90 day supply?  90

## 2016-03-20 ENCOUNTER — Ambulatory Visit (INDEPENDENT_AMBULATORY_CARE_PROVIDER_SITE_OTHER): Payer: Medicare Other

## 2016-03-20 DIAGNOSIS — I059 Rheumatic mitral valve disease, unspecified: Secondary | ICD-10-CM

## 2016-03-20 DIAGNOSIS — Z9889 Other specified postprocedural states: Secondary | ICD-10-CM | POA: Diagnosis not present

## 2016-03-20 DIAGNOSIS — Z5181 Encounter for therapeutic drug level monitoring: Secondary | ICD-10-CM | POA: Diagnosis not present

## 2016-03-20 DIAGNOSIS — Z952 Presence of prosthetic heart valve: Secondary | ICD-10-CM

## 2016-03-20 DIAGNOSIS — Z954 Presence of other heart-valve replacement: Secondary | ICD-10-CM | POA: Diagnosis not present

## 2016-03-20 LAB — POCT INR: INR: 1.7

## 2016-04-04 ENCOUNTER — Ambulatory Visit (INDEPENDENT_AMBULATORY_CARE_PROVIDER_SITE_OTHER): Payer: Medicare Other

## 2016-04-04 ENCOUNTER — Encounter: Payer: Self-pay | Admitting: Cardiovascular Disease

## 2016-04-04 ENCOUNTER — Ambulatory Visit (INDEPENDENT_AMBULATORY_CARE_PROVIDER_SITE_OTHER): Payer: Medicare Other | Admitting: Cardiovascular Disease

## 2016-04-04 VITALS — BP 128/72 | HR 86 | Ht 64.0 in | Wt 116.5 lb

## 2016-04-04 DIAGNOSIS — Z5181 Encounter for therapeutic drug level monitoring: Secondary | ICD-10-CM | POA: Diagnosis not present

## 2016-04-04 DIAGNOSIS — Z9889 Other specified postprocedural states: Secondary | ICD-10-CM | POA: Diagnosis not present

## 2016-04-04 DIAGNOSIS — D649 Anemia, unspecified: Secondary | ICD-10-CM

## 2016-04-04 DIAGNOSIS — I059 Rheumatic mitral valve disease, unspecified: Secondary | ICD-10-CM

## 2016-04-04 DIAGNOSIS — E785 Hyperlipidemia, unspecified: Secondary | ICD-10-CM

## 2016-04-04 DIAGNOSIS — I25708 Atherosclerosis of coronary artery bypass graft(s), unspecified, with other forms of angina pectoris: Secondary | ICD-10-CM

## 2016-04-04 DIAGNOSIS — I10 Essential (primary) hypertension: Secondary | ICD-10-CM | POA: Diagnosis not present

## 2016-04-04 DIAGNOSIS — E1159 Type 2 diabetes mellitus with other circulatory complications: Secondary | ICD-10-CM

## 2016-04-04 DIAGNOSIS — Z952 Presence of prosthetic heart valve: Secondary | ICD-10-CM

## 2016-04-04 DIAGNOSIS — Z954 Presence of other heart-valve replacement: Secondary | ICD-10-CM

## 2016-04-04 DIAGNOSIS — R0602 Shortness of breath: Secondary | ICD-10-CM | POA: Insufficient documentation

## 2016-04-04 LAB — POCT INR: INR: 1.9

## 2016-04-04 MED ORDER — POTASSIUM CHLORIDE ER 10 MEQ PO TBCR: 10.0000 meq | EXTENDED_RELEASE_TABLET | Freq: Every day | ORAL | Status: DC | PRN

## 2016-04-04 MED ORDER — FUROSEMIDE 20 MG PO TABS: 20.0000 mg | ORAL_TABLET | Freq: Every day | ORAL | Status: DC | PRN

## 2016-04-04 NOTE — Progress Notes (Signed)
Patient ID: Amanda [name], female   DOB: 1925-05-24, 82 y.o.   MRN: [id number] Cardiology Office Note  Date:  04/04/2016   ID:  Amanda [name], DOB 13-Jul-1925, MRN [id number]  PCP:  Junius Roads, MD   Chief Complaint  Patient presents with  . other    6 month follow up. Meds reviewed by the patient verbally. Pt. c/o shortness of breath with walking a short distance.     HPI:  82 -year-old woman with a past medical history of coronary artery disease, bypass surgery in 2004 at [facility] with a mechanical mitral valve on warfarin, with history of hypertension, hyperlipidemia who presents for routine followup of her coronary artery disease. Recent trip to the emergency room last week for severe lower abdominal pain. CTA of the abdomen showed atherosclerosis of aorta, celiac vessels Prior history of nausea  In follow-up, she reports that she has noticed increasing shortness of breath over the past several weeks Perhaps some lower extremity edema Able to walk short distance without symptoms, but walking from room to room in her house she has shortness of breath symptoms Denies any significant weight gain, change in diet. Denies excessive fluid intake.  Prior echocardiogram from April 2015 reviewed with her ejection fraction 45-50%, mild to moderate AI, mild stenosis from bioprosthetic mitral valve  Prior history of abdominal discomfort, history of nausea Previous Lab work shows total cholesterol 214, LDL 111, hemoglobin A1c 8.4.  Low vitamin D  EKG shows normal sinus rhythm with rate 86 bpm, right bundle branch block.  Other past medical history Prior hospitalization at [facility] 01/14/2014 with chest pain. Cardiac catheterization showed patent grafts, saphenous vein graft to the RCA and OM, patent graft to the mid LAD. She has severe three-vessel disease. Medical management was recommended. In follow-up she was feeling weak. high fall risk, not able to do her ADLs with  assistance devices. Needing a walker and full assistance  Previous total cholesterol 178, LDL 78 Previous hemoglobin A1c greater than 8  PMH:   has a past medical history of Coronary artery disease; Mitral valve disease; Hyperlipidemia; Hypertension; Diabetes mellitus without complication (HCC); and NSTEMI (non-ST elevated myocardial infarction) (HCC) (01/15/2014).  PSH:    Past Surgical History  Procedure Laterality Date  . Coronary artery bypass graft    . Mitral valve replacement    . Cardiac catheterization    . Left heart catheterization with coronary angiogram N/A 01/17/2014    Procedure: LEFT HEART CATHETERIZATION WITH CORONARY ANGIOGRAM;  Surgeon: Lennette Bihari, MD;  Location: [facility] CATH LAB;  Service: Cardiovascular;  Laterality: N/A;    Current Outpatient Prescriptions  Medication Sig Dispense Refill  . amLODipine (NORVASC) 5 MG tablet Take 5 mg by mouth daily.   4  . aspirin 81 MG EC tablet Take 81 mg by mouth daily.      . benazepril (LOTENSIN) 20 MG tablet Take 20 mg by mouth daily.   3  . Calcium Carbonate (CALTRATE 600 PO) Take 1 tablet by mouth daily.    . Cholecalciferol (D-3-5) 5000 UNITS capsule Take 5,000 Units by mouth daily.    Marland Kitchen ezetimibe (ZETIA) 10 MG tablet Take 1 tablet (10 mg total) by mouth daily. 30 tablet 11  . glimepiride (AMARYL) 2 MG tablet Take 1.5 tablets daily with breakfast  1  . guaiFENesin (MUCINEX) 600 MG 12 hr tablet Take 600 mg by mouth 2 (two) times daily as needed for cough or to loosen phlegm.    Marland Kitchen  isosorbide mononitrate (IMDUR) 30 MG 24 hr tablet TAKE 1/2 TABLET BY MOUTH DAILY 45 tablet 6  . ketorolac (TORADOL) 10 MG tablet Take 10 mg by mouth every 6 (six) hours as needed.    Marland Kitchen. levothyroxine (SYNTHROID, LEVOTHROID) 25 MCG tablet Take 25 mcg by mouth daily before breakfast.    . loratadine (CLARITIN) 10 MG tablet Take 10 mg by mouth daily.    . magnesium hydroxide (MILK OF MAGNESIA) 400 MG/5ML suspension Take 15 mLs by mouth at bedtime.    .  metFORMIN (GLUMETZA) 500 MG (MOD) 24 hr tablet Take 1,000 mg by mouth daily with breakfast.     . metoprolol succinate (TOPROL-XL) 50 MG 24 hr tablet TAKE 1 1/2 TABLETS BY MOUTH DAILY. TAKE WITH OR IMMEDIATELY FOLLOWING A MEAL 135 tablet 3  . nitroGLYCERIN (NITROSTAT) 0.4 MG SL tablet Place 1 tablet (0.4 mg total) under the tongue every 5 (five) minutes as needed for chest pain. 25 tablet 3  . omeprazole (PRILOSEC) 20 MG capsule Take 20 mg by mouth daily.    . ondansetron (ZOFRAN) 4 MG tablet Take 1 tablet (4 mg total) by mouth every 8 (eight) hours as needed for nausea or vomiting. 10 tablet 6  . rosuvastatin (CRESTOR) 40 MG tablet Take 1 tablet (40 mg total) by mouth at bedtime. 90 tablet 3  . warfarin (COUMADIN) 6 MG tablet Take 1/2-1 tablet by mouth daily as directed by anticoagulation clinic. 90 tablet 1  . furosemide (LASIX) 20 MG tablet Take 1 tablet (20 mg total) by mouth daily as needed. 30 tablet 3  . potassium chloride (K-DUR) 10 MEQ tablet Take 1 tablet (10 mEq total) by mouth daily as needed. 30 tablet 3   No current facility-administered medications for this visit.     Allergies:   Codeine; Penicillins; and Tramadol   Social History:  The patient  reports that she has never smoked. Her smokeless tobacco use includes Snuff. She reports that she does not drink alcohol or use illicit drugs.   Family History:   family history includes Coronary artery disease in her other; Diabetes in her other; Heart disease in her mother; Stroke in her other.    Review of Systems: Review of Systems  Constitutional: Negative.   Respiratory: Negative.   Cardiovascular: Negative.   Gastrointestinal: Negative.   Musculoskeletal: Negative.   Neurological: Negative.   Psychiatric/Behavioral: Negative.   All other systems reviewed and are negative.    PHYSICAL EXAM: VS:  BP 128/72 mmHg  Pulse 86  Ht 5\' 4"  (1.626 m)  Wt 116 lb 8 oz (52.844 kg)  BMI 19.99 kg/m2  SpO2 96% , BMI Body mass  index is 19.99 kg/(m^2). GEN: Well nourished, well developed, in no acute distress HEENT: normal Neck: no JVD, carotid bruits, or masses Cardiac: RRR;  2+ murmur right sternal border, no rubs, or gallops,no edema  Respiratory:  clear to auscultation bilaterally with scant rails at the bases, normal work of breathing GI: soft, nontender, nondistended, + BS MS: no deformity or atrophy Skin: warm and dry, no rash Neuro:  Strength and sensation are intact Psych: euthymic mood, full affect    Recent Labs: 08/11/2015: ALT 16; BUN 20; Creatinine, Ser 0.98; Hemoglobin 11.0*; Platelets 261; Potassium 4.0; Sodium 137   Lipid Panel No results found for: CHOL, HDL, LDLCALC, TRIG    Wt Readings from Last 3 Encounters:  04/04/16 116 lb 8 oz (52.844 kg)  08/14/15 123 lb 8 oz (56.019 kg)  08/11/15 128  lb (58.06 kg)       ASSESSMENT AND PLAN:  Atherosclerosis of coronary artery bypass graft of native heart with other forms of angina pectoris (HCC) - Plan: EKG 12-Lead, ECHOCARDIOGRAM COMPLETE Currently with no symptoms of angina. No further workup at this time. Continue current medication regimen. If shortness of breath gets worse, would consider ischemia workup  Essential hypertension - Plan: EKG 12-Lead, ECHOCARDIOGRAM COMPLETE Blood pressure is well controlled on today's visit. No changes made to the medications.  Hyperlipidemia Recent lab work showing total cholesterol 119, LDL 46  MITRAL VALVE REPLACEMENT, HX OF, 2004, 27 mmmechanical valve Echocardiogram ordered, last in 2014 Worsening shortness of breath  Anemia, unspecified anemia type Recommended if shortness breath symptoms do not improve with Lasix when necessary that we recheck a CBC given prior history of anemia  Shortness of breath Etiology unclear though concerning for acute on chronic systolic and systolic CHF She does report trace leg edema Recommended she take Lasix once or twice per week with potassium  Diabetes  type 2 Hemoglobin A1c 10.5 Managed by primary care   Total encounter time more than 25 minutes  Greater than 50% was spent in counseling and coordination of care with the patient   Disposition:   F/U  6 months   Orders Placed This Encounter  Procedures  . EKG 12-Lead  . ECHOCARDIOGRAM COMPLETE     Signed, Dossie Arbour, M.D., Ph.D. 04/04/2016  [facility] [place], [place] [phone]

## 2016-04-04 NOTE — Patient Instructions (Addendum)
You are doing well.  Please take lasix  as needed for shortness of breath (start once to twice a week if need) Take with potassium  We will schedule an echocardiogram for shortness of breath, mitral valve replacement  Your physician has requested that you have an echocardiogram. Echocardiography is a painless test that uses sound waves to create images of your heart. It provides your doctor with information about the size and shape of your heart and how well your heart's chambers and valves are working. This procedure takes approximately one hour. There are no restrictions for this procedure.  Date & time: _______________________________________  Please call us if you have new issues that need to be addressed before your next appt.  Your physician wants you to follow-up in: 6 months.  You will receive a reminder letter in the mail two months in advance. If you don't receive a letter, please call our office to schedule the follow-up appointment.  Echocardiogram An echocardiogram, or echocardiography, uses sound waves (ultrasound) to produce an image of your heart. The echocardiogram is simple, painless, obtained within a short period of time, and offers valuable information to your health care provider. The images from an echocardiogram can provide information such as:  Evidence of coronary artery disease (CAD).  Heart size.  Heart muscle function.  Heart valve function.  Aneurysm detection.  Evidence of a past heart attack.  Fluid buildup around the heart.  Heart muscle thickening.  Assess heart valve function. LET [facility] PROVIDER KNOW ABOUT:  Any allergies you have.  All medicines you are taking, including vitamins, herbs, eye drops, creams, and over-the-counter medicines.  Previous problems you or members of your family have had with the use of anesthetics.  Any blood disorders you have.  Previous surgeries you have had.  Medical conditions you  have.  Possibility of pregnancy, if this applies. BEFORE THE PROCEDURE  No special preparation is needed. Eat and drink normally.  PROCEDURE   In order to produce an image of your heart, gel will be applied to your chest and a wand-like tool (transducer) will be moved over your chest. The gel will help transmit the sound waves from the transducer. The sound waves will harmlessly bounce off your heart to allow the heart images to be captured in real-time motion. These images will then be recorded.  You may need an IV to receive a medicine that improves the quality of the pictures. AFTER THE PROCEDURE You may return to your normal schedule including diet, activities, and medicines, unless your health care provider tells you otherwise.   This information is not intended to replace advice given to you by your health care provider. Make sure you discuss any questions you have with your health care provider.   Document Released: 10/04/2000 Document Revised: 10/28/2014 Document Reviewed: 06/14/2013 Elsevier Interactive Patient Education [employer]2016 Elsevier Inc.

## 2016-04-09 ENCOUNTER — Encounter: Payer: Self-pay | Admitting: Cardiovascular Disease

## 2016-04-19 ENCOUNTER — Ambulatory Visit (INDEPENDENT_AMBULATORY_CARE_PROVIDER_SITE_OTHER): Payer: Medicare Other

## 2016-04-19 ENCOUNTER — Other Ambulatory Visit: Payer: Self-pay

## 2016-04-19 DIAGNOSIS — Z954 Presence of other heart-valve replacement: Secondary | ICD-10-CM

## 2016-04-19 DIAGNOSIS — Z952 Presence of prosthetic heart valve: Secondary | ICD-10-CM

## 2016-04-19 DIAGNOSIS — Z5181 Encounter for therapeutic drug level monitoring: Secondary | ICD-10-CM

## 2016-04-19 DIAGNOSIS — I25708 Atherosclerosis of coronary artery bypass graft(s), unspecified, with other forms of angina pectoris: Secondary | ICD-10-CM

## 2016-04-19 DIAGNOSIS — Z9889 Other specified postprocedural states: Secondary | ICD-10-CM

## 2016-04-19 DIAGNOSIS — I10 Essential (primary) hypertension: Secondary | ICD-10-CM

## 2016-04-19 DIAGNOSIS — I059 Rheumatic mitral valve disease, unspecified: Secondary | ICD-10-CM

## 2016-04-19 LAB — POCT INR: INR: 2

## 2016-04-19 NOTE — Addendum Note (Signed)
Addended by: Shon BatonYOW, SHARON H on: 04/19/2016 01:55 PM   Modules accepted: Level of Service

## 2016-04-21 LAB — ECHOCARDIOGRAM COMPLETE
AO mean calculated velocity dopler: 82.9 cm/s
AV Area mean vel: 1.17 cm2
AV Mean grad: 3 mmHg
AV Peak grad: 7 mmHg
AV area mean vel ind: 0.76 cm2/m2
AV peak Index: 0.77
AVAREAVTI: 1.18 cm2
AVAREAVTIIND: 0.73 cm2/m2
AVCELMEANRAT: 0.88
AVPKVEL: 135 cm/s
Ao pk vel: 0.89 m/s
CHL CUP AV VALUE AREA INDEX: 0.73
CHL CUP AV VEL: 1.12
CHL CUP DOP CALC LVOT VTI: 16.3 cm
CHL CUP LVOT MV VTI INDEX: 0.61 cm2/m2
CHL CUP LVOT MV VTI: 0.94
CHL CUP MV M VEL: 75.7
E decel time: 137 msec
LA vol A4C: 32.8 ml
LDCA: 1.33 cm2
LVOT SV: 22 mL
LVOT peak grad rest: 6 mmHg
LVOT peak vel: 120 cm/s
LVOTD: 13 mm
LVOTVTI: 0.84 cm
MV Annulus VTI: 23 cm
MV Dec: 137
MV Peak grad: 8 mmHg
MVPKEVEL: 142 m/s
Mean grad: 4 mmHg
VTI: 19.3 cm
Valve area: 1.12 cm2

## 2016-05-01 ENCOUNTER — Encounter: Payer: Self-pay | Admitting: Cardiovascular Disease

## 2016-05-01 ENCOUNTER — Other Ambulatory Visit: Payer: Self-pay | Admitting: *Deleted

## 2016-05-01 ENCOUNTER — Ambulatory Visit (INDEPENDENT_AMBULATORY_CARE_PROVIDER_SITE_OTHER): Payer: Medicare Other | Admitting: *Deleted

## 2016-05-01 DIAGNOSIS — Z952 Presence of prosthetic heart valve: Secondary | ICD-10-CM

## 2016-05-01 DIAGNOSIS — I059 Rheumatic mitral valve disease, unspecified: Secondary | ICD-10-CM | POA: Diagnosis not present

## 2016-05-01 DIAGNOSIS — Z9889 Other specified postprocedural states: Secondary | ICD-10-CM | POA: Diagnosis not present

## 2016-05-01 DIAGNOSIS — Z5181 Encounter for therapeutic drug level monitoring: Secondary | ICD-10-CM | POA: Diagnosis not present

## 2016-05-01 DIAGNOSIS — Z954 Presence of other heart-valve replacement: Secondary | ICD-10-CM

## 2016-05-01 LAB — POCT INR: INR: 2.1

## 2016-05-01 MED ORDER — NITROGLYCERIN 0.4 MG SL SUBL: 0.4000 mg | SUBLINGUAL_TABLET | SUBLINGUAL | Status: AC | PRN

## 2016-05-02 ENCOUNTER — Encounter: Payer: Self-pay | Admitting: *Deleted

## 2016-05-21 ENCOUNTER — Ambulatory Visit (INDEPENDENT_AMBULATORY_CARE_PROVIDER_SITE_OTHER): Payer: Medicare Other | Admitting: *Deleted

## 2016-05-21 DIAGNOSIS — Z9889 Other specified postprocedural states: Secondary | ICD-10-CM | POA: Diagnosis not present

## 2016-05-21 DIAGNOSIS — Z5181 Encounter for therapeutic drug level monitoring: Secondary | ICD-10-CM | POA: Diagnosis not present

## 2016-05-21 DIAGNOSIS — I059 Rheumatic mitral valve disease, unspecified: Secondary | ICD-10-CM

## 2016-05-21 DIAGNOSIS — Z954 Presence of other heart-valve replacement: Secondary | ICD-10-CM | POA: Diagnosis not present

## 2016-05-21 DIAGNOSIS — Z952 Presence of prosthetic heart valve: Secondary | ICD-10-CM

## 2016-05-21 LAB — POCT INR: INR: 2.6

## 2016-05-22 ENCOUNTER — Other Ambulatory Visit: Payer: Self-pay | Admitting: Cardiovascular Disease

## 2016-05-22 NOTE — Telephone Encounter (Signed)
Review for refill, Thank you. 

## 2016-06-19 ENCOUNTER — Ambulatory Visit (INDEPENDENT_AMBULATORY_CARE_PROVIDER_SITE_OTHER): Payer: Medicare Other | Admitting: *Deleted

## 2016-06-19 DIAGNOSIS — Z5181 Encounter for therapeutic drug level monitoring: Secondary | ICD-10-CM

## 2016-06-19 DIAGNOSIS — Z9889 Other specified postprocedural states: Secondary | ICD-10-CM

## 2016-06-19 DIAGNOSIS — Z952 Presence of prosthetic heart valve: Secondary | ICD-10-CM

## 2016-06-19 DIAGNOSIS — Z954 Presence of other heart-valve replacement: Secondary | ICD-10-CM

## 2016-06-19 DIAGNOSIS — I059 Rheumatic mitral valve disease, unspecified: Secondary | ICD-10-CM

## 2016-06-19 LAB — POCT INR: INR: 2.6

## 2016-07-31 ENCOUNTER — Ambulatory Visit (INDEPENDENT_AMBULATORY_CARE_PROVIDER_SITE_OTHER): Payer: Medicare Other | Admitting: *Deleted

## 2016-07-31 DIAGNOSIS — Z9889 Other specified postprocedural states: Secondary | ICD-10-CM

## 2016-07-31 DIAGNOSIS — I059 Rheumatic mitral valve disease, unspecified: Secondary | ICD-10-CM

## 2016-07-31 DIAGNOSIS — Z5181 Encounter for therapeutic drug level monitoring: Secondary | ICD-10-CM

## 2016-07-31 DIAGNOSIS — Z952 Presence of prosthetic heart valve: Secondary | ICD-10-CM

## 2016-07-31 DIAGNOSIS — Z23 Encounter for immunization: Secondary | ICD-10-CM

## 2016-07-31 LAB — POCT INR: INR: 1.9

## 2016-08-08 ENCOUNTER — Other Ambulatory Visit: Payer: Self-pay | Admitting: Cardiovascular Disease

## 2016-08-09 ENCOUNTER — Other Ambulatory Visit: Payer: Self-pay | Admitting: Cardiovascular Disease

## 2016-08-15 ENCOUNTER — Other Ambulatory Visit: Payer: Self-pay | Admitting: Cardiovascular Disease

## 2016-08-28 ENCOUNTER — Ambulatory Visit (INDEPENDENT_AMBULATORY_CARE_PROVIDER_SITE_OTHER): Payer: Medicare Other

## 2016-08-28 DIAGNOSIS — Z9889 Other specified postprocedural states: Secondary | ICD-10-CM

## 2016-08-28 DIAGNOSIS — I059 Rheumatic mitral valve disease, unspecified: Secondary | ICD-10-CM

## 2016-08-28 DIAGNOSIS — Z952 Presence of prosthetic heart valve: Secondary | ICD-10-CM | POA: Diagnosis not present

## 2016-08-28 DIAGNOSIS — Z5181 Encounter for therapeutic drug level monitoring: Secondary | ICD-10-CM | POA: Diagnosis not present

## 2016-08-28 LAB — POCT INR: INR: 1.8

## 2016-08-29 ENCOUNTER — Telehealth: Payer: Self-pay | Admitting: Cardiovascular Disease

## 2016-08-29 NOTE — Telephone Encounter (Signed)
Received cardiac clearance request for pt to proceed w/ EGD w/ dilatation on 09/11/16.  Pt is on coumadin and they have attached lovenox bridge instructions for approval:  Medication: Coumadin 6 mg as directed Wt: 116 lb/2.2 = 53 kg Lovenox dose = 60 mg  Last dose of coumadin: 09/06/16 No coumadin: 09/07/16 Lovenox SQ injections 11/19, 11/20, 11/21 (BID, 12 hrs apart) Day of procedure: 09/11/16 (No coumadin, no lovenox)  After your procedure, the physician will instruct you on when to restart your coumadin.  Please route [facility] at [phone].

## 2016-08-29 NOTE — Telephone Encounter (Signed)
Would recommend last dose of Coumadin on 11/16 for full 5 day Coumadin washout prior to procedure. Patient should only receive AM dose of Lovenox on 11/21 for 24 hour washout prior to procedure. Please notify patient and her son of Lovenox instructions [facility].

## 2016-09-03 ENCOUNTER — Telehealth: Payer: Self-pay | Admitting: Cardiovascular Disease

## 2016-09-03 NOTE — Telephone Encounter (Signed)
Performing office has received coumadin/lovenox instructions, they just need your clearance to proceed.

## 2016-09-03 NOTE — Telephone Encounter (Signed)
Request for surgical clearance:  1. What type of surgery is being performed? Upper Endo with dilation  2. When is this surgery scheduled? 09/11/16  3. Are there any medications that need to be held prior to surgery and how long? Coumadin  4. Name of physician performing surgery? Dr. Rogelio SeenMcCall  5. What is your office phone and fax number? Faxed over a surgical clearance, please send this back STAT.   Tele [phone] or fax [phone]

## 2016-09-03 NOTE — Telephone Encounter (Signed)
Please see previous note.

## 2016-09-04 NOTE — Telephone Encounter (Signed)
Called spoke with pt's son Kathlene NovemberMike advised of clearance and Lovenox instructions. He verbalized understanding. Will route clearance to [facility].

## 2016-09-04 NOTE — Telephone Encounter (Signed)
-----   Message from Antonieta Ibaimothy J Gollan, MD sent at 08/31/2016  1:08 PM EST ----- Should be acceptable risk to hold the Coumadin thx TG  ----- Message ----- From: Satira SarkErika W Nagi Furio, RN Sent: 08/28/2016  12:47 PM To: Antonieta Ibaimothy J Gollan, MD  Pt is scheduled for an endoscopy and esophageal dilatation on 09/11/16 with Dr. Rogelio SeenMcCall.  He would like for pt to hold Coumadin x 5 days prior to procedure.  Please advise if ok to hold Coumadin.  Thanks

## 2016-09-09 ENCOUNTER — Emergency Department
Admission: EM | Admit: 2016-09-09 | Discharge: 2016-09-09 | Disposition: A | Discharge status: Home / Self Care | Payer: Medicare Other | Attending: Emergency Medicine | Admitting: Emergency Medicine

## 2016-09-09 ENCOUNTER — Emergency Department: Payer: Medicare Other

## 2016-09-09 DIAGNOSIS — I2510 Atherosclerotic heart disease of native coronary artery without angina pectoris: Secondary | ICD-10-CM | POA: Insufficient documentation

## 2016-09-09 DIAGNOSIS — Z7982 Long term (current) use of aspirin: Secondary | ICD-10-CM | POA: Diagnosis not present

## 2016-09-09 DIAGNOSIS — Z7901 Long term (current) use of anticoagulants: Secondary | ICD-10-CM | POA: Insufficient documentation

## 2016-09-09 DIAGNOSIS — Z955 Presence of coronary angioplasty implant and graft: Secondary | ICD-10-CM | POA: Insufficient documentation

## 2016-09-09 DIAGNOSIS — I10 Essential (primary) hypertension: Secondary | ICD-10-CM | POA: Insufficient documentation

## 2016-09-09 DIAGNOSIS — R531 Weakness: Secondary | ICD-10-CM

## 2016-09-09 DIAGNOSIS — Z79899 Other long term (current) drug therapy: Secondary | ICD-10-CM | POA: Diagnosis not present

## 2016-09-09 DIAGNOSIS — F17290 Nicotine dependence, other tobacco product, uncomplicated: Secondary | ICD-10-CM | POA: Insufficient documentation

## 2016-09-09 DIAGNOSIS — E860 Dehydration: Secondary | ICD-10-CM | POA: Insufficient documentation

## 2016-09-09 DIAGNOSIS — R410 Disorientation, unspecified: Secondary | ICD-10-CM | POA: Diagnosis present

## 2016-09-09 DIAGNOSIS — R5383 Other fatigue: Secondary | ICD-10-CM

## 2016-09-09 DIAGNOSIS — E119 Type 2 diabetes mellitus without complications: Secondary | ICD-10-CM | POA: Diagnosis not present

## 2016-09-09 LAB — URINALYSIS COMPLETE WITH MICROSCOPIC (ARMC ONLY)
Bacteria, UA: NONE SEEN
Bilirubin Urine: NEGATIVE
KETONES UR: NEGATIVE mg/dL
Leukocytes, UA: NEGATIVE
Nitrite: NEGATIVE
Protein, ur: 30 mg/dL — AB
SPECIFIC GRAVITY, URINE: 1.008 (ref 1.005–1.030)
WBC UA: NONE SEEN WBC/hpf (ref 0–5)
pH: 6 (ref 5.0–8.0)

## 2016-09-09 LAB — COMPREHENSIVE METABOLIC PANEL
ALBUMIN: 4.2 g/dL (ref 3.5–5.0)
ALT: 26 U/L (ref 14–54)
ANION GAP: 9 (ref 5–15)
AST: 34 U/L (ref 15–41)
Alkaline Phosphatase: 55 U/L (ref 38–126)
BILIRUBIN TOTAL: 0.6 mg/dL (ref 0.3–1.2)
BUN: 34 mg/dL — ABNORMAL HIGH (ref 6–20)
CO2: 28 mmol/L (ref 22–32)
Calcium: 10.7 mg/dL — ABNORMAL HIGH (ref 8.9–10.3)
Chloride: 98 mmol/L — ABNORMAL LOW (ref 101–111)
Creatinine, Ser: 1.31 mg/dL — ABNORMAL HIGH (ref 0.44–1.00)
GFR calc Af Amer: 41 mL/min — ABNORMAL LOW (ref >60)
GFR, EST NON AFRICAN AMERICAN: 35 mL/min — AB (ref >60)
Glucose, Bld: 296 mg/dL — ABNORMAL HIGH (ref 65–99)
POTASSIUM: 3.5 mmol/L (ref 3.5–5.1)
Sodium: 135 mmol/L (ref 135–145)
TOTAL PROTEIN: 7.7 g/dL (ref 6.5–8.1)

## 2016-09-09 LAB — BASIC METABOLIC PANEL
ANION GAP: 6 (ref 5–15)
BUN: 27 mg/dL — ABNORMAL HIGH (ref 6–20)
CO2: 28 mmol/L (ref 22–32)
Calcium: 9.9 mg/dL (ref 8.9–10.3)
Chloride: 101 mmol/L (ref 101–111)
Creatinine, Ser: 1.24 mg/dL — ABNORMAL HIGH (ref 0.44–1.00)
GFR calc Af Amer: 43 mL/min — ABNORMAL LOW (ref >60)
GFR, EST NON AFRICAN AMERICAN: 37 mL/min — AB (ref >60)
GLUCOSE: 358 mg/dL — AB (ref 65–99)
POTASSIUM: 3.2 mmol/L — AB (ref 3.5–5.1)
Sodium: 135 mmol/L (ref 135–145)

## 2016-09-09 LAB — CBC
HEMATOCRIT: 34.2 % — AB (ref 35.0–47.0)
HEMOGLOBIN: 11.6 g/dL — AB (ref 12.0–16.0)
MCH: 29.7 pg (ref 26.0–34.0)
MCHC: 33.9 g/dL (ref 32.0–36.0)
MCV: 87.6 fL (ref 80.0–100.0)
Platelets: 205 10*3/uL (ref 150–440)
RBC: 3.9 MIL/uL (ref 3.80–5.20)
RDW: 13.5 % (ref 11.5–14.5)
WBC: 8.4 10*3/uL (ref 3.6–11.0)

## 2016-09-09 LAB — LACTIC ACID, PLASMA: LACTIC ACID, VENOUS: 1.3 mmol/L (ref 0.5–1.9)

## 2016-09-09 LAB — APTT: APTT: 49 s — AB (ref 24–36)

## 2016-09-09 LAB — PROTIME-INR
INR: 1.74
Prothrombin Time: 20.6 seconds — ABNORMAL HIGH (ref 11.4–15.2)

## 2016-09-09 LAB — TROPONIN I
TROPONIN I: 0.03 ng/mL — AB (ref <0.03)
TROPONIN I: 0.03 ng/mL — AB (ref <0.03)

## 2016-09-09 MED ORDER — SODIUM CHLORIDE 0.9 % IV SOLN
Freq: Once | INTRAVENOUS | Status: AC
  Administered 2016-09-09: 16:00:00 via INTRAVENOUS

## 2016-09-09 MED ORDER — ENOXAPARIN SODIUM 60 MG/0.6ML ~~LOC~~ SOLN
50.0000 mg | Freq: Once | SUBCUTANEOUS | Status: AC
  Administered 2016-09-09: 50 mg via SUBCUTANEOUS
  Filled 2016-09-09: qty 0.6

## 2016-09-09 NOTE — ED Provider Notes (Signed)
Assumed care of patient from Dr. Juliette AlcideMelinda. After IV fluids, repeat BMP is improved. Repeat troponin is unchanged, 0.03, not consistent with ACS PE or dissection. Patient is not having any anginal or cardiopulmonary symptoms. We'll discharge home with son who will continue the Lovenox and follow-up. He is unremarkable. Patient is feeling better. Ears hemostatic with some dried blood in the canal.   Sharman CheekPhillip Krishav Mamone, MD 09/09/16 1850

## 2016-09-09 NOTE — ED Provider Notes (Addendum)
[facility] Emergency Department Provider Note   ____________________________________________   First MD Initiated Contact with Patient 09/09/16 1409     (approximate)  I have reviewed the triage vital signs and the nursing notes.   HISTORY  Chief Complaint Altered Mental Status and Coagulation Disorder (from left ear)  HPI Amanda [name] is a 82 y.o. female patient is not herself. She is been like this for the last 2-3 days feeling weak and confused. Today she began bleeding out of her left ear. He has a mechanical valve in her heart usually takes Coumadin but this was DC'd and Lovenox started because endoscopy is scheduled for Wednesday. She denies any fever or dysuria chest pain belly pain or other complaints just says her head feels funny.   Past Medical History:  Diagnosis Date  . Coronary artery disease   . Diabetes mellitus without complication (HCC)   . Hyperlipidemia   . Hypertension   . Mitral valve disease   . NSTEMI (non-ST elevated myocardial infarction) (HCC) 01/15/2014    Patient Active Problem List   Diagnosis Date Noted  . SOB (shortness of breath) 04/04/2016  . Abdominal pain 08/14/2015  . Nausea 02/22/2015  . Mitral valve disorder 02/14/2014  . Encounter for therapeutic drug monitoring 02/14/2014  . Anemia 01/21/2014  . Protein-calorie malnutrition, severe (HCC) 01/19/2014  . NSTEMI (non-ST elevated myocardial infarction) (HCC) 01/15/2014  . Diabetes mellitus (HCC) 05/29/2012  . Hypertension 05/29/2012  . Hyperlipidemia 04/05/2010  . CAD, ARTERY BYPASS GRAFT, 2004, LIMA-LAD; VG-RCA; VG-LCX 04/05/2010  . MITRAL VALVE REPLACEMENT, HX OF, 2004, 27 mmmechanical valve 12/27/2009    Past Surgical History:  Procedure Laterality Date  . CARDIAC CATHETERIZATION    . CORONARY ARTERY BYPASS GRAFT    . LEFT HEART CATHETERIZATION WITH CORONARY ANGIOGRAM N/A 01/17/2014   Procedure: LEFT HEART CATHETERIZATION WITH CORONARY ANGIOGRAM;   Surgeon: Lennette Bihari, MD;  Location: [facility] CATH LAB;  Service: Cardiovascular;  Laterality: N/A;  . MITRAL VALVE REPLACEMENT      Prior to Admission medications   Medication Sig Start Date End Date Taking? Authorizing Provider  amLODipine (NORVASC) 5 MG tablet Take 5 mg by mouth daily.  02/21/15  Yes Historical Provider, MD  aspirin 81 MG EC tablet Take 81 mg by mouth daily.     Yes Historical Provider, MD  benazepril (LOTENSIN) 20 MG tablet Take 20 mg by mouth daily.  02/21/15  Yes Historical Provider, MD  Cholecalciferol (D-3-5) 5000 UNITS capsule Take 5,000 Units by mouth daily.   Yes Historical Provider, MD  enoxaparin (LOVENOX) 60 MG/0.6ML injection Inject 60 mg into the skin.   Yes Historical Provider, MD  furosemide (LASIX) 20 MG tablet Take 1 tablet (20 mg total) by mouth daily as needed. 04/04/16  Yes Antonieta Iba, MD  isosorbide mononitrate (IMDUR) 30 MG 24 hr tablet TAKE 1/2 TABLET BY MOUTH DAILY 08/09/16  Yes Antonieta Iba, MD  loratadine (CLARITIN) 10 MG tablet Take 10 mg by mouth daily.   Yes Historical Provider, MD  metoprolol succinate (TOPROL-XL) 50 MG 24 hr tablet TAKE 1 1/2 TABLETS BY MOUTH DAILY. TAKE WITH OR IMMEDIATELY FOLLOWING A MEAL 02/06/16  Yes Antonieta Iba, MD  potassium chloride (K-DUR) 10 MEQ tablet Take 1 tablet (10 mEq total) by mouth daily as needed. 04/04/16  Yes Antonieta Iba, MD  rosuvastatin (CRESTOR) 40 MG tablet TAKE 1 TABLET(40 MG) BY MOUTH AT BEDTIME 08/08/16  Yes Antonieta Iba, MD  warfarin (COUMADIN)  6 MG tablet Take as directed by [facility] 05/22/16  Yes Antonieta Ibaimothy J Gollan, MD  ZETIA 10 MG tablet TAKE 1 TABLET BY MOUTH DAILY 08/15/16  Yes Antonieta Ibaimothy J Gollan, MD  glimepiride (AMARYL) 2 MG tablet Take 1.5 tablets daily with breakfast 07/25/14   Historical Provider, MD  guaiFENesin (MUCINEX) 600 MG 12 hr tablet Take 600 mg by mouth 2 (two) times daily as needed for cough or to loosen phlegm.    Historical Provider, MD  ketorolac (TORADOL) 10 MG  tablet Take 10 mg by mouth every 6 (six) hours as needed.    Historical Provider, MD  levothyroxine (SYNTHROID, LEVOTHROID) 25 MCG tablet Take 25 mcg by mouth daily before breakfast.    Historical Provider, MD  nitroGLYCERIN (NITROSTAT) 0.4 MG SL tablet Place 1 tablet (0.4 mg total) under the tongue every 5 (five) minutes as needed for chest pain. 05/01/16   Antonieta Ibaimothy J Gollan, MD    Allergies Codeine; Penicillins; and Tramadol  Family History  Problem Relation Age of Onset  . Heart disease Mother   . Coronary artery disease Other   . Diabetes Other   . Stroke Other     Social History Social History  Substance Use Topics  . Smoking status: Never Smoker  . Smokeless tobacco: Current User    Types: Snuff  . Alcohol use No    Review of Systems Constitutional: No fever/chills Eyes: No visual changes. ENT: No sore throat. Cardiovascular: Denies chest pain. Respiratory: Denies shortness of breath. Gastrointestinal: No abdominal pain.  No nausea, no vomiting.  No diarrhea.  No constipation. Genitourinary: Negative for dysuria. Musculoskeletal: Negative for back pain. Skin: Negative for rash. Neurological: Negative for headaches, focal weakness or numbness.  10-point ROS otherwise negative.  ____________________________________________   PHYSICAL EXAM:  VITAL SIGNS: ED Triage Vitals [09/09/16 1034]  Enc Vitals Group     BP (!) 160/66     Pulse Rate 93     Resp 18     Temp 97.5 F (36.4 C)     Temp Source Oral     SpO2 98 %     Weight 116 lb (52.6 kg)     Height 5\' 3"  (1.6 m)     Head Circumference      Peak Flow      Pain Score 4     Pain Loc      Pain Edu?      Excl. in GC?     Constitutional: Alert and oriented. Well appearing and in no acute distress. Eyes: Conjunctivae are normal. PERRL. EOMI. Head: Atraumatic. Ears TMs appear clear the left TM is partially obscured by some blood in the ear canal it appears that the ear canal has been scratched although I  cannot be sure. The blood is fairly deep inside. Nose: No congestion/rhinnorhea. Mouth/Throat: Mucous membranes are moist.  Oropharynx non-erythematous. Neck: No stridor.  Cardiovascular: Normal rate, regular rhythm. Grossly normal heart soundsI can hear the valve clicking clearly.  Good peripheral circulation. Respiratory: Normal respiratory effort.  No retractions. Lungs CTAB. Gastrointestinal: Soft and nontender. No distention. No abdominal bruits. No CVA tenderness. Musculoskeletal: No lower extremity tenderness nor edema.  No joint effusions. Neurologic:  Normal speech and language. No gross focal neurologic deficits are appreciated. No gait instability. Skin:  Skin is warm, dry and intact. No rash noted. Psychiatric: Mood and affect are normal. Speech and behavior are normal.  ____________________________________________   LABS (all labs ordered are listed, but only abnormal results  are displayed)  Labs Reviewed  COMPREHENSIVE METABOLIC PANEL - Abnormal; Notable for the following:       Result Value   Chloride 98 (*)    Glucose, Bld 296 (*)    BUN 34 (*)    Creatinine, Ser 1.31 (*)    Calcium 10.7 (*)    GFR calc non Af Amer 35 (*)    GFR calc Af Amer 41 (*)    All other components within normal limits  CBC - Abnormal; Notable for the following:    Hemoglobin 11.6 (*)    HCT 34.2 (*)    All other components within normal limits  PROTIME-INR - Abnormal; Notable for the following:    Prothrombin Time 20.6 (*)    All other components within normal limits  TROPONIN I - Abnormal; Notable for the following:    Troponin I 0.03 (*)    All other components within normal limits  APTT - Abnormal; Notable for the following:    aPTT 49 (*)    All other components within normal limits  LACTIC ACID, PLASMA  LACTIC ACID, PLASMA  URINALYSIS COMPLETEWITH MICROSCOPIC ([facility] ONLY)  TROPONIN I  BASIC METABOLIC PANEL  CBG MONITORING, ED    ____________________________________________  EKG  EKG read and interpreted by me shows normal sinus rhythm a rate of 92 seems like possibly extreme right axis. Patient has suggestion of ST depression in several leads. ____________________________________________  RADIOLOGY  __Study Result   CLINICAL DATA:  Awakened this morning with blood coming out the left year as well as the headache. 2-3 days of confusion with difficulty swallowing. History of CHF, previous MI, valve replacement, CABG.  EXAM: PORTABLE CHEST 1 VIEW  COMPARISON:  Report of a chest x-ray of April 04, 2003 and limited fluoro spot images of December 23, 2003.  FINDINGS: There is severe thoracic kyphosis. The lungs appear reasonably well inflated and clear. The heart and pulmonary vascularity are normal. There is no definite pleural effusion or pneumothorax. The mediastinum is normal in width. There is calcification in the wall of the aortic arch.  IMPRESSION: No definite acute cardiopulmonary abnormality.  Thoracic aortic atherosclerosis.   Electronically Signed   By: David  SwazilandJordan M.D.   On: 09/09/2016 14:42   Study Result   CLINICAL DATA:  Increasing confusion over the past few days  EXAM: CT HEAD WITHOUT CONTRAST  TECHNIQUE: Contiguous axial images were obtained from the base of the skull through the vertex without intravenous contrast.  COMPARISON:  None.  FINDINGS: Brain: No evidence of acute infarction, hemorrhage, hydrocephalus, extra-axial collection or mass lesion/mass effect. Scattered atrophic changes are noted as well as chronic white matter ischemic change.  Vascular: No hyperdense vessel or unexpected calcification.  Skull: Normal. Negative for fracture or focal lesion.  Sinuses/Orbits: No acute finding.  Other: None.  IMPRESSION: Chronic atrophic and ischemic changes without acute abnormality.   Electronically Signed   By: Alcide CleverMark  Lukens M.D.   On:  09/09/2016 14:48    __________________________________________   PROCEDURES  Procedure(s) performed:  Procedures  Critical Care performed:   ____________________________________________   INITIAL IMPRESSION / ASSESSMENT AND PLAN / ED COURSE  Pertinent labs & imaging results that were available during my care of the patient were reviewed by me and considered in my medical decision making (see chart for details).    Clinical Course   Patient's labs indicate slight dehydration. Patient's troponin is slightly elevated. I will give her some fluids and repeat the troponin and the  net B and disposition her from there. I will sign the patient out to Dr. Scotty Court. I should add that while the patient is not at her baseline she is one of the most alert patient's are seen today. ____________________________________________   FINAL CLINICAL IMPRESSION(S) / ED DIAGNOSES  Final diagnoses:  Weak      NEW MEDICATIONS STARTED DURING THIS VISIT:  New Prescriptions   No medications on file     Note:  This document was prepared using Dragon voice recognition software and may include unintentional dictation errors.    Arnaldo Natal, MD 09/09/16 1529    Arnaldo Natal, MD 09/09/16 1610    Arnaldo Natal, MD 09/09/16 [phone]

## 2016-09-09 NOTE — ED Triage Notes (Signed)
Pt is here with POA/nephew with c/o that the pt has had increased confusion for the past 2 days, states woke with bleeding from the left ear this morning.. States pt in normally on coumadin but has been on lovenox for the past few days due to up coming endoscopy.. Pt c/o ear pain..Marland Kitchen

## 2016-10-02 ENCOUNTER — Ambulatory Visit (INDEPENDENT_AMBULATORY_CARE_PROVIDER_SITE_OTHER): Payer: Medicare Other

## 2016-10-02 DIAGNOSIS — Z5181 Encounter for therapeutic drug level monitoring: Secondary | ICD-10-CM | POA: Diagnosis not present

## 2016-10-02 DIAGNOSIS — Z952 Presence of prosthetic heart valve: Secondary | ICD-10-CM | POA: Diagnosis not present

## 2016-10-02 DIAGNOSIS — I059 Rheumatic mitral valve disease, unspecified: Secondary | ICD-10-CM

## 2016-10-02 DIAGNOSIS — Z9889 Other specified postprocedural states: Secondary | ICD-10-CM

## 2016-10-02 LAB — POCT INR: INR: 1

## 2016-10-07 ENCOUNTER — Ambulatory Visit (INDEPENDENT_AMBULATORY_CARE_PROVIDER_SITE_OTHER): Payer: Medicare Other | Admitting: Pharmacist

## 2016-10-07 DIAGNOSIS — Z9889 Other specified postprocedural states: Secondary | ICD-10-CM

## 2016-10-07 DIAGNOSIS — I059 Rheumatic mitral valve disease, unspecified: Secondary | ICD-10-CM

## 2016-10-07 DIAGNOSIS — Z5181 Encounter for therapeutic drug level monitoring: Secondary | ICD-10-CM

## 2016-10-07 LAB — POCT INR: INR: 1.1

## 2016-10-08 ENCOUNTER — Emergency Department: Payer: Medicare Other

## 2016-10-08 ENCOUNTER — Observation Stay
Admission: EM | Admit: 2016-10-08 | Discharge: 2016-10-10 | Disposition: A | Discharge status: Home / Self Care | Payer: Medicare Other | Attending: Internal Medicine | Admitting: Internal Medicine

## 2016-10-08 ENCOUNTER — Encounter: Payer: Self-pay | Admitting: Emergency Medicine

## 2016-10-08 DIAGNOSIS — K222 Esophageal obstruction: Secondary | ICD-10-CM | POA: Insufficient documentation

## 2016-10-08 DIAGNOSIS — I059 Rheumatic mitral valve disease, unspecified: Secondary | ICD-10-CM | POA: Insufficient documentation

## 2016-10-08 DIAGNOSIS — K219 Gastro-esophageal reflux disease without esophagitis: Secondary | ICD-10-CM | POA: Insufficient documentation

## 2016-10-08 DIAGNOSIS — Z8711 Personal history of peptic ulcer disease: Secondary | ICD-10-CM | POA: Insufficient documentation

## 2016-10-08 DIAGNOSIS — R627 Adult failure to thrive: Secondary | ICD-10-CM | POA: Insufficient documentation

## 2016-10-08 DIAGNOSIS — I708 Atherosclerosis of other arteries: Secondary | ICD-10-CM | POA: Diagnosis not present

## 2016-10-08 DIAGNOSIS — M40209 Unspecified kyphosis, site unspecified: Secondary | ICD-10-CM | POA: Diagnosis not present

## 2016-10-08 DIAGNOSIS — I252 Old myocardial infarction: Secondary | ICD-10-CM | POA: Insufficient documentation

## 2016-10-08 DIAGNOSIS — R112 Nausea with vomiting, unspecified: Principal | ICD-10-CM | POA: Insufficient documentation

## 2016-10-08 DIAGNOSIS — M4854XA Collapsed vertebra, not elsewhere classified, thoracic region, initial encounter for fracture: Secondary | ICD-10-CM | POA: Insufficient documentation

## 2016-10-08 DIAGNOSIS — K5900 Constipation, unspecified: Secondary | ICD-10-CM

## 2016-10-08 DIAGNOSIS — K5730 Diverticulosis of large intestine without perforation or abscess without bleeding: Secondary | ICD-10-CM | POA: Insufficient documentation

## 2016-10-08 DIAGNOSIS — Z7982 Long term (current) use of aspirin: Secondary | ICD-10-CM | POA: Diagnosis not present

## 2016-10-08 DIAGNOSIS — I700 Atherosclerosis of aorta: Secondary | ICD-10-CM | POA: Insufficient documentation

## 2016-10-08 DIAGNOSIS — I509 Heart failure, unspecified: Secondary | ICD-10-CM | POA: Diagnosis not present

## 2016-10-08 DIAGNOSIS — Z66 Do not resuscitate: Secondary | ICD-10-CM | POA: Insufficient documentation

## 2016-10-08 DIAGNOSIS — N281 Cyst of kidney, acquired: Secondary | ICD-10-CM | POA: Diagnosis not present

## 2016-10-08 DIAGNOSIS — Z885 Allergy status to narcotic agent status: Secondary | ICD-10-CM | POA: Insufficient documentation

## 2016-10-08 DIAGNOSIS — I4891 Unspecified atrial fibrillation: Secondary | ICD-10-CM | POA: Diagnosis not present

## 2016-10-08 DIAGNOSIS — E785 Hyperlipidemia, unspecified: Secondary | ICD-10-CM | POA: Insufficient documentation

## 2016-10-08 DIAGNOSIS — I2510 Atherosclerotic heart disease of native coronary artery without angina pectoris: Secondary | ICD-10-CM | POA: Diagnosis not present

## 2016-10-08 DIAGNOSIS — Z880 Allergy status to penicillin: Secondary | ICD-10-CM | POA: Diagnosis not present

## 2016-10-08 DIAGNOSIS — Z951 Presence of aortocoronary bypass graft: Secondary | ICD-10-CM | POA: Insufficient documentation

## 2016-10-08 DIAGNOSIS — E1165 Type 2 diabetes mellitus with hyperglycemia: Secondary | ICD-10-CM | POA: Diagnosis not present

## 2016-10-08 DIAGNOSIS — R262 Difficulty in walking, not elsewhere classified: Secondary | ICD-10-CM

## 2016-10-08 DIAGNOSIS — Z6820 Body mass index (BMI) 20.0-20.9, adult: Secondary | ICD-10-CM | POA: Insufficient documentation

## 2016-10-08 DIAGNOSIS — Z952 Presence of prosthetic heart valve: Secondary | ICD-10-CM | POA: Insufficient documentation

## 2016-10-08 DIAGNOSIS — Z794 Long term (current) use of insulin: Secondary | ICD-10-CM | POA: Insufficient documentation

## 2016-10-08 DIAGNOSIS — Z7901 Long term (current) use of anticoagulants: Secondary | ICD-10-CM | POA: Insufficient documentation

## 2016-10-08 DIAGNOSIS — R1110 Vomiting, unspecified: Secondary | ICD-10-CM | POA: Diagnosis present

## 2016-10-08 DIAGNOSIS — I110 Hypertensive heart disease with heart failure: Secondary | ICD-10-CM | POA: Insufficient documentation

## 2016-10-08 LAB — CBC WITH DIFFERENTIAL/PLATELET
Basophils Absolute: 0.1 10*3/uL (ref 0–0.1)
Basophils Relative: 1 %
Eosinophils Absolute: 0.2 10*3/uL (ref 0–0.7)
Eosinophils Relative: 2 %
HCT: 30.9 % — ABNORMAL LOW (ref 35.0–47.0)
HEMOGLOBIN: 10.1 g/dL — AB (ref 12.0–16.0)
LYMPHS ABS: 0.6 10*3/uL — AB (ref 1.0–3.6)
LYMPHS PCT: 5 %
MCH: 28.7 pg (ref 26.0–34.0)
MCHC: 32.5 g/dL (ref 32.0–36.0)
MCV: 88.1 fL (ref 80.0–100.0)
Monocytes Absolute: 1 10*3/uL — ABNORMAL HIGH (ref 0.2–0.9)
Monocytes Relative: 9 %
NEUTROS ABS: 9.9 10*3/uL — AB (ref 1.4–6.5)
NEUTROS PCT: 83 %
Platelets: 293 10*3/uL (ref 150–440)
RBC: 3.51 MIL/uL — AB (ref 3.80–5.20)
RDW: 14.4 % (ref 11.5–14.5)
WBC: 11.7 10*3/uL — AB (ref 3.6–11.0)

## 2016-10-08 LAB — COMPREHENSIVE METABOLIC PANEL
ALK PHOS: 49 U/L (ref 38–126)
ALT: 16 U/L (ref 14–54)
AST: 28 U/L (ref 15–41)
Albumin: 3.6 g/dL (ref 3.5–5.0)
Anion gap: 6 (ref 5–15)
BUN: 23 mg/dL — AB (ref 6–20)
CALCIUM: 10.1 mg/dL (ref 8.9–10.3)
CO2: 30 mmol/L (ref 22–32)
CREATININE: 1.01 mg/dL — AB (ref 0.44–1.00)
Chloride: 100 mmol/L — ABNORMAL LOW (ref 101–111)
GFR, EST AFRICAN AMERICAN: 55 mL/min — AB (ref >60)
GFR, EST NON AFRICAN AMERICAN: 48 mL/min — AB (ref >60)
Glucose, Bld: 209 mg/dL — ABNORMAL HIGH (ref 65–99)
Potassium: 3.7 mmol/L (ref 3.5–5.1)
Sodium: 136 mmol/L (ref 135–145)
Total Bilirubin: 0.2 mg/dL — ABNORMAL LOW (ref 0.3–1.2)
Total Protein: 6.9 g/dL (ref 6.5–8.1)

## 2016-10-08 LAB — URINALYSIS, COMPLETE (UACMP) WITH MICROSCOPIC
Bacteria, UA: NONE SEEN
Bilirubin Urine: NEGATIVE
Glucose, UA: 50 mg/dL — AB
Hgb urine dipstick: NEGATIVE
Ketones, ur: NEGATIVE mg/dL
Leukocytes, UA: NEGATIVE
Nitrite: NEGATIVE
PH: 7 (ref 5.0–8.0)
Protein, ur: NEGATIVE mg/dL
SPECIFIC GRAVITY, URINE: 1.013 (ref 1.005–1.030)

## 2016-10-08 LAB — GLUCOSE, CAPILLARY
GLUCOSE-CAPILLARY: 175 mg/dL — AB (ref 65–99)
Glucose-Capillary: 123 mg/dL — ABNORMAL HIGH (ref 65–99)

## 2016-10-08 LAB — TROPONIN I

## 2016-10-08 LAB — PROTIME-INR
INR: 1.1
Prothrombin Time: 14.2 seconds (ref 11.4–15.2)

## 2016-10-08 LAB — LIPASE, BLOOD: LIPASE: 26 U/L (ref 11–51)

## 2016-10-08 LAB — PROCALCITONIN

## 2016-10-08 LAB — LACTIC ACID, PLASMA: LACTIC ACID, VENOUS: 1 mmol/L (ref 0.5–1.9)

## 2016-10-08 MED ORDER — ROSUVASTATIN CALCIUM 20 MG PO TABS
40.0000 mg | ORAL_TABLET | Freq: Every day | ORAL | Status: DC
  Administered 2016-10-08 – 2016-10-10 (×3): 40 mg via ORAL
  Filled 2016-10-08 (×3): qty 2

## 2016-10-08 MED ORDER — ENOXAPARIN SODIUM 60 MG/0.6ML ~~LOC~~ SOLN
1.0000 mg/kg | SUBCUTANEOUS | Status: DC
  Administered 2016-10-08: 55 mg via SUBCUTANEOUS
  Filled 2016-10-08 (×2): qty 0.6

## 2016-10-08 MED ORDER — VITAMIN D 1000 UNITS PO TABS
5000.0000 [IU] | ORAL_TABLET | Freq: Every day | ORAL | Status: DC
  Administered 2016-10-09 – 2016-10-10 (×2): 5000 [IU] via ORAL
  Filled 2016-10-08 (×2): qty 5

## 2016-10-08 MED ORDER — POLYETHYLENE GLYCOL 3350 17 GM/SCOOP PO POWD
17.0000 g | Freq: Every day | ORAL | Status: DC
  Filled 2016-10-08: qty 255

## 2016-10-08 MED ORDER — ACETAMINOPHEN 325 MG PO TABS: 650.0000 mg | ORAL_TABLET | Freq: Four times a day (QID) | ORAL | Status: DC | PRN

## 2016-10-08 MED ORDER — BENAZEPRIL HCL 20 MG PO TABS
20.0000 mg | ORAL_TABLET | Freq: Every day | ORAL | Status: DC
  Administered 2016-10-08 – 2016-10-10 (×3): 20 mg via ORAL
  Filled 2016-10-08 (×3): qty 1

## 2016-10-08 MED ORDER — CEFEPIME-DEXTROSE 1 GM/50ML IV SOLR: 1.0000 g | Freq: Once | INTRAVENOUS | Status: DC

## 2016-10-08 MED ORDER — IOPAMIDOL (ISOVUE-300) INJECTION 61%: 15.0000 mL | INTRAVENOUS | Status: DC

## 2016-10-08 MED ORDER — SODIUM CHLORIDE 0.9 % IV SOLN
Freq: Once | INTRAVENOUS | Status: AC
  Administered 2016-10-08: 18:00:00 via INTRAVENOUS

## 2016-10-08 MED ORDER — TRAMADOL HCL 50 MG PO TABS: 50.0000 mg | ORAL_TABLET | Freq: Four times a day (QID) | ORAL | Status: DC | PRN

## 2016-10-08 MED ORDER — POLYETHYLENE GLYCOL 3350 17 G PO PACK
17.0000 g | PACK | Freq: Every day | ORAL | Status: DC
  Administered 2016-10-10: 17 g via ORAL
  Filled 2016-10-08: qty 1

## 2016-10-08 MED ORDER — SODIUM CHLORIDE 0.9 % IV BOLUS (SEPSIS)
1000.0000 mL | Freq: Once | INTRAVENOUS | Status: AC
  Administered 2016-10-08: 1000 mL via INTRAVENOUS

## 2016-10-08 MED ORDER — PROMETHAZINE HCL 25 MG/ML IJ SOLN
6.2500 mg | Freq: Once | INTRAMUSCULAR | Status: AC
  Administered 2016-10-08: 6.25 mg via INTRAVENOUS
  Filled 2016-10-08: qty 1

## 2016-10-08 MED ORDER — AMLODIPINE BESYLATE 5 MG PO TABS
5.0000 mg | ORAL_TABLET | Freq: Every day | ORAL | Status: DC
  Administered 2016-10-08 – 2016-10-10 (×3): 5 mg via ORAL
  Filled 2016-10-08 (×3): qty 1

## 2016-10-08 MED ORDER — IOPAMIDOL (ISOVUE-370) INJECTION 76%
75.0000 mL | Freq: Once | INTRAVENOUS | Status: AC | PRN
  Administered 2016-10-08: 75 mL via INTRAVENOUS

## 2016-10-08 MED ORDER — EZETIMIBE 10 MG PO TABS
10.0000 mg | ORAL_TABLET | Freq: Every day | ORAL | Status: DC
  Administered 2016-10-08 – 2016-10-10 (×3): 10 mg via ORAL
  Filled 2016-10-08 (×3): qty 1

## 2016-10-08 MED ORDER — CHOLECALCIFEROL 125 MCG (5000 UT) PO CAPS
5000.0000 [IU] | ORAL_CAPSULE | Freq: Every day | ORAL | Status: DC
  Filled 2016-10-08: qty 1

## 2016-10-08 MED ORDER — OMEPRAZOLE MAGNESIUM 20 MG PO TBEC: 20.0000 mg | DELAYED_RELEASE_TABLET | Freq: Two times a day (BID) | ORAL | Status: DC

## 2016-10-08 MED ORDER — LORATADINE 10 MG PO TABS
10.0000 mg | ORAL_TABLET | Freq: Every day | ORAL | Status: DC
  Administered 2016-10-08 – 2016-10-10 (×3): 10 mg via ORAL
  Filled 2016-10-08 (×3): qty 1

## 2016-10-08 MED ORDER — ASPIRIN EC 81 MG PO TBEC
81.0000 mg | DELAYED_RELEASE_TABLET | Freq: Every day | ORAL | Status: DC
  Administered 2016-10-08 – 2016-10-10 (×3): 81 mg via ORAL
  Filled 2016-10-08 (×6): qty 1

## 2016-10-08 MED ORDER — WARFARIN SODIUM 2 MG PO TABS
8.0000 mg | ORAL_TABLET | Freq: Once | ORAL | Status: AC
  Administered 2016-10-08: 8 mg via ORAL
  Filled 2016-10-08: qty 4
  Filled 2016-10-08: qty 2

## 2016-10-08 MED ORDER — LEVOTHYROXINE SODIUM 25 MCG PO TABS
25.0000 ug | ORAL_TABLET | Freq: Every day | ORAL | Status: DC
  Administered 2016-10-09 – 2016-10-10 (×2): 25 ug via ORAL
  Filled 2016-10-08 (×2): qty 1

## 2016-10-08 MED ORDER — VANCOMYCIN HCL IN DEXTROSE 1-5 GM/200ML-% IV SOLN: 1000.0000 mg | Freq: Once | INTRAVENOUS | Status: DC

## 2016-10-08 MED ORDER — INSULIN GLARGINE 100 UNIT/ML ~~LOC~~ SOLN
15.0000 [IU] | Freq: Every day | SUBCUTANEOUS | Status: DC
Start: 2016-10-08 — End: 2016-10-10
  Administered 2016-10-08 – 2016-10-10 (×3): 15 [IU] via SUBCUTANEOUS
  Filled 2016-10-08 (×3): qty 0.15

## 2016-10-08 MED ORDER — PANTOPRAZOLE SODIUM 40 MG PO TBEC
40.0000 mg | DELAYED_RELEASE_TABLET | Freq: Every day | ORAL | Status: DC
  Administered 2016-10-08 – 2016-10-10 (×3): 40 mg via ORAL
  Filled 2016-10-08 (×3): qty 1

## 2016-10-08 MED ORDER — INSULIN ASPART 100 UNIT/ML ~~LOC~~ SOLN
0.0000 [IU] | Freq: Three times a day (TID) | SUBCUTANEOUS | Status: DC
  Administered 2016-10-08: 2 [IU] via SUBCUTANEOUS
  Administered 2016-10-09: 8 [IU] via SUBCUTANEOUS
  Administered 2016-10-10: 2 [IU] via SUBCUTANEOUS
  Filled 2016-10-08: qty 8
  Filled 2016-10-08 (×2): qty 2

## 2016-10-08 MED ORDER — ISOSORBIDE MONONITRATE ER 30 MG PO TB24
15.0000 mg | ORAL_TABLET | Freq: Every day | ORAL | Status: DC
  Administered 2016-10-08 – 2016-10-10 (×3): 15 mg via ORAL
  Filled 2016-10-08 (×3): qty 1

## 2016-10-08 MED ORDER — WARFARIN - PHARMACIST DOSING INPATIENT
Freq: Every day | Status: DC
  Administered 2016-10-08: 18:00:00
  Filled 2016-10-08 (×2): qty 1

## 2016-10-08 MED ORDER — KETOROLAC TROMETHAMINE 10 MG PO TABS
10.0000 mg | ORAL_TABLET | Freq: Four times a day (QID) | ORAL | Status: DC | PRN
  Filled 2016-10-08: qty 1

## 2016-10-08 MED ORDER — ACETAMINOPHEN 650 MG RE SUPP: 650.0000 mg | Freq: Four times a day (QID) | RECTAL | Status: DC | PRN

## 2016-10-08 NOTE — Progress Notes (Signed)
Family Meeting Note  Advance Directive yes  Today a meeting took place with the patient's nephew Amanda [name]  The following clinical team members were present during this meeting: Patient and his nephew The following were discussed:Patient's diagnosis: , Patient's progosis: Patient is being admitted for vomiting and poor by mouth intake and failure to thrive. Recently had EGD done for a sebaceous stenosis. Patient has been having intermittent episodes of vomiting more so since yesterday. Discussed with the nephew patient will be admitted for IV hydration and speech therapy and dietitian to see for poor by mouth intake Consider palliative care consult if nephew agreeable Patient is a DO NOT RESUSCITATE she carries out of facility form   Time spent during discussion: 20 minutes Amanda Firkus, MD

## 2016-10-08 NOTE — ED Provider Notes (Signed)
[facility]-EMERGENCY DEPARTMENT Provider Note   CSN: [id number] Arrival date & time: 10/08/16  2130     History   Chief Complaint Chief Complaint  Patient presents with  . Weakness  . Nausea    HPI Amanda [name] is a 82 y.o. female hx of CAD, DM, HTN, HL, afib on coumadin here cough, weakness, nausea, vomiting. As per the nephew, patient was recently diagnosed with pneumonia and finished a course of antibiotic about a week ago. Patient also had endoscopy a week ago for some trouble swallowing and cystoscopy just show some scar tissue on the distal esophagus and no stomach ulcers. Since endoscopy, patient has been having poor appetite. Had several episodes of vomiting this morning. For the nephew, patient seemed more confused today and has been coughing. Blood sugar has been running elevated and was 215 this morning. She has been chronically constipated but denies any abdominal pain. Her INR was also subtherapeutic recently.   The history is provided by the patient and a relative.    Past Medical History:  Diagnosis Date  . CHF (congestive heart failure) (HCC)   . Coronary artery disease   . Diabetes mellitus without complication (HCC)   . Hyperlipidemia   . Hypertension   . Mitral valve disease   . NSTEMI (non-ST elevated myocardial infarction) (HCC) 01/15/2014    Patient Active Problem List   Diagnosis Date Noted  . SOB (shortness of breath) 04/04/2016  . Abdominal pain 08/14/2015  . Nausea 02/22/2015  . Mitral valve disorder 02/14/2014  . Encounter for therapeutic drug monitoring 02/14/2014  . Anemia 01/21/2014  . Protein-calorie malnutrition, severe (HCC) 01/19/2014  . NSTEMI (non-ST elevated myocardial infarction) (HCC) 01/15/2014  . Diabetes mellitus (HCC) 05/29/2012  . Hypertension 05/29/2012  . Hyperlipidemia 04/05/2010  . CAD, ARTERY BYPASS GRAFT, 2004, LIMA-LAD; VG-RCA; VG-LCX 04/05/2010  . MITRAL VALVE REPLACEMENT, HX OF, 2004, 27 mmmechanical valve 12/27/2009     Past Surgical History:  Procedure Laterality Date  . CARDIAC CATHETERIZATION    . CORONARY ARTERY BYPASS GRAFT    . LEFT HEART CATHETERIZATION WITH CORONARY ANGIOGRAM N/A 01/17/2014   Procedure: LEFT HEART CATHETERIZATION WITH CORONARY ANGIOGRAM;  Surgeon: Lennette Bihari, MD;  Location: [facility] CATH LAB;  Service: Cardiovascular;  Laterality: N/A;  . MITRAL VALVE REPLACEMENT      OB History    No data available       Home Medications    Prior to Admission medications   Medication Sig Start Date End Date Taking? Authorizing Provider  amLODipine (NORVASC) 5 MG tablet Take 5 mg by mouth daily.  02/21/15  Yes Historical Provider, MD  aspirin 81 MG EC tablet Take 81 mg by mouth daily.     Yes Historical Provider, MD  benazepril (LOTENSIN) 20 MG tablet Take 20 mg by mouth daily.  02/21/15  Yes Historical Provider, MD  Cholecalciferol (D-3-5) 5000 UNITS capsule Take 5,000 Units by mouth daily.   Yes Historical Provider, MD  furosemide (LASIX) 20 MG tablet Take 1 tablet (20 mg total) by mouth daily as needed. 04/04/16  Yes Antonieta Iba, MD  guaiFENesin (MUCINEX) 600 MG 12 hr tablet Take 600 mg by mouth 2 (two) times daily as needed for cough or to loosen phlegm.   Yes Historical Provider, MD  insulin detemir (LEVEMIR) 100 UNIT/ML injection Inject 11-14 Units into the skin daily. Patient currently taking 11 units per day, scheduled to increase 3 units on Friday 10/11/16 until further testing by primary physician.   Yes  Historical Provider, MD  insulin lispro (HUMALOG) 100 UNIT/ML injection Inject 4-10 Units into the skin 3 (three) times daily with meals. Patient is using the following sliding scale:  201-250= 4 units 251-300= 6 units 301-350= 8 units 351-400= 10 units   Yes Historical Provider, MD  isosorbide mononitrate (IMDUR) 30 MG 24 hr tablet TAKE 1/2 TABLET BY MOUTH DAILY 08/09/16  Yes Antonieta Iba, MD  levothyroxine (SYNTHROID, LEVOTHROID) 25 MCG tablet Take 25 mcg by mouth daily  before breakfast.   Yes Historical Provider, MD  loratadine (CLARITIN) 10 MG tablet Take 10 mg by mouth daily.   Yes Historical Provider, MD  metoprolol succinate (TOPROL-XL) 50 MG 24 hr tablet TAKE 1 1/2 TABLETS BY MOUTH DAILY. TAKE WITH OR IMMEDIATELY FOLLOWING A MEAL 02/06/16  Yes Antonieta Iba, MD  omeprazole (PRILOSEC OTC) 20 MG tablet Take 20 mg by mouth 2 (two) times daily.   Yes Historical Provider, MD  polyethylene glycol powder (GLYCOLAX/MIRALAX) powder Take 17 g by mouth daily.   Yes Historical Provider, MD  rosuvastatin (CRESTOR) 40 MG tablet TAKE 1 TABLET(40 MG) BY MOUTH AT BEDTIME 08/08/16  Yes Antonieta Iba, MD  VICTOZA 18 MG/3ML SOPN Inject 1.8 mg into the skin at bedtime. 10/04/16  Yes Historical Provider, MD  warfarin (COUMADIN) 6 MG tablet Take as directed by [facility] 05/22/16  Yes Antonieta Iba, MD  ZETIA 10 MG tablet TAKE 1 TABLET BY MOUTH DAILY 08/15/16  Yes Antonieta Iba, MD  enoxaparin (LOVENOX) 60 MG/0.6ML injection Inject 60 mg into the skin.    Historical Provider, MD  ketorolac (TORADOL) 10 MG tablet Take 10 mg by mouth every 6 (six) hours as needed.    Historical Provider, MD  nitroGLYCERIN (NITROSTAT) 0.4 MG SL tablet Place 1 tablet (0.4 mg total) under the tongue every 5 (five) minutes as needed for chest pain. 05/01/16   Antonieta Iba, MD  potassium chloride (K-DUR) 10 MEQ tablet Take 1 tablet (10 mEq total) by mouth daily as needed. 04/04/16   Antonieta Iba, MD    Family History Family History  Problem Relation Age of Onset  . Heart disease Mother   . Coronary artery disease Other   . Diabetes Other   . Stroke Other     Social History Social History  Substance Use Topics  . Smoking status: Never Smoker  . Smokeless tobacco: Current User    Types: Snuff  . Alcohol use No     Allergies   Codeine; Penicillins; and Tramadol   Review of Systems Review of Systems  Respiratory: Positive for cough.   Gastrointestinal: Positive for  vomiting.  Neurological: Positive for weakness.  All other systems reviewed and are negative.    Physical Exam Updated Vital Signs BP (!) 142/89   Pulse 98   Temp 98.2 F (36.8 C) (Rectal)   Resp (!) 21   Ht 5\' 3"  (1.6 m)   Wt 116 lb (52.6 kg)   SpO2 99%   BMI 20.55 kg/m   Physical Exam  Constitutional:  Chronically ill, dehydrated   HENT:  Head: Normocephalic.  MM dry. Dry blood L ear canal (noticed on recent ED visit)   Eyes: EOM are normal. Pupils are equal, round, and reactive to light.  Neck: Normal range of motion. Neck supple.  Cardiovascular: Normal rate, regular rhythm and normal heart sounds.   Pulmonary/Chest:  Slightly tachypneic, + crackles R base   Abdominal: Soft. Bowel sounds are normal. She exhibits  no distension. There is no tenderness.  Musculoskeletal: Normal range of motion.  Neurological:  Alert. Moving all extremities   Skin: Skin is warm.  Nursing note and vitals reviewed.    ED Treatments / Results  Labs (all labs ordered are listed, but only abnormal results are displayed) Labs Reviewed  CBC WITH DIFFERENTIAL/PLATELET - Abnormal; Notable for the following:       Result Value   WBC 11.7 (*)    RBC 3.51 (*)    Hemoglobin 10.1 (*)    HCT 30.9 (*)    Neutro Abs 9.9 (*)    Lymphs Abs 0.6 (*)    Monocytes Absolute 1.0 (*)    All other components within normal limits  COMPREHENSIVE METABOLIC PANEL - Abnormal; Notable for the following:    Chloride 100 (*)    Glucose, Bld 209 (*)    BUN 23 (*)    Creatinine, Ser 1.01 (*)    Total Bilirubin 0.2 (*)    GFR calc non Af Amer 48 (*)    GFR calc Af Amer 55 (*)    All other components within normal limits  URINALYSIS, COMPLETE (UACMP) WITH MICROSCOPIC - Abnormal; Notable for the following:    Color, Urine YELLOW (*)    APPearance CLEAR (*)    Glucose, UA 50 (*)    Squamous Epithelial / LPF 0-5 (*)    All other components within normal limits  URINE CULTURE  CULTURE, BLOOD (ROUTINE X 2)    CULTURE, BLOOD (ROUTINE X 2)  TROPONIN I  LIPASE, BLOOD  PROTIME-INR  LACTIC ACID, PLASMA  PROCALCITONIN    EKG  EKG Interpretation  Date/Time:  Tuesday October 08 2016 10:06:32 EST Ventricular Rate:  98 PR Interval:    QRS Duration: 135 QT Interval:  400 QTC Calculation: 511 R Axis:   24 Text Interpretation:  Sinus rhythm Atrial premature complex Right bundle branch block Confirmed by UNCONFIRMED, DOCTOR ([id number]), editor [place], TAMMY [phone]) on 10/08/2016 2:45:10 PM       Radiology Dg Chest 2 View  Result Date: 10/08/2016 CLINICAL DATA:  Nausea, vomiting, shortness of breath and weakness for 1 week. EXAM: CHEST  2 VIEW COMPARISON:  Single-view of the chest 09/09/2016 FINDINGS: There is a small left pleural effusion. No right effusion. Lungs appear clear. Heart size is enlarged. The patient is status post CABG. Aortic atherosclerosis is noted. The patient has lower thoracic compression fractures which appear remote. IMPRESSION: Small left pleural effusion. Cardiomegaly without edema. Atherosclerosis. Electronically Signed   By: Drusilla Kanner M.D.   On: 10/08/2016 10:54   Ct Angio Chest Pe W And/or Wo Contrast  Result Date: 10/08/2016 CLINICAL DATA:  Shortness of breath.  Abdominal pain. EXAM: CT ANGIOGRAPHY CHEST CT ABDOMEN AND PELVIS WITH CONTRAST TECHNIQUE: Multidetector CT imaging of the chest was performed using the standard protocol during bolus administration of intravenous contrast. Multiplanar CT image reconstructions and MIPs were obtained to evaluate the vascular anatomy. Multidetector CT imaging of the abdomen and pelvis was performed using the standard protocol during bolus administration of intravenous contrast. CONTRAST:  75 mL Isovue 370 nonionic COMPARISON:  Chest radiograph October 08, 2016 FINDINGS: CTA CHEST FINDINGS Cardiovascular: There is no demonstrable pulmonary embolus. There is no appreciable thoracic aortic aneurysm or dissection. There is  atherosclerotic calcification in the proximal visualized great vessels. Visualized great vessels otherwise appear unremarkable. There are scattered foci of atherosclerotic calcification in the aorta. There also foci of coronary artery calcification. There is a prosthetic mitral  valve. There is mild left ventricular hypertrophy. The pericardium is not appreciably thickened. Mediastinum/Nodes: Visualized thyroid appears normal. There are scattered subcentimeter mediastinal lymph nodes but no demonstrable adenopathy by size criteria. Lungs/Pleura: There is a degree of underlying centrilobular emphysematous change. There is a small focus of airspace consolidation in the medial segment of the left lower lobe which abuts the middle mediastinum. These focal consolidation in the posterior segment of right lower lobe with questionable associated rounded atelectasis. There is mild scarring in both posterior lung bases. There is no pleural effusion or pleural thickening evident. Musculoskeletal: There is thoracic dextroscoliosis. There is increase in kyphosis. There is anterior wedging in several lower thoracic vertebral bodies, with the greatest degree of wedging at T11 and T12. There are no blastic or lytic bone lesions. Review of the MIP images confirms the above findings. CT ABDOMEN and PELVIS FINDINGS Hepatobiliary: No focal liver lesions are appreciable. Gallbladder wall is not appreciably thickened. There is no biliary duct dilatation. Pancreas: There is no pancreatic mass or inflammatory focus. Spleen: No splenic lesions are evident. Adrenals/Urinary Tract: Adrenals appear unremarkable bilaterally. There are subcentimeter cysts in the right kidney. In the left kidney, there is a cyst arising from the upper pole region measuring 2.9 x 2.1 cm. A cyst in the lateral mid left kidney measures 1.3 x 1.1 cm. There is scarring in the lateral upper to mid left kidney. There is no renal or ureteral calculus on either side.  Urinary bladder is midline with wall thickness within normal limits. Stomach/Bowel: There are multiple sigmoid diverticula without diverticulitis. There is no bowel wall or mesenteric thickening. No bowel obstruction. No free air or portal venous air. There is no bowel pneumatosis. Vascular/Lymphatic: There is atherosclerotic calcification in the aorta and iliac arteries without aneurysm. There is extensive calcification at the origins of the major mesenteric arteries. No adenopathy is evident in the abdomen or pelvis. Reproductive: Uterus is anteverted. There is no pelvic mass or pelvic fluid collection. Other: Appendix appears normal. There is no ascites or abscess in the abdomen pelvis. Musculoskeletal: Bones are osteoporotic. There are anterior wedge compression fractures in the lumbar region the L1 and L3. There is inferior endplate compression at L4. There are no blastic or lytic bone lesions. There is no intramuscular or abdominal wall lesion. Review of the MIP images confirms the above findings. IMPRESSION: CT angiogram chest: No demonstrable pulmonary embolus. Foci of coronary artery calcification noted as well as atherosclerotic calcification in the aorta. No thoracic aortic aneurysm or dissection. Patient is status post mitral valve replacement. There is focal infiltrate in the medial segment of the left lower lobe abutting the middle mediastinum. Small focus of infiltrate with round atelectasis posterior right base. Underlying centrilobular emphysematous change. No adenopathy. Increased kyphosis with several thoracic compression fractures. CT abdomen and pelvis: Extensive aortoiliac atherosclerosis. Extensive mesenteric arterial calcification noted. No bowel pneumatosis or bowel wall thickening to suggest bowel ischemia. No bowel obstruction.  No abscess.  Appendix appears normal. No renal or ureteral calculus.  No hydronephrosis. Bones osteoporotic with several compression fractures in the lumbar  region. Electronically Signed   By: William  WoBretta Bangodruff III M.D.   On: 10/08/2016 13:47   Ct Abdomen Pelvis W Contrast  Result Date: 10/08/2016 : CT angiogram chest as well as CT abdomen and pelvis reports are combined into a single dictation. Electronically Signed   By: Bretta BangWilliam  Woodruff III M.D.   On: 10/08/2016 13:49   Dg Abd 2 Views  Result Date: 10/08/2016  CLINICAL DATA:  Nausea, vomiting. EXAM: ABDOMEN - 2 VIEW COMPARISON:  Radiograph of December 21, 2003. FINDINGS: The bowel gas pattern is normal. There is no evidence of free air. No radio-opaque calculi or other significant radiographic abnormality is seen. IMPRESSION: No evidence of bowel obstruction or ileus. Electronically Signed   By: Lupita RaiderJames  Green Jr, M.D.   On: 10/08/2016 10:51    Procedures Procedures (including critical care time)  ED ECG REPORT I, Richardean Canalavid H Sireen Halk, the attending physician, personally viewed and interpreted this ECG.   Date: 10/08/2016  EKG Time: 10:06 am  Rate: 98  Rhythm: normal EKG, normal sinus rhythm  Axis: normal  Intervals:right bundle branch block  ST&T Change: nonspecific    Medications Ordered in ED Medications  sodium chloride 0.9 % bolus 1,000 mL (0 mLs Intravenous Stopped 10/08/16 1408)  iopamidol (ISOVUE-370) 76 % injection 75 mL (75 mLs Intravenous Contrast Given 10/08/16 1202)  promethazine (PHENERGAN) injection 6.25 mg (6.25 mg Intravenous Given 10/08/16 1419)     Initial Impression / Assessment and Plan / ED Course  I have reviewed the triage vital signs and the nursing notes.  Pertinent labs & imaging results that were available during my care of the patient were reviewed by me and considered in my medical decision making (see chart for details).  Clinical Course    Amanda [name] is a 82 y.o. female here with weakness, cough, vomiting, hyperglycemia. Concerned for possible recurrent pneumonia vs UTI. Will do sepsis workup. Will hydrate patient.  3:10 PM UA showed no obvious UTI.  WBC 11.7. CXR showed pleural effusion. CT chest/ab/pel showed possible pneumonia. Ordered abx but talked to Dr. Allena KatzPatel from hospitalist, who wants lactate and procalcitonin to decide if patient had aspiration event vs pneumonia. Lactate, procalcitonin neg. Will hold off on abx. Still nauseated and unable to tolerate PO despite phenergan and zofran (given by EMS). Given IVF. Will admit for intractable vomiting, dehydration.     Final Clinical Impressions(s) / ED Diagnoses   Final diagnoses:  Constipation    New Prescriptions New Prescriptions   No medications on file     Charlynne Panderavid Hsienta Tirrell Buchberger, MD 10/08/16 1512

## 2016-10-08 NOTE — ED Notes (Signed)
Pt taken to CT via stretcher.

## 2016-10-08 NOTE — ED Notes (Signed)
Py moved to side, lying on stretcher. Noticed HR increased to 120. Went into room and checked temperature, afebrile.

## 2016-10-08 NOTE — ED Notes (Signed)
Patient transported to X-ray 

## 2016-10-08 NOTE — ED Notes (Signed)
Pt assisted to the bathroom w/o incident and pt given another warm blanket.

## 2016-10-08 NOTE — ED Notes (Signed)
Pt called out stating she needed to urinate, placed on bedpan. Tolerated well. Pt cleaned and new brief placed.

## 2016-10-08 NOTE — Progress Notes (Signed)
ANTICOAGULATION CONSULT NOTE - Initial Consult  Pharmacy Consult for warfarin Indication: mechanical mitral valve  Allergies  Allergen Reactions  . Codeine     REACTION: makes her "crazy"  . Penicillins Hives  . Tramadol     Hallucinations    Patient Measurements: Height: 5\' 3"  (160 cm) Weight: 116 lb (52.6 kg) IBW/kg (Calculated) : 52.4   Vital Signs: Temp: 97.8 F (36.6 C) (12/19 1518) Temp Source: Oral (12/19 1518) BP: 142/85 (12/19 1500) Pulse Rate: 111 (12/19 1518)  Labs:  Recent Labs  10/07/16 10/08/16 0957  HGB  --  10.1*  HCT  --  30.9*  PLT  --  293  LABPROT  --  14.2  INR 1.1 1.10  CREATININE  --  1.01*  TROPONINI  --  <0.03    Estimated Creatinine Clearance: 31.2 mL/min (by C-G formula based on SCr of 1.01 mg/dL (H)).   Medical History: Past Medical History:  Diagnosis Date  . CHF (congestive heart failure) (HCC)   . Coronary artery disease   . Diabetes mellitus without complication (HCC)   . Hyperlipidemia   . Hypertension   . Mitral valve disease   . NSTEMI (non-ST elevated myocardial infarction) (HCC) 01/15/2014    Assessment: Patient currently on warfarin/enoxparin bridge as outpatient following a procedure. Taking warfarin 3mg  on Sunday, Tuesday, Thursday, and Saturday 6 mg on Monday, Wednesday, and Friday (TWD:30mg ) and enoxaparin 60mg  daily. MD would like to continue current enoxaparin dosing INR remains subtherapeutic.   Goal of Therapy:  INR: 2.5-3.5   Plan:  INR subtherapeutic on admission. Will give warfarin 8mg  x 1 tonight. Plan to increase total weekly dose by ~20% to 5mg  daily.  Continue enoxaparin 55mg  SQ Q24H. Recheck INR with AM   Bobak Oguinn C 10/08/2016,4:01 PM

## 2016-10-08 NOTE — ED Notes (Signed)
Pt sleeping on stretcher, family at bedside. Waiting on CT results. Family verbalized understanding.

## 2016-10-08 NOTE — ED Triage Notes (Signed)
Pt in via [facility] from home with complaints of nausea/vomiting and generalized weakness.  Pt with recent hospitalization due to PNA and then to rehab facility x 8 days.  Pt with endoscopy x 1 week ago showing scar tissue, pt continues with swallowing difficulty, nausea, vomiting since then.  Pt A/Ox4, no immediate distress noted at this time.

## 2016-10-08 NOTE — H&P (Signed)
[facility] [place] [facility]   PATIENT NAME: Amanda [name]    MR#:  [id number]  DATE OF BIRTH:  09-22-1925  DATE OF ADMISSION:  10/08/2016  PRIMARY CARE PHYSICIAN: Junius Roads, MD   REQUESTING/REFERRING PHYSICIAN: Dr Silverio Lay  CHIEF COMPLAINT:  Vomiting since yesterday  HISTORY OF PRESENT ILLNESS:  Amanda [name]  is a 82 y.o. female with a known history of GERD, peptic ulcer disease, CAD, hypertension, uncontrolled type 2 diabetes, status post mitral valve replacement on chronic anticoagulation with Coumadin who was recently admitted at an outlying facility for EGD and was found to have aesophageal stricture underwent dilatation. Patient was discharged and then ended up having episode of pneumonia for which she to antibiotic and finished it about 2 weeks ago. Patient lives at home with his nephew Mr. Mitzi Hansen who brings her here because she has had episode of vomiting 2 yesterday and 1 today. The nephew fields patient is not able to tolerate oral intake and is getting dehydrated. In the emergency room patient has denies any nausea. She was given some nausea medicine earlier. She has no episode of vomiting she is hemodynamically otherwise stable. She denies any pain. Per patient's nephew her INR is still not therapeutic and she is getting Lovenox 60 mg subcutaneous shot daily and her cardiologist office is managing her Coumadin for which she is on 6 mg daily dose for now. Patient is being admitted for poor by mouth intake/failure to thrive/vomiting  PAST MEDICAL HISTORY:   Past Medical History:  Diagnosis Date  . CHF (congestive heart failure) (HCC)   . Coronary artery disease   . Diabetes mellitus without complication (HCC)   . Hyperlipidemia   . Hypertension   . Mitral valve disease   . NSTEMI (non-ST elevated myocardial infarction) (HCC) 01/15/2014    PAST SURGICAL HISTOIRY:   Past Surgical History:  Procedure Laterality Date  . CARDIAC  CATHETERIZATION    . CORONARY ARTERY BYPASS GRAFT    . LEFT HEART CATHETERIZATION WITH CORONARY ANGIOGRAM N/A 01/17/2014   Procedure: LEFT HEART CATHETERIZATION WITH CORONARY ANGIOGRAM;  Surgeon: Lennette Bihari, MD;  Location: [facility] CATH LAB;  Service: Cardiovascular;  Laterality: N/A;  . MITRAL VALVE REPLACEMENT      SOCIAL HISTORY:   Social History  Substance Use Topics  . Smoking status: Never Smoker  . Smokeless tobacco: Current User    Types: Snuff  . Alcohol use No    FAMILY HISTORY:   Family History  Problem Relation Age of Onset  . Heart disease Mother   . Coronary artery disease Other   . Diabetes Other   . Stroke Other     DRUG ALLERGIES:   Allergies  Allergen Reactions  . Codeine     REACTION: makes her "crazy"  . Penicillins Hives  . Tramadol     Hallucinations    REVIEW OF SYSTEMS:  Review of Systems  Constitutional: Negative for chills, fever and weight loss.  HENT: Negative for ear discharge, ear pain and nosebleeds.   Eyes: Negative for blurred vision, pain and discharge.  Respiratory: Negative for sputum production, shortness of breath, wheezing and stridor.   Cardiovascular: Negative for chest pain, palpitations, orthopnea and PND.  Gastrointestinal: Negative for abdominal pain, diarrhea, nausea and vomiting.  Genitourinary: Negative for frequency and urgency.  Musculoskeletal: Negative for back pain and joint pain.  Neurological: Positive for weakness. Negative for sensory change, speech change and focal weakness.  Psychiatric/Behavioral: Negative for depression and hallucinations.  The patient is not nervous/anxious.      MEDICATIONS AT HOME:   Prior to Admission medications   Medication Sig Start Date End Date Taking? Authorizing Provider  amLODipine (NORVASC) 5 MG tablet Take 5 mg by mouth daily.  02/21/15  Yes Historical Provider, MD  aspirin 81 MG EC tablet Take 81 mg by mouth daily.     Yes Historical Provider, MD  benazepril (LOTENSIN) 20  MG tablet Take 20 mg by mouth daily.  02/21/15  Yes Historical Provider, MD  Cholecalciferol (D-3-5) 5000 UNITS capsule Take 5,000 Units by mouth daily.   Yes Historical Provider, MD  furosemide (LASIX) 20 MG tablet Take 1 tablet (20 mg total) by mouth daily as needed. 04/04/16  Yes Antonieta Ibaimothy J Gollan, MD  guaiFENesin (MUCINEX) 600 MG 12 hr tablet Take 600 mg by mouth 2 (two) times daily as needed for cough or to loosen phlegm.   Yes Historical Provider, MD  insulin detemir (LEVEMIR) 100 UNIT/ML injection Inject 11-14 Units into the skin daily. Patient currently taking 11 units per day, scheduled to increase 3 units on Friday 10/11/16 until further testing by primary physician.   Yes Historical Provider, MD  insulin lispro (HUMALOG) 100 UNIT/ML injection Inject 4-10 Units into the skin 3 (three) times daily with meals. Patient is using the following sliding scale:  201-250= 4 units 251-300= 6 units 301-350= 8 units 351-400= 10 units   Yes Historical Provider, MD  isosorbide mononitrate (IMDUR) 30 MG 24 hr tablet TAKE 1/2 TABLET BY MOUTH DAILY 08/09/16  Yes Antonieta Ibaimothy J Gollan, MD  levothyroxine (SYNTHROID, LEVOTHROID) 25 MCG tablet Take 25 mcg by mouth daily before breakfast.   Yes Historical Provider, MD  loratadine (CLARITIN) 10 MG tablet Take 10 mg by mouth daily.   Yes Historical Provider, MD  metoprolol succinate (TOPROL-XL) 50 MG 24 hr tablet TAKE 1 1/2 TABLETS BY MOUTH DAILY. TAKE WITH OR IMMEDIATELY FOLLOWING A MEAL 02/06/16  Yes Antonieta Ibaimothy J Gollan, MD  omeprazole (PRILOSEC OTC) 20 MG tablet Take 20 mg by mouth 2 (two) times daily.   Yes Historical Provider, MD  polyethylene glycol powder (GLYCOLAX/MIRALAX) powder Take 17 g by mouth daily.   Yes Historical Provider, MD  rosuvastatin (CRESTOR) 40 MG tablet TAKE 1 TABLET(40 MG) BY MOUTH AT BEDTIME 08/08/16  Yes Antonieta Ibaimothy J Gollan, MD  VICTOZA 18 MG/3ML SOPN Inject 1.8 mg into the skin at bedtime. 10/04/16  Yes Historical Provider, MD  warfarin (COUMADIN)  6 MG tablet Take as directed by [facility] 05/22/16  Yes Antonieta Ibaimothy J Gollan, MD  ZETIA 10 MG tablet TAKE 1 TABLET BY MOUTH DAILY 08/15/16  Yes Antonieta Ibaimothy J Gollan, MD  enoxaparin (LOVENOX) 60 MG/0.6ML injection Inject 60 mg into the skin.    Historical Provider, MD  ketorolac (TORADOL) 10 MG tablet Take 10 mg by mouth every 6 (six) hours as needed.    Historical Provider, MD  nitroGLYCERIN (NITROSTAT) 0.4 MG SL tablet Place 1 tablet (0.4 mg total) under the tongue every 5 (five) minutes as needed for chest pain. 05/01/16   Antonieta Ibaimothy J Gollan, MD  potassium chloride (K-DUR) 10 MEQ tablet Take 1 tablet (10 mEq total) by mouth daily as needed. 04/04/16   Antonieta Ibaimothy J Gollan, MD      VITAL SIGNS:  Blood pressure (!) 142/85, pulse (!) 111, temperature 97.8 F (36.6 C), temperature source Oral, resp. rate 19, height 5\' 3"  (1.6 m), weight 52.6 kg (116 lb), SpO2 93 %.  PHYSICAL EXAMINATION:  GENERAL:  82 y.o.-year-old patient lying in the bed with no acute distress. Thin, fraile EYES: Pupils equal, round, reactive to light and accommodation. No scleral icterus. Extraocular muscles intact.  HEENT: Head atraumatic, normocephalic. Oropharynx and nasopharynx clear.  NECK:  Supple, no jugular venous distention. No thyroid enlargement, no tenderness.  LUNGS: Normal breath sounds bilaterally, no wheezing, rales,rhonchi or crepitation. No use of accessory muscles of respiration.  CARDIOVASCULAR: S1, S2 normal. No murmurs, rubs, or gallops.  ABDOMEN: Soft, nontender, nondistended. Bowel sounds present. No organomegaly or mass.  EXTREMITIES: No pedal edema, cyanosis, or clubbing.  NEUROLOGIC: Cranial nerves II through XII are intact. Muscle strength 5/5 in all extremities. Sensation intact. Gait not checked.  PSYCHIATRIC: The patient is alert and oriented x 3.  SKIN: No obvious rash, lesion, or ulcer.   LABORATORY PANEL:   CBC  Recent Labs Lab 10/08/16 0957  WBC 11.7*  HGB 10.1*  HCT 30.9*  PLT 293    ------------------------------------------------------------------------------------------------------------------  Chemistries   Recent Labs Lab 10/08/16 0957  NA 136  K 3.7  CL 100*  CO2 30  GLUCOSE 209*  BUN 23*  CREATININE 1.01*  CALCIUM 10.1  AST 28  ALT 16  ALKPHOS 49  BILITOT 0.2*   ------------------------------------------------------------------------------------------------------------------  Cardiac Enzymes  Recent Labs Lab 10/08/16 0957  TROPONINI <0.03   ------------------------------------------------------------------------------------------------------------------  RADIOLOGY:  Dg Chest 2 View  Result Date: 10/08/2016 CLINICAL DATA:  Nausea, vomiting, shortness of breath and weakness for 1 week. EXAM: CHEST  2 VIEW COMPARISON:  Single-view of the chest 09/09/2016 FINDINGS: There is a small left pleural effusion. No right effusion. Lungs appear clear. Heart size is enlarged. The patient is status post CABG. Aortic atherosclerosis is noted. The patient has lower thoracic compression fractures which appear remote. IMPRESSION: Small left pleural effusion. Cardiomegaly without edema. Atherosclerosis. Electronically Signed   By: Drusilla Kanner M.D.   On: 10/08/2016 10:54   Ct Angio Chest Pe W And/or Wo Contrast  Result Date: 10/08/2016 CLINICAL DATA:  Shortness of breath.  Abdominal pain. EXAM: CT ANGIOGRAPHY CHEST CT ABDOMEN AND PELVIS WITH CONTRAST TECHNIQUE: Multidetector CT imaging of the chest was performed using the standard protocol during bolus administration of intravenous contrast. Multiplanar CT image reconstructions and MIPs were obtained to evaluate the vascular anatomy. Multidetector CT imaging of the abdomen and pelvis was performed using the standard protocol during bolus administration of intravenous contrast. CONTRAST:  75 mL Isovue 370 nonionic COMPARISON:  Chest radiograph October 08, 2016 FINDINGS: CTA CHEST FINDINGS Cardiovascular: There  is no demonstrable pulmonary embolus. There is no appreciable thoracic aortic aneurysm or dissection. There is atherosclerotic calcification in the proximal visualized great vessels. Visualized great vessels otherwise appear unremarkable. There are scattered foci of atherosclerotic calcification in the aorta. There also foci of coronary artery calcification. There is a prosthetic mitral valve. There is mild left ventricular hypertrophy. The pericardium is not appreciably thickened. Mediastinum/Nodes: Visualized thyroid appears normal. There are scattered subcentimeter mediastinal lymph nodes but no demonstrable adenopathy by size criteria. Lungs/Pleura: There is a degree of underlying centrilobular emphysematous change. There is a small focus of airspace consolidation in the medial segment of the left lower lobe which abuts the middle mediastinum. These focal consolidation in the posterior segment of right lower lobe with questionable associated rounded atelectasis. There is mild scarring in both posterior lung bases. There is no pleural effusion or pleural thickening evident. Musculoskeletal: There is thoracic dextroscoliosis. There is increase in kyphosis. There is anterior wedging in several lower thoracic vertebral  bodies, with the greatest degree of wedging at T11 and T12. There are no blastic or lytic bone lesions. Review of the MIP images confirms the above findings. CT ABDOMEN and PELVIS FINDINGS Hepatobiliary: No focal liver lesions are appreciable. Gallbladder wall is not appreciably thickened. There is no biliary duct dilatation. Pancreas: There is no pancreatic mass or inflammatory focus. Spleen: No splenic lesions are evident. Adrenals/Urinary Tract: Adrenals appear unremarkable bilaterally. There are subcentimeter cysts in the right kidney. In the left kidney, there is a cyst arising from the upper pole region measuring 2.9 x 2.1 cm. A cyst in the lateral mid left kidney measures 1.3 x 1.1 cm. There is  scarring in the lateral upper to mid left kidney. There is no renal or ureteral calculus on either side. Urinary bladder is midline with wall thickness within normal limits. Stomach/Bowel: There are multiple sigmoid diverticula without diverticulitis. There is no bowel wall or mesenteric thickening. No bowel obstruction. No free air or portal venous air. There is no bowel pneumatosis. Vascular/Lymphatic: There is atherosclerotic calcification in the aorta and iliac arteries without aneurysm. There is extensive calcification at the origins of the major mesenteric arteries. No adenopathy is evident in the abdomen or pelvis. Reproductive: Uterus is anteverted. There is no pelvic mass or pelvic fluid collection. Other: Appendix appears normal. There is no ascites or abscess in the abdomen pelvis. Musculoskeletal: Bones are osteoporotic. There are anterior wedge compression fractures in the lumbar region the L1 and L3. There is inferior endplate compression at L4. There are no blastic or lytic bone lesions. There is no intramuscular or abdominal wall lesion. Review of the MIP images confirms the above findings. IMPRESSION: CT angiogram chest: No demonstrable pulmonary embolus. Foci of coronary artery calcification noted as well as atherosclerotic calcification in the aorta. No thoracic aortic aneurysm or dissection. Patient is status post mitral valve replacement. There is focal infiltrate in the medial segment of the left lower lobe abutting the middle mediastinum. Small focus of infiltrate with round atelectasis posterior right base. Underlying centrilobular emphysematous change. No adenopathy. Increased kyphosis with several thoracic compression fractures. CT abdomen and pelvis: Extensive aortoiliac atherosclerosis. Extensive mesenteric arterial calcification noted. No bowel pneumatosis or bowel wall thickening to suggest bowel ischemia. No bowel obstruction.  No abscess.  Appendix appears normal. No renal or ureteral  calculus.  No hydronephrosis. Bones osteoporotic with several compression fractures in the lumbar region. Electronically Signed   By: Bretta Bang III M.D.   On: 10/08/2016 13:47   Ct Abdomen Pelvis W Contrast  Result Date: 10/08/2016 : CT angiogram chest as well as CT abdomen and pelvis reports are combined into a single dictation. Electronically Signed   By: Bretta Bang III M.D.   On: 10/08/2016 13:49   Dg Abd 2 Views  Result Date: 10/08/2016 CLINICAL DATA:  Nausea, vomiting. EXAM: ABDOMEN - 2 VIEW COMPARISON:  Radiograph of December 21, 2003. FINDINGS: The bowel gas pattern is normal. There is no evidence of free air. No radio-opaque calculi or other significant radiographic abnormality is seen. IMPRESSION: No evidence of bowel obstruction or ileus. Electronically Signed   By: Lupita Raider, M.D.   On: 10/08/2016 10:51    EKG:    IMPRESSION AND PLAN:   [place] Fleissner  is a 82 y.o. female with a known history of GERD, peptic ulcer disease, CAD, hypertension, uncontrolled type 2 diabetes, status post mitral valve replacement on chronic anticoagulation with Coumadin who was recently admitted at an outlying facility  for EGD and was found to have aesophageal stricture underwent dilatation. Patient was discharged and then ended up having episode of pneumonia for which she to antibiotic and finished it about 2 weeks ago. Patient lives at home with his nephew Mr. Mitzi HansenMoody who brings her here because she has had episode of vomiting 2 yesterday and 1 today  1. Nausea vomiting for 2 days -Admit to medical floor -IV fluids -Full liquid diet advance as tolerated -When necessary Zofran  2. Failure to thrive/poor by mouth intake per nephew -Dietitian to see  3. History of esophageal stricture status post dilatation recently at an outlying hospital about 3 weeks ago. -Speech to eval for swallowing. Advance diet from full to soft as tolerated. -Continue PPI. -I will hold off on any GI  evaluation for now unless patient continues to vomit consider then  4. Uncontrolled diabetes continue Lantus and sliding scale  5. History of mitral valve replacement on oral anticoagulation -Patient is on bridging dose of Lovenox 60 mg daily along with Coumadin since she was taken off Coumadin for her EGD -Pharmacy to help re-dose Coumadin to get her INR therapeutic  6. Generalized weakness deconditioning physical therapy to see  Above was discussed with patient's nephew Mr. Mitzi HansenMoody  All the records are reviewed and case discussed with ED provider. Management plans discussed with the patient, family and they are in agreement.  CODE STATUS: DO NOT RESUSCITATE  TOTAL TIME TAKING CARE OF THIS PATIENT 50 minutes.    Tiesha Marich M.D on 10/08/2016 at 4:19 PM  Between 7am to 6pm - Pager - [phone]  After 6pm go to [email or web address] - password [email or web address] [email or web address]  [facility]  Office  [phone]  CC: Primary care physician; [name],[name], MD

## 2016-10-08 NOTE — ED Notes (Signed)
Pt returned from CT via stretcher.

## 2016-10-08 NOTE — ED Notes (Signed)
Dr. Yao at bedside. 

## 2016-10-09 LAB — GLUCOSE, CAPILLARY
GLUCOSE-CAPILLARY: 103 mg/dL — AB (ref 65–99)
GLUCOSE-CAPILLARY: 197 mg/dL — AB (ref 65–99)
Glucose-Capillary: 295 mg/dL — ABNORMAL HIGH (ref 65–99)
Glucose-Capillary: 132 mg/dL — ABNORMAL HIGH (ref 65–99)

## 2016-10-09 LAB — URINE CULTURE: CULTURE: NO GROWTH

## 2016-10-09 LAB — PROTIME-INR
INR: 1.32
Prothrombin Time: 16.5 seconds — ABNORMAL HIGH (ref 11.4–15.2)

## 2016-10-09 MED ORDER — WARFARIN SODIUM 2 MG PO TABS: 3.0000 mg | ORAL_TABLET | ORAL | Status: DC

## 2016-10-09 MED ORDER — ENSURE ENLIVE PO LIQD: 237.0000 mL | Freq: Two times a day (BID) | ORAL | Status: DC

## 2016-10-09 MED ORDER — WARFARIN SODIUM 2 MG PO TABS
6.0000 mg | ORAL_TABLET | ORAL | Status: DC
  Administered 2016-10-09: 6 mg via ORAL
  Filled 2016-10-09: qty 3

## 2016-10-09 MED ORDER — ALUM & MAG HYDROXIDE-SIMETH 200-200-20 MG/5ML PO SUSP
30.0000 mL | ORAL | Status: DC | PRN
  Administered 2016-10-10: 30 mL via ORAL
  Filled 2016-10-09: qty 30

## 2016-10-09 MED ORDER — ENSURE ENLIVE PO LIQD: 237.0000 mL | Freq: Two times a day (BID) | ORAL | 12 refills | Status: AC

## 2016-10-09 NOTE — Progress Notes (Signed)
ANTICOAGULATION CONSULT NOTE - Initial Consult  Pharmacy Consult for warfarin Indication: mechanical mitral valve  Allergies  Allergen Reactions  . Codeine     REACTION: makes her "crazy"  . Penicillins Hives  . Tramadol     Hallucinations    Patient Measurements: Height: 5\' 3"  (160 cm) Weight: 116 lb (52.6 kg) IBW/kg (Calculated) : 52.4   Vital Signs: Temp: 98 F (36.7 C) (12/20 0757) Temp Source: Oral (12/20 0757) BP: 126/62 (12/20 0757) Pulse Rate: 88 (12/20 0757)  Labs:  Recent Labs  10/07/16 10/08/16 0957 10/09/16 0533  HGB  --  10.1*  --   HCT  --  30.9*  --   PLT  --  293  --   LABPROT  --  14.2 16.5*  INR 1.1 1.10 1.32  CREATININE  --  1.01*  --   TROPONINI  --  <0.03  --     Estimated Creatinine Clearance: 31.2 mL/min (by C-G formula based on SCr of 1.01 mg/dL (H)).   Medical History: Past Medical History:  Diagnosis Date  . CHF (congestive heart failure) (HCC)   . Coronary artery disease   . Diabetes mellitus without complication (HCC)   . Hyperlipidemia   . Hypertension   . Mitral valve disease   . NSTEMI (non-ST elevated myocardial infarction) (HCC) 01/15/2014    Assessment: Patient currently on warfarin/enoxparin bridge as outpatient following a procedure. Taking warfarin 3mg  on Sunday, Tuesday, Thursday, and Saturday 6 mg on Monday, Wednesday, and Friday (TWD:30mg ) and enoxaparin 60mg  daily. MD would like to continue current enoxaparin dosing INR remains subtherapeutic.  Pt was in rehab 12/1-12/12 and was only receiving 2mg  daily. INR on d/c was 1. Pt dose was recently (12/13) increased to regimen above, however pt missed 2 doses (12/14 and 12/15). Pt took 3mg  on Sat and Sun and was instructed to take 6mg  on Monday, tues, and wed.  (12/18-12/20). We gave pt 8mg  last night. Pt appears to have been stable around 30mg  weekly prior to her procedure/hospitalization.  Goal of Therapy:  INR: 2.0-3.2 ( according to anticoag clinic notes)   Plan:   INR increased from 1.1 to 1.32. Resume pt dose of 6mg  MWF and 3mg  all other days. INR in the AM  Continue enoxaparin 55mg  SQ Q24H. Recheck INR with AM  Ajai Terhaar D Astin Sayre, Pharm.D, BCPS Clinical Pharmacist  10/09/2016,9:07 AM

## 2016-10-09 NOTE — Progress Notes (Signed)
Initial Nutrition Assessment  DOCUMENTATION CODES:   Severe malnutrition in context of chronic illness  INTERVENTION:  Provide Ensure Enlive po BID, each supplement provides 350 kcal and 20 grams of protein.  Provide snacks po BID between meals.  NUTRITION DIAGNOSIS:   Malnutrition (Severe) related to chronic illness as evidenced by severe depletion of body fat, severe depletion of muscle mass.  GOAL:   Patient will meet greater than or equal to 90% of their needs  MONITOR:   PO intake, Supplement acceptance, Diet advancement, Labs, Weight trends, I & O's  REASON FOR ASSESSMENT:   Malnutrition Screening Tool, Consult Poor PO  ASSESSMENT:   82 y.o. female with a known history of GERD, peptic ulcer disease, CAD, hypertension, uncontrolled type 2 diabetes, status post mitral valve replacement on chronic anticoagulation with Coumadin who was recently admitted at an outlying facility for EGD and was found to have aesophageal stricture underwent dilatation. Patient has had nausea/vomiting for 2 days.   -Per SLP evaluation today patient recommended to have Dysphagia 2 diet, thin liquids with strict reflux precautions, aspiration precautions.  -Noted there is now discharge summary in for patient to d/c home.  Spoke with patient at bedside. She reports her appetite is "okay." She reports having 100% of 3 meals daily (soup or vegetables) and one Ensure per day. Patient endorses N/V for the past few days and also difficulty swallowing. She reports UBW of 125 lbs and that she has lost 8 lbs over the last 2 weeks. Per chart patient has been 116 lbs since 03/2016. Could not weigh patient on bed scale to assess if current weight is accurate as she was on chair.  Meal Completion: 80% of FLD breakfast this morning per chart.  Medications reviewed and include: Vitamin D 5000 units daily, Novolog sliding scale TID with meals, Lantus 15 units daily, levothyroxine, pantoprazole, Miralax,  warfarin.  Labs reviewed: CBG 103-295 since admission last night, Chloride 100, BUN 23, Creatinine 1.01.   Nutrition-Focused physical exam completed. Findings are severe fat depletion, severe muscle depletion, and no edema. Patient meets criteria for severe chronic malnutrition due to NFPE findings of severe fat and muscle depletion.  Discussed with RN.   Diet Order:  Diet full liquid Room service appropriate? Yes; Fluid consistency: Thin  Skin:  Reviewed, no issues  Last BM:  10/08/2016  Height:   Ht Readings from Last 1 Encounters:  10/08/16 5\' 3"  (1.6 m)    Weight:   Wt Readings from Last 1 Encounters:  10/08/16 116 lb (52.6 kg)    Ideal Body Weight:  52.3 kg  BMI:  Body mass index is 20.55 kg/m.  Estimated Nutritional Needs:   Kcal:  1200-1400 (MSJ x 1.2-1.4)  Protein:  70-80 grams (1.3-1.5 grams/kg)  Fluid:  >/= 1.3-1.5 L/day (25-30 ml/kg)  EDUCATION NEEDS:   Education needs addressed  Helane RimaLeanne Freddi Forster, MS, RD, LDN Pager: [phone] After Hours Pager: [phone]

## 2016-10-09 NOTE — Discharge Instructions (Signed)

## 2016-10-09 NOTE — Care Management (Signed)
Patient to discharge home today.  Lives at home with nephew and family.  Family transports when needed.  RW in the home for ambulation.  PT has assessed patient and does not recommend any  PT follow up.  No RNCM needs identified.

## 2016-10-09 NOTE — Progress Notes (Signed)
Spoke with Quinn PlowmanNephew, James Moody regarding refusal for patient to be discharged. Mr. Mitzi HansenMoody expressed his concerns regarding the patient possibly aspirating or vomiting in the future, once food had been consumed. Mr. Mitzi HansenMoody vocalized his frustrations with the process, the physician decision and with the financial obligations that he felt were being forced upon his loved one. Discussed and contracted with Mr. Mitzi HansenMoody the option of his loved one staying overnight and receiving nurse services including monitoring and medication administration as ordered. Mr Mitzi HansenMoody was made aware that no further diagnostic tests would be ordered or conducted at this time as there was no medical indication for any per the Physician. Per Dr. Clint GuyHower and Dr. Allena KatzPatel (cosulted for second opinion)- the patient is stable and medically ready discharge. Mr. Mitzi HansenMoody is aware that the current Discharge Order will still stand, however the patient will still be monitored overnight to specifically observe for his concerns. Per discussion, expectation will be for Mr. Mitzi HansenMoody to come back in the morning and take patient home by 10:30am as long as no significant change. This plan was discussed with the Director of Nursing, Tomasa Randarol Harris, RN and all nursing parties involved agreed upon this contract. It was made clear to Mr. Mitzi HansenMoody that this decision in no way negated any financial obligations that may occur as a result of the is stay and that that decision was not up to Nursing Services. Mr. Mitzi HansenMoody verbalized understanding. Bevelyn NgoStephanie Bowen, RN- Case Manager was present for this conversation. Administration Aware of this concern and will follow up accordingly.

## 2016-10-09 NOTE — Evaluation (Signed)
Clinical/Bedside Swallow Evaluation Patient Details  Name: Amanda [name] MRN: [id number] Date of Birth: [birth year]  Today's Date: 10/09/2016 Time: SLP Start Time (ACUTE ONLY): 1010 SLP Stop Time (ACUTE ONLY): 1110 SLP Time Calculation (min) (ACUTE ONLY): 60 min  Past Medical History:  Past Medical History:  Diagnosis Date  . CHF (congestive heart failure) (HCC)   . Coronary artery disease   . Diabetes mellitus without complication (HCC)   . Hyperlipidemia   . Hypertension   . Mitral valve disease   . NSTEMI (non-ST elevated myocardial infarction) (HCC) 01/15/2014   Past Surgical History:  Past Surgical History:  Procedure Laterality Date  . CARDIAC CATHETERIZATION    . CORONARY ARTERY BYPASS GRAFT    . LEFT HEART CATHETERIZATION WITH CORONARY ANGIOGRAM N/A 01/17/2014   Procedure: LEFT HEART CATHETERIZATION WITH CORONARY ANGIOGRAM;  Surgeon: Lennette Biharihomas A Kelly, MD;  [facility];  Service: Cardiovascular;  Laterality: N/A;  . MITRAL VALVE REPLACEMENT     HPI:  Pt  is a 82 y.o. female with a known history of GERD, peptic ulcer disease, CAD, hypertension, uncontrolled type 2 diabetes, status post mitral valve replacement on chronic anticoagulation with Coumadin who was recently admitted at an outlying facility for EGD and was found to have aesophageal stricture underwent dilatation. Patient was discharged and then ended up having episode of pneumonia for which she to antibiotic and finished it about 2 weeks ago. Patient lives at home with his nephew Mr. Amanda [name] who brings her here because she has had episode of vomiting 2 yesterday and 1 today. The nephew fields patient is not able to tolerate oral intake and is getting dehydrated. Patient is being admitted for poor by mouth intake/failure to thrive/vomiting. Upon review, pt recently had EGD done for a sebaceous stenosis. Patient has been having intermittent episodes of vomiting. Pt stated she had been unable to eat "heavy" foods such  as meats and breads for "a long time". Pt stated she eats soups at home but does not have "much appetite". Pt is HOH.   Assessment / Plan / Recommendation Clinical Impression  Pt appears at reduced risk for aspiration from an oropharyngeal phase standpoint but is at increased risk for aspiration of REFLUX or Regurgitated material from the Esophagus d/t her Esophageal phase deficits as per EGD. Pt tolerated trials of thin liquids and purees w/ no overt s/s of aspiration noted; oral phase wfl for bolus management and clearing. Pt fed self. Recommend a Dysphagia level 2 diet consistency w/ thin liquids; general aspiration precautions; REFLUX precautions; dietary DRINK supplements for easier clearing. W/ NSG in the room, Pills were CRUSHED and given in ice cream for easier swallowing and Esophageal clearing - thorough discussion was given to pt and NSG on the importance and recommendation of taking her Pills this way from now on(check w/ Pharmacy for crushability). Pt would like NSG to instruct her Nephew on this method; NSG agreed. No further skilled ST services indicated at this time. Discussion was had w/ pt on the recommended foods/consistencies of the diet level 1 and 2 w/ added drink supplements from the Dietician recommendation for support. Dietician contacted and updated. - pt may not have as much desire for oral intake in quantity d/t the Esophageal phase deficit/issues.     Aspiration Risk   (reduced following precautions)    Diet Recommendation  Dysphagia level 2(w/ purees); thin liquids. Strict REFLUX precautions; aspiration precautions  Medication Administration: Crushed with puree (as able or in liquid form)  Other  Recommendations Recommended Consults: Consider GI evaluation (f/u for tx and monitoring) Oral Care Recommendations: Oral care BID;Staff/trained caregiver to provide oral care   Follow up Recommendations None      Frequency and Duration            Prognosis Prognosis  for Safe Diet Advancement: Good      Swallow Study   General Date of Onset: 10/08/16 HPI: Pt  is a 82 y.o. female with a known history of GERD, peptic ulcer disease, CAD, hypertension, uncontrolled type 2 diabetes, status post mitral valve replacement on chronic anticoagulation with Coumadin who was recently admitted at an outlying facility for EGD and was found to have aesophageal stricture underwent dilatation. Patient was discharged and then ended up having episode of pneumonia for which she to antibiotic and finished it about 2 weeks ago. Patient lives at home with his nephew Mr. Amanda [name] who brings her here because she has had episode of vomiting 2 yesterday and 1 today. The nephew fields patient is not able to tolerate oral intake and is getting dehydrated. Patient is being admitted for poor by mouth intake/failure to thrive/vomiting. Upon review, pt recently had EGD done for a sebaceous stenosis. Patient has been having intermittent episodes of vomiting. Pt stated she had been unable to eat "heavy" foods such as meats and breads for "a long time". Pt stated she eats soups at home but does not have "much appetite". Pt is HOH. Type of Study: Bedside Swallow Evaluation Previous Swallow Assessment: EGD Diet Prior to this Study: Dysphagia 3 (soft);Thin liquids (described at home) Temperature Spikes Noted: No Respiratory Status: Room air History of Recent Intubation: No Behavior/Cognition: Alert;Cooperative;Pleasant mood (HOH) Oral Cavity Assessment: Within Functional Limits;Dry (min) Oral Care Completed by SLP: Recent completion by staff Oral Cavity - Dentition: Dentures, top;Dentures, bottom (in place) Vision: Functional for self-feeding Self-Feeding Abilities: Able to feed self;Needs set up (min) Patient Positioning: Upright in bed Baseline Vocal Quality: Normal Volitional Cough: Strong Volitional Swallow: Able to elicit    Oral/Motor/Sensory Function Overall Oral Motor/Sensory Function:  Within functional limits   Ice Chips Ice chips: Not tested   Thin Liquid Thin Liquid: Within functional limits Presentation: Cup;Self Fed;Straw (8 trials; then 2 more at end) Other Comments: NSG reported no deficits w/ drinking liquids    Nectar Thick Nectar Thick Liquid: Not tested   Honey Thick Honey Thick Liquid: Not tested   Puree Puree: Within functional limits Presentation: Self Fed;Spoon (~3 ozs total)   Solid   GO   Solid: Not tested Other Comments: still on a Full Liquid diet consistency at this time per MD; pt stated "very soft foods work best" - suspect a Dysphagia level 2 diet.    Functional Assessment Tool Used: clinical judgement Functional Limitations: Swallowing Swallow Current Status ([id number]): At least 1 percent but less than 20 percent impaired, limited or restricted Swallow Goal Status [phone]([id number]): At least 1 percent but less than 20 percent impaired, limited or restricted Swallow Discharge Status [phone]([id number]): At least 1 percent but less than 20 percent impaired, limited or restricted    Jerilynn SomKatherine Watson, MS, CCC-SLP Watson,Katherine 10/09/2016,12:19 PM

## 2016-10-09 NOTE — Progress Notes (Addendum)
Nephew refuses discharge Patient tolerant of PO diet, Nephew wants "her to eat real food" I attempted to explain esophageal stricture and fibrous tissue. This has been dilated once before about a month ago, symptoms returned while at home  She has been in the hospital without eating difficulty. Evaluated by speech recommending small bites, dysphagia diet.  Given no symptoms, patient medically ready for discharge  Issues -esophageal stricture post dilation, tolerating diet -subtheraputic INR, receiving lovenox bridge with warfarin   -------- Informed given observation status patient/family unable to appeal discharge. Discharge order stands, as medically stable for discharge - in fact patient has tolerated yet another meal without symptoms.  With this information the nephew asked if tests for her gallbladder could be performed, GI could be consulted, second opinion, etc  Once again, remains medically stable for discharge and asymptomatic while in the hospital. Patient and family cautioned on dysphagia diet given esophageal stricture

## 2016-10-09 NOTE — Care Management Obs Status (Signed)
MEDICARE OBSERVATION STATUS NOTIFICATION   Patient Details  Name: Amanda [name] MRN: [id number] Date of Birth: 08-Jul-1925   Medicare Observation Status Notification Given:  Yes    Chapman FitchBOWEN, Carole Deere T, RN 10/09/2016, 10:25 AM

## 2016-10-09 NOTE — Discharge Summary (Signed)
[facility]   PATIENT NAME: Amanda [name]    MR#:  [id number]  DATE OF BIRTH:  14-Sep-1925  DATE OF ADMISSION:  10/08/2016 ADMITTING PHYSICIAN: Enedina Finner, MD  DATE OF DISCHARGE: 10/09/16  PRIMARY CARE PHYSICIAN: Junius Roads, MD    ADMISSION DIAGNOSIS:  Nausea vomiting  DISCHARGE DIAGNOSIS:  Nausea and vomiting subtheraputic inr SECONDARY DIAGNOSIS:   Past Medical History:  Diagnosis Date  . CHF (congestive heart failure) (HCC)   . Coronary artery disease   . Diabetes mellitus without complication (HCC)   . Hyperlipidemia   . Hypertension   . Mitral valve disease   . NSTEMI (non-ST elevated myocardial infarction) [facility]) 01/15/2014    HOSPITAL COURSE:  Amanda [name]  is a 82 y.o. female admitted 10/08/2016 with chief complaint Weakness and Nausea . Please see H&P performed by Enedina Finner, MD for further information. Patient presented with the above symptoms. Known esophageal stricture status post dilation. She was evaluated by speech recommending   Dysphagia level 2(w/ purees); thin liquids. Strict REFLUX precautions; aspiration precautions  Evaluated by PT: no follow up needed  DISCHARGE CONDITIONS:   stable  CONSULTS OBTAINED:    DRUG ALLERGIES:   Allergies  Allergen Reactions  . Codeine     REACTION: makes her "crazy"  . Penicillins Hives  . Tramadol     Hallucinations    DISCHARGE MEDICATIONS:   Current Discharge Medication List    CONTINUE these medications which have NOT CHANGED   Details  amLODipine (NORVASC) 5 MG tablet Take 5 mg by mouth daily.  Refills: 4    aspirin 81 MG EC tablet Take 81 mg by mouth daily.      benazepril (LOTENSIN) 20 MG tablet Take 20 mg by mouth daily.  Refills: 3    Cholecalciferol (D-3-5) 5000 UNITS capsule Take 5,000 Units by mouth daily.    furosemide (LASIX) 20 MG tablet Take 1 tablet (20 mg total) by mouth daily as needed. Qty: 30 tablet, Refills: 3      guaiFENesin (MUCINEX) 600 MG 12 hr tablet Take 600 mg by mouth 2 (two) times daily as needed for cough or to loosen phlegm.    insulin detemir (LEVEMIR) 100 UNIT/ML injection Inject 11-14 Units into the skin daily. Patient currently taking 11 units per day, scheduled to increase 3 units on Friday 10/11/16 until further testing by primary physician.    insulin lispro (HUMALOG) 100 UNIT/ML injection Inject 4-10 Units into the skin 3 (three) times daily with meals. Patient is using the following sliding scale:  201-250= 4 units 251-300= 6 units 301-350= 8 units 351-400= 10 units    isosorbide mononitrate (IMDUR) 30 MG 24 hr tablet TAKE 1/2 TABLET BY MOUTH DAILY Qty: 45 tablet, Refills: 3    levothyroxine (SYNTHROID, LEVOTHROID) 25 MCG tablet Take 25 mcg by mouth daily before breakfast.    loratadine (CLARITIN) 10 MG tablet Take 10 mg by mouth daily.    metoprolol succinate (TOPROL-XL) 50 MG 24 hr tablet TAKE 1 1/2 TABLETS BY MOUTH DAILY. TAKE WITH OR IMMEDIATELY FOLLOWING A MEAL Qty: 135 tablet, Refills: 3    omeprazole (PRILOSEC OTC) 20 MG tablet Take 20 mg by mouth 2 (two) times daily.    polyethylene glycol powder (GLYCOLAX/MIRALAX) powder Take 17 g by mouth daily.    rosuvastatin (CRESTOR) 40 MG tablet TAKE 1 TABLET(40 MG) BY MOUTH AT BEDTIME Qty: 90 tablet, Refills: 3    VICTOZA 18 MG/3ML SOPN Inject 1.8  mg into the skin at bedtime. Refills: 5    warfarin (COUMADIN) 6 MG tablet Take as directed by [facility] Qty: 35 tablet, Refills: 3    ZETIA 10 MG tablet TAKE 1 TABLET BY MOUTH DAILY Qty: 30 tablet, Refills: 3    enoxaparin (LOVENOX) 60 MG/0.6ML injection Inject 60 mg into the skin.    ketorolac (TORADOL) 10 MG tablet Take 10 mg by mouth every 6 (six) hours as needed.    nitroGLYCERIN (NITROSTAT) 0.4 MG SL tablet Place 1 tablet (0.4 mg total) under the tongue every 5 (five) minutes as needed for chest pain. Qty: 25 tablet, Refills: 3    potassium chloride (K-DUR)  10 MEQ tablet Take 1 tablet (10 mEq total) by mouth daily as needed. Qty: 30 tablet, Refills: 3         DISCHARGE INSTRUCTIONS:    DIET:  Dysphagia level 2(w/ purees); thin liquids. Strict REFLUX precautions; aspiration precautions  DISCHARGE CONDITION:  Stable  ACTIVITY:  Activity as tolerated  OXYGEN:  Home Oxygen: No.   Oxygen Delivery: room air  DISCHARGE LOCATION:  home   If you experience worsening of your admission symptoms, develop shortness of breath, life threatening emergency, suicidal or homicidal thoughts you must seek medical attention immediately by calling 911 or calling your MD immediately  if symptoms less severe.  You Must read complete instructions/literature along with all the possible adverse reactions/side effects for all the Medicines you take and that have been prescribed to you. Take any new Medicines after you have completely understood and accpet all the possible adverse reactions/side effects.   Please note  You were cared for by a hospitalist during your hospital stay. If you have any questions about your discharge medications or the care you received while you were in the hospital after you are discharged, you can call the unit and asked to speak with the hospitalist on call if the hospitalist that took care of you is not available. Once you are discharged, your primary care physician will handle any further medical issues. Please note that NO REFILLS for any discharge medications will be authorized once you are discharged, as it is imperative that you return to your primary care physician (or establish a relationship with a primary care physician if you do not have one) for your aftercare needs so that they can reassess your need for medications and monitor your lab values.    On the day of Discharge:   VITAL SIGNS:  Blood pressure 124/64, pulse 86, temperature 98 F (36.7 C), temperature source Oral, resp. rate 19, height 5\' 3"  (1.6 m),  weight 52.6 kg (116 lb), SpO2 96 %.  I/O:   Intake/Output Summary (Last 24 hours) at 10/09/16 1302 Last data filed at 10/09/16 1022  Gross per 24 hour  Intake              600 ml  Output              150 ml  Net              450 ml    PHYSICAL EXAMINATION:  GENERAL:  82 y.o.-year-old patient lying in the bed with no acute distress.  EYES: Pupils equal, round, reactive to light and accommodation. No scleral icterus. Extraocular muscles intact.  HEENT: Head atraumatic, normocephalic. Oropharynx and nasopharynx clear.  NECK:  Supple, no jugular venous distention. No thyroid enlargement, no tenderness.  LUNGS: Normal breath sounds bilaterally, no wheezing, rales,rhonchi or crepitation. No  use of accessory muscles of respiration.  CARDIOVASCULAR: S1, S2 normal. No murmurs, rubs, or gallops.  ABDOMEN: Soft, non-tender, non-distended. Bowel sounds present. No organomegaly or mass.  EXTREMITIES: No pedal edema, cyanosis, or clubbing.  NEUROLOGIC: Cranial nerves II through XII are intact. Muscle strength 5/5 in all extremities. Sensation intact. Gait not checked.  PSYCHIATRIC: The patient is alert and oriented x 3.  SKIN: No obvious rash, lesion, or ulcer.   DATA REVIEW:   CBC  Recent Labs Lab 10/08/16 0957  WBC 11.7*  HGB 10.1*  HCT 30.9*  PLT 293    Chemistries   Recent Labs Lab 10/08/16 0957  NA 136  K 3.7  CL 100*  CO2 30  GLUCOSE 209*  BUN 23*  CREATININE 1.01*  CALCIUM 10.1  AST 28  ALT 16  ALKPHOS 49  BILITOT 0.2*    Cardiac Enzymes  Recent Labs Lab 10/08/16 0957  TROPONINI <0.03    Microbiology Results  Results for orders placed or performed during the hospital encounter of 10/08/16  Blood culture (routine x 2)     Status: None (Preliminary result)   Collection Time: 10/08/16  9:57 AM  Result Value Ref Range Status   Specimen Description BLOOD LEFT FOREARM  Final   Special Requests   Final    BOTTLES DRAWN AEROBIC AND ANAEROBIC  AEROBIC 14CC,  ANAEROBIC 12CC   Culture NO GROWTH < 24 HOURS  Final   Report Status PENDING  Incomplete  Blood culture (routine x 2)     Status: None (Preliminary result)   Collection Time: 10/08/16  9:57 AM  Result Value Ref Range Status   Specimen Description BLOOD LEFT ASSIST CONTROL  Final   Special Requests   Final    BOTTLES DRAWN AEROBIC AND ANAEROBIC  AEROBIC 17CC, ANAEROBIC 13CC   Culture NO GROWTH < 24 HOURS  Final   Report Status PENDING  Incomplete  Urine culture     Status: None   Collection Time: 10/08/16 11:51 AM  Result Value Ref Range Status   Specimen Description URINE, RANDOM  Final   Special Requests NONE  Final   Culture NO GROWTH Performed at [facility]   Final   Report Status 10/09/2016 FINAL  Final    RADIOLOGY:  Dg Chest 2 View  Result Date: 10/08/2016 CLINICAL DATA:  Nausea, vomiting, shortness of breath and weakness for 1 week. EXAM: CHEST  2 VIEW COMPARISON:  Single-view of the chest 09/09/2016 FINDINGS: There is a small left pleural effusion. No right effusion. Lungs appear clear. Heart size is enlarged. The patient is status post CABG. Aortic atherosclerosis is noted. The patient has lower thoracic compression fractures which appear remote. IMPRESSION: Small left pleural effusion. Cardiomegaly without edema. Atherosclerosis. Electronically Signed   By: Drusilla Kanner M.D.   On: 10/08/2016 10:54   Ct Angio Chest Pe W And/or Wo Contrast  Result Date: 10/08/2016 CLINICAL DATA:  Shortness of breath.  Abdominal pain. EXAM: CT ANGIOGRAPHY CHEST CT ABDOMEN AND PELVIS WITH CONTRAST TECHNIQUE: Multidetector CT imaging of the chest was performed using the standard protocol during bolus administration of intravenous contrast. Multiplanar CT image reconstructions and MIPs were obtained to evaluate the vascular anatomy. Multidetector CT imaging of the abdomen and pelvis was performed using the standard protocol during bolus administration of intravenous contrast. CONTRAST:   75 mL Isovue 370 nonionic COMPARISON:  Chest radiograph October 08, 2016 FINDINGS: CTA CHEST FINDINGS Cardiovascular: There is no demonstrable pulmonary embolus. There is no appreciable  thoracic aortic aneurysm or dissection. There is atherosclerotic calcification in the proximal visualized great vessels. Visualized great vessels otherwise appear unremarkable. There are scattered foci of atherosclerotic calcification in the aorta. There also foci of coronary artery calcification. There is a prosthetic mitral valve. There is mild left ventricular hypertrophy. The pericardium is not appreciably thickened. Mediastinum/Nodes: Visualized thyroid appears normal. There are scattered subcentimeter mediastinal lymph nodes but no demonstrable adenopathy by size criteria. Lungs/Pleura: There is a degree of underlying centrilobular emphysematous change. There is a small focus of airspace consolidation in the medial segment of the left lower lobe which abuts the middle mediastinum. These focal consolidation in the posterior segment of right lower lobe with questionable associated rounded atelectasis. There is mild scarring in both posterior lung bases. There is no pleural effusion or pleural thickening evident. Musculoskeletal: There is thoracic dextroscoliosis. There is increase in kyphosis. There is anterior wedging in several lower thoracic vertebral bodies, with the greatest degree of wedging at T11 and T12. There are no blastic or lytic bone lesions. Review of the MIP images confirms the above findings. CT ABDOMEN and PELVIS FINDINGS Hepatobiliary: No focal liver lesions are appreciable. Gallbladder wall is not appreciably thickened. There is no biliary duct dilatation. Pancreas: There is no pancreatic mass or inflammatory focus. Spleen: No splenic lesions are evident. Adrenals/Urinary Tract: Adrenals appear unremarkable bilaterally. There are subcentimeter cysts in the right kidney. In the left kidney, there is a cyst  arising from the upper pole region measuring 2.9 x 2.1 cm. A cyst in the lateral mid left kidney measures 1.3 x 1.1 cm. There is scarring in the lateral upper to mid left kidney. There is no renal or ureteral calculus on either side. Urinary bladder is midline with wall thickness within normal limits. Stomach/Bowel: There are multiple sigmoid diverticula without diverticulitis. There is no bowel wall or mesenteric thickening. No bowel obstruction. No free air or portal venous air. There is no bowel pneumatosis. Vascular/Lymphatic: There is atherosclerotic calcification in the aorta and iliac arteries without aneurysm. There is extensive calcification at the origins of the major mesenteric arteries. No adenopathy is evident in the abdomen or pelvis. Reproductive: Uterus is anteverted. There is no pelvic mass or pelvic fluid collection. Other: Appendix appears normal. There is no ascites or abscess in the abdomen pelvis. Musculoskeletal: Bones are osteoporotic. There are anterior wedge compression fractures in the lumbar region the L1 and L3. There is inferior endplate compression at L4. There are no blastic or lytic bone lesions. There is no intramuscular or abdominal wall lesion. Review of the MIP images confirms the above findings. IMPRESSION: CT angiogram chest: No demonstrable pulmonary embolus. Foci of coronary artery calcification noted as well as atherosclerotic calcification in the aorta. No thoracic aortic aneurysm or dissection. Patient is status post mitral valve replacement. There is focal infiltrate in the medial segment of the left lower lobe abutting the middle mediastinum. Small focus of infiltrate with round atelectasis posterior right base. Underlying centrilobular emphysematous change. No adenopathy. Increased kyphosis with several thoracic compression fractures. CT abdomen and pelvis: Extensive aortoiliac atherosclerosis. Extensive mesenteric arterial calcification noted. No bowel pneumatosis or  bowel wall thickening to suggest bowel ischemia. No bowel obstruction.  No abscess.  Appendix appears normal. No renal or ureteral calculus.  No hydronephrosis. Bones osteoporotic with several compression fractures in the lumbar region. Electronically Signed   By: Bretta Bang III M.D.   On: 10/08/2016 13:47   Ct Abdomen Pelvis W Contrast  Result Date: 10/08/2016 :  CT angiogram chest as well as CT abdomen and pelvis reports are combined into a single dictation. Electronically Signed   By: Bretta BangWilliam  Woodruff III M.D.   On: 10/08/2016 13:49   Dg Abd 2 Views  Result Date: 10/08/2016 CLINICAL DATA:  Nausea, vomiting. EXAM: ABDOMEN - 2 VIEW COMPARISON:  Radiograph of December 21, 2003. FINDINGS: The bowel gas pattern is normal. There is no evidence of free air. No radio-opaque calculi or other significant radiographic abnormality is seen. IMPRESSION: No evidence of bowel obstruction or ileus. Electronically Signed   By: Lupita RaiderJames  Green Jr, M.D.   On: 10/08/2016 10:51     Management plans discussed with the patient, family and they are in agreement.  CODE STATUS:     Code Status Orders        Start     Ordered   10/08/16 1727  Do not attempt resuscitation (DNR)  Continuous    Question Answer Comment  In the event of cardiac or respiratory ARREST Do not call a "code blue"   In the event of cardiac or respiratory ARREST Do not perform Intubation, CPR, defibrillation or ACLS   In the event of cardiac or respiratory ARREST Use medication by any route, position, wound care, and other measures to relive pain and suffering. May use oxygen, suction and manual treatment of airway obstruction as needed for comfort.      10/08/16 1726    Code Status History    Date Active Date Inactive Code Status Order ID Comments User Context   01/17/2014 10:34 AM 01/21/2014  4:19 PM Full Code [id number]  Lennette Biharihomas A Kelly, MD Inpatient   01/15/2014  4:27 PM 01/17/2014 10:34 AM DNR [id number]  Leone BrandLaura R Ingold, NP Inpatient      Advance Directive Documentation   Flowsheet Row Most Recent Value  Type of Advance Directive  Healthcare Power of Attorney, Living will, Out of facility DNR (pink MOST or yellow form)  Pre-existing out of facility DNR order (yellow form or pink MOST form)  No data  "MOST" Form in Place?  No data      TOTAL TIME TAKING CARE OF THIS PATIENT: 33 minutes.    Keosha Rossa,  Mardi MainlandDavid K M.D on 10/09/2016 at 1:02 PM  Between 7am to 6pm - Pager - [phone]  After 6pm go to [email or web address] - Social research officer, governmentpassword [email or web address]  [employer] [place] [facility]  Office  [phone]  CC: Primary care physician; [name],[name], MD

## 2016-10-09 NOTE — Care Management (Signed)
Patient's nephew at bedside.  Declining to take patient home at discharge.  He states that he disagrees with Dr. Jarrett SohoHower's discharge plan.  I have delivered and reviewed ABN with patient and nephew.  Declined to sign. I have placed a copy in the chart, as well as left a copy with the patient. Per families request a second opinion was obtained by Dr. Enedina FinnerSona Patel who also agrees with the discharge plan.

## 2016-10-09 NOTE — Evaluation (Signed)
Physical Therapy Evaluation Patient Details Name: Amanda [name] MRN: [id number] DOB: 11-13-1924 Today's Date: 10/09/2016   History of Present Illness  Patient is an 82 y/o female admitted for weakness/confusion and having vomiting spells. She recently had EGD performed.   Clinical Impression  Patient admitted with weakness/confusion possibly a result of emesis spells. She reports she is feeling much better and per this assessment she appears to be back to her baseline level of mobility. No deficits identified with bed mobility, transfers. She is able to ambulate more than household distance with no breaks or loss of balance. She does not appear to require any PT follow up after discharge at this time.     Follow Up Recommendations No PT follow up    Equipment Recommendations       Recommendations for Other Services       Precautions / Restrictions Precautions Precautions: Fall Restrictions Weight Bearing Restrictions: No      Mobility  Bed Mobility Overal bed mobility: Independent             General bed mobility comments: No deficits noted in bed mobility.   Transfers Overall transfer level: Modified independent Equipment used: Rolling walker (2 wheeled)             General transfer comment: Patient is able to transfer with RW, no loss of balance noted.   Ambulation/Gait Ambulation/Gait assistance: Supervision Ambulation Distance (Feet): 300 Feet Assistive device: Rolling walker (2 wheeled) Gait Pattern/deviations: WFL(Within Functional Limits)   Gait velocity interpretation: <1.8 ft/sec, indicative of risk for recurrent falls General Gait Details: Decreased step length bilaterally and decreased cadence, but no loss of balance.   Stairs            Wheelchair Mobility    Modified Rankin (Stroke Patients Only)       Balance Overall balance assessment: Needs assistance Sitting-balance support: No upper extremity supported Sitting balance-Leahy  Scale: Good     Standing balance support: Bilateral upper extremity supported Standing balance-Leahy Scale: Fair                               Pertinent Vitals/Pain Pain Assessment: No/denies pain    Home Living Family/patient expects to be discharged to:: Private residence Living Arrangements: Other relatives Available Help at Discharge: Family;Available 24 hours/day Type of Home: House Home Access: Stairs to enter   Enter[employer] CorporationEntrance Stairs-Number of Steps: 12   Home Equipment: Walker - 4 wheels      Prior Function Level of Independence: Independent with assistive device(s)         Comments: Per nephew present, she has been able to ascend/descend 12 steps without issue. She has been usign [phone] with no recent falls.      Hand Dominance        Extremity/Trunk Assessment   Upper Extremity Assessment Upper Extremity Assessment: Overall WFL for tasks assessed    Lower Extremity Assessment Lower Extremity Assessment: Overall WFL for tasks assessed       Communication   Communication: No difficulties  Cognition Arousal/Alertness: Awake/alert Behavior During Therapy: WFL for tasks assessed/performed Overall Cognitive Status: Within Functional Limits for tasks assessed                      General Comments      Exercises     Assessment/Plan    PT Assessment Patient needs continued PT services  PT Problem List Decreased strength;Decreased  balance          PT Treatment Interventions DME instruction;Gait training;Stair training;Therapeutic exercise;Therapeutic activities;Balance training    PT Goals (Current goals can be found in the Care Plan section)  Acute Rehab PT Goals Patient Stated Goal: To return home  PT Goal Formulation: With patient Time For Goal Achievement: 10/23/16 Potential to Achieve Goals: Good    Frequency Min 2X/week   Barriers to discharge        Co-evaluation               End of Session Equipment  Utilized During Treatment: Gait belt Activity Tolerance: Patient tolerated treatment well Patient left: in chair;with call bell/phone within reach;with chair alarm set Nurse Communication: Mobility status    Functional Assessment Tool Used: Clinical judgement  Functional Limitation: Mobility: Walking and moving around Mobility: Walking and Moving Around Current Status [phone](G8978): At least 1 percent but less than 20 percent impaired, limited or restricted Mobility: Walking and Moving Around Goal Status [phone](G8979): At least 1 percent but less than 20 percent impaired, limited or restricted    Time: 0945-1005 PT Time Calculation (min) (ACUTE ONLY): 20 min   Charges:   PT Evaluation $PT Eval Moderate Complexity: 1 Procedure     PT G Codes:   PT G-Codes **NOT FOR INPATIENT CLASS** Functional Assessment Tool Used: Clinical judgement  Functional Limitation: Mobility: Walking and moving around Mobility: Walking and Moving Around Current Status ([id number](G8978): At least 1 percent but less than 20 percent impaired, limited or restricted Mobility: Walking and Moving Around Goal Status [phone](G8979): At least 1 percent but less than 20 percent impaired, limited or restricted   Kerin RansomPatrick A McNamara, PT, DPT    10/09/2016, 10:31 AM

## 2016-10-10 LAB — PROTIME-INR
INR: 1.55
Prothrombin Time: 18.7 seconds — ABNORMAL HIGH (ref 11.4–15.2)

## 2016-10-10 LAB — GLUCOSE, CAPILLARY: Glucose-Capillary: 133 mg/dL — ABNORMAL HIGH (ref 65–99)

## 2016-10-10 LAB — PROCALCITONIN: Procalcitonin: <0.1 ng/mL

## 2016-10-10 NOTE — Progress Notes (Signed)
10/10/2016  10:35  Amanda [name] to be D/C'd Home per MD order.  Discussed prescriptions and follow up appointments with the patient. Prescriptions given to patient, medication list explained in detail. Pt verbalized understanding.    Vitals:   10/10/16 0330 10/10/16 0754  BP: (!) 159/63 (!) 153/55  Pulse: (!) 106 96  Resp: 18 15  Temp: 98.2 F (36.8 C) 98.4 F (36.9 C)    Skin clean, dry and intact without evidence of skin break down, no evidence of skin tears noted. IV catheter discontinued intact. Site without signs and symptoms of complications. Dressing and pressure applied. Pt denies pain at this time. No complaints noted.  An After Visit Summary was printed and given to the patient. Patient escorted via WC, and D/C home via private auto.  Bradly Chrisougherty, Curtistine Pettitt E

## 2016-10-10 NOTE — Care Management (Signed)
Resumption for home health care RN placed.  [facility] notified, orders faxed.  RNCM signing off

## 2016-10-11 ENCOUNTER — Ambulatory Visit (INDEPENDENT_AMBULATORY_CARE_PROVIDER_SITE_OTHER): Payer: Medicare Other | Admitting: Interventional Cardiology

## 2016-10-11 DIAGNOSIS — Z9889 Other specified postprocedural states: Secondary | ICD-10-CM

## 2016-10-11 DIAGNOSIS — I059 Rheumatic mitral valve disease, unspecified: Secondary | ICD-10-CM

## 2016-10-11 DIAGNOSIS — Z5181 Encounter for therapeutic drug level monitoring: Secondary | ICD-10-CM

## 2016-10-11 LAB — POCT INR: INR: 1.5

## 2016-10-13 LAB — CULTURE, BLOOD (ROUTINE X 2)
CULTURE: NO GROWTH
Culture: NO GROWTH

## 2016-10-15 ENCOUNTER — Ambulatory Visit (INDEPENDENT_AMBULATORY_CARE_PROVIDER_SITE_OTHER): Payer: Medicare Other | Admitting: *Deleted

## 2016-10-15 DIAGNOSIS — Z9889 Other specified postprocedural states: Secondary | ICD-10-CM | POA: Diagnosis not present

## 2016-10-15 DIAGNOSIS — I059 Rheumatic mitral valve disease, unspecified: Secondary | ICD-10-CM | POA: Diagnosis not present

## 2016-10-15 DIAGNOSIS — Z952 Presence of prosthetic heart valve: Secondary | ICD-10-CM

## 2016-10-15 DIAGNOSIS — Z5181 Encounter for therapeutic drug level monitoring: Secondary | ICD-10-CM

## 2016-10-15 LAB — POCT INR: INR: 1.7

## 2016-10-15 MED ORDER — ENOXAPARIN SODIUM 60 MG/0.6ML ~~LOC~~ SOLN: 60.0000 mg | SUBCUTANEOUS | 0 refills | Status: DC

## 2016-10-18 ENCOUNTER — Ambulatory Visit (INDEPENDENT_AMBULATORY_CARE_PROVIDER_SITE_OTHER): Payer: Medicare Other | Admitting: *Deleted

## 2016-10-18 DIAGNOSIS — Z5181 Encounter for therapeutic drug level monitoring: Secondary | ICD-10-CM | POA: Diagnosis not present

## 2016-10-18 DIAGNOSIS — I059 Rheumatic mitral valve disease, unspecified: Secondary | ICD-10-CM | POA: Diagnosis not present

## 2016-10-18 DIAGNOSIS — Z9889 Other specified postprocedural states: Secondary | ICD-10-CM | POA: Diagnosis not present

## 2016-10-18 DIAGNOSIS — Z952 Presence of prosthetic heart valve: Secondary | ICD-10-CM | POA: Diagnosis not present

## 2016-10-18 LAB — POCT INR: INR: 2.1

## 2016-10-25 ENCOUNTER — Ambulatory Visit (INDEPENDENT_AMBULATORY_CARE_PROVIDER_SITE_OTHER): Payer: Medicare Other | Admitting: Cardiovascular Disease

## 2016-10-25 DIAGNOSIS — Z9889 Other specified postprocedural states: Secondary | ICD-10-CM

## 2016-10-25 DIAGNOSIS — Z5181 Encounter for therapeutic drug level monitoring: Secondary | ICD-10-CM

## 2016-10-25 DIAGNOSIS — I059 Rheumatic mitral valve disease, unspecified: Secondary | ICD-10-CM

## 2016-10-25 LAB — POCT INR: INR: 1.9

## 2016-11-01 ENCOUNTER — Ambulatory Visit (INDEPENDENT_AMBULATORY_CARE_PROVIDER_SITE_OTHER): Payer: Medicare Other | Admitting: Cardiology

## 2016-11-01 DIAGNOSIS — Z5181 Encounter for therapeutic drug level monitoring: Secondary | ICD-10-CM

## 2016-11-01 DIAGNOSIS — Z9889 Other specified postprocedural states: Secondary | ICD-10-CM

## 2016-11-01 DIAGNOSIS — I059 Rheumatic mitral valve disease, unspecified: Secondary | ICD-10-CM

## 2016-11-01 LAB — POCT INR: INR: 2.5

## 2016-11-13 ENCOUNTER — Telehealth: Payer: Self-pay | Admitting: *Deleted

## 2016-11-13 NOTE — Telephone Encounter (Signed)
[facility] with [facility] at [facility] to inquire about pt's INR which was due on 11/08/16.  She stated the pt's original  Home Health RN is out on leave but they would be able to obtain today.  She stated that she called to set up an appt to have INR checked & per Candice the caregiver/son stated that they could come on Friday 11/15/16 to check INR.  Advised that it is very important to have INR checked and if Friday is the only the day he would allow then we will await INR results on that day. She was given the number to call INR results to [facility], too.

## 2016-11-15 ENCOUNTER — Ambulatory Visit (INDEPENDENT_AMBULATORY_CARE_PROVIDER_SITE_OTHER): Payer: Medicare Other

## 2016-11-15 DIAGNOSIS — I059 Rheumatic mitral valve disease, unspecified: Secondary | ICD-10-CM

## 2016-11-15 DIAGNOSIS — Z5181 Encounter for therapeutic drug level monitoring: Secondary | ICD-10-CM | POA: Diagnosis not present

## 2016-11-15 DIAGNOSIS — Z9889 Other specified postprocedural states: Secondary | ICD-10-CM

## 2016-11-15 LAB — POCT INR: INR: 2.9

## 2016-11-29 ENCOUNTER — Ambulatory Visit (INDEPENDENT_AMBULATORY_CARE_PROVIDER_SITE_OTHER): Payer: Medicare Other | Admitting: Internal Medicine

## 2016-11-29 DIAGNOSIS — I059 Rheumatic mitral valve disease, unspecified: Secondary | ICD-10-CM

## 2016-11-29 DIAGNOSIS — Z5181 Encounter for therapeutic drug level monitoring: Secondary | ICD-10-CM

## 2016-11-29 DIAGNOSIS — Z9889 Other specified postprocedural states: Secondary | ICD-10-CM

## 2016-11-29 LAB — POCT INR: INR: 3.1

## 2016-12-11 ENCOUNTER — Ambulatory Visit (INDEPENDENT_AMBULATORY_CARE_PROVIDER_SITE_OTHER): Payer: Medicare Other

## 2016-12-11 DIAGNOSIS — Z9889 Other specified postprocedural states: Secondary | ICD-10-CM

## 2016-12-11 DIAGNOSIS — Z952 Presence of prosthetic heart valve: Secondary | ICD-10-CM | POA: Diagnosis not present

## 2016-12-11 DIAGNOSIS — I059 Rheumatic mitral valve disease, unspecified: Secondary | ICD-10-CM | POA: Diagnosis not present

## 2016-12-11 DIAGNOSIS — Z5181 Encounter for therapeutic drug level monitoring: Secondary | ICD-10-CM

## 2016-12-11 LAB — POCT INR: INR: 2.2

## 2016-12-23 ENCOUNTER — Other Ambulatory Visit: Payer: Self-pay | Admitting: Cardiovascular Disease

## 2016-12-23 NOTE — Telephone Encounter (Signed)
Please review for refill. Thanks!  

## 2016-12-24 NOTE — Telephone Encounter (Signed)
Please review for refill. Thanks!  

## 2016-12-30 DIAGNOSIS — Z7189 Other specified counseling: Secondary | ICD-10-CM | POA: Insufficient documentation

## 2016-12-30 NOTE — Progress Notes (Deleted)
Cardiology Office Note  Date:  12/30/2016   ID:  Amanda [name], DOB 06-22-1925, MRN [id number]  PCP:  Amanda [name],Amanda B, MD   No chief complaint on file.   HPI:  83 -year-old woman with a past medical history of coronary artery disease, bypass surgery in 2004 at [facility] with a mechanical mitral valve on warfarin, with history of hypertension, hyperlipidemia who presents for routine followup of her coronary artery disease. Recent trip to the emergency room last week for severe lower abdominal pain. CTA of the abdomen showed atherosclerosis of aorta, celiac vessels Prior history of nausea  In follow-up, she reports that she has noticed increasing shortness of breath over the past several weeks Perhaps some lower extremity edema Able to walk short distance without symptoms, but walking from room to room in her house she has shortness of breath symptoms Denies any significant weight gain, change in diet. Denies excessive fluid intake.  Prior echocardiogram from April 2015 reviewed with her ejection fraction 45-50%, mild to moderate AI, mild stenosis from bioprosthetic mitral valve  Prior history of abdominal discomfort, history of nausea Previous Lab work shows total cholesterol 214, LDL 111, hemoglobin A1c 8.4.  Low vitamin D  EKG shows normal sinus rhythm with rate 86 bpm, right bundle branch block.  Other past medical history Prior hospitalization at [place] 01/14/2014 with chest pain. Cardiac catheterization showed patent grafts, saphenous vein graft to the RCA and OM, patent graft to the mid LAD. She has severe three-vessel disease. Medical management was recommended. In follow-up she was feeling weak. high fall risk, not able to do her ADLs with assistance devices. Needing a walker and full assistance  Previous total cholesterol 178, LDL 78 Previous hemoglobin A1c greater than 8   PMH:   has a past medical history of CHF (congestive heart failure) (HCC);  Coronary artery disease; Diabetes mellitus without complication (HCC); Hyperlipidemia; Hypertension; Mitral valve disease; and NSTEMI (non-ST elevated myocardial infarction) (HCC) (01/15/2014).  PSH:    Past Surgical History:  Procedure Laterality Date  . CARDIAC CATHETERIZATION    . CORONARY ARTERY BYPASS GRAFT    . LEFT HEART CATHETERIZATION WITH CORONARY ANGIOGRAM N/A 01/17/2014   Procedure: LEFT HEART CATHETERIZATION WITH CORONARY ANGIOGRAM;  Surgeon: Lennette Biharihomas A Kelly, MD;  [facility];  Service: Cardiovascular;  Laterality: N/A;  . MITRAL VALVE REPLACEMENT      Current Outpatient Prescriptions  Medication Sig Dispense Refill  . amLODipine (NORVASC) 5 MG tablet Take 5 mg by mouth daily.   4  . aspirin 81 MG EC tablet Take 81 mg by mouth daily.      . benazepril (LOTENSIN) 20 MG tablet Take 20 mg by mouth daily.   3  . Cholecalciferol (D-3-5) 5000 UNITS capsule Take 5,000 Units by mouth daily.    Marland Kitchen. enoxaparin (LOVENOX) 60 MG/0.6ML injection Inject 0.6 mLs (60 mg total) into the skin daily. 6 Syringe 0  . feeding supplement, ENSURE ENLIVE, (ENSURE ENLIVE) LIQD Take 237 mLs by mouth 2 (two) times daily between meals. 237 mL 12  . furosemide (LASIX) 20 MG tablet Take 1 tablet (20 mg total) by mouth daily as needed. 30 tablet 3  . guaiFENesin (MUCINEX) 600 MG 12 hr tablet Take 600 mg by mouth 2 (two) times daily as needed for cough or to loosen phlegm.    . insulin detemir (LEVEMIR) 100 UNIT/ML injection Inject 11-14 Units into the skin daily. Patient currently taking 11 units per day, scheduled to increase 3  units on Friday 10/11/16 until further testing by primary physician.    . insulin lispro (HUMALOG) 100 UNIT/ML injection Inject 4-10 Units into the skin 3 (three) times daily with meals. Patient is using the following sliding scale:  201-250= 4 units 251-300= 6 units 301-350= 8 units 351-400= 10 units    . isosorbide mononitrate (IMDUR) 30 MG 24 hr tablet TAKE 1/2 TABLET BY  MOUTH DAILY 45 tablet 3  . ketorolac (TORADOL) 10 MG tablet Take 10 mg by mouth every 6 (six) hours as needed.    Marland Kitchen levothyroxine (SYNTHROID, LEVOTHROID) 25 MCG tablet Take 25 mcg by mouth daily before breakfast.    . loratadine (CLARITIN) 10 MG tablet Take 10 mg by mouth daily.    . metoprolol succinate (TOPROL-XL) 50 MG 24 hr tablet TAKE 1 1/2 TABLETS BY MOUTH DAILY. TAKE WITH OR IMMEDIATELY FOLLOWING A MEAL 135 tablet 3  . nitroGLYCERIN (NITROSTAT) 0.4 MG SL tablet Place 1 tablet (0.4 mg total) under the tongue every 5 (five) minutes as needed for chest pain. 25 tablet 3  . omeprazole (PRILOSEC OTC) 20 MG tablet Take 20 mg by mouth 2 (two) times daily.    . polyethylene glycol powder (GLYCOLAX/MIRALAX) powder Take 17 g by mouth daily.    . potassium chloride (K-DUR) 10 MEQ tablet Take 1 tablet (10 mEq total) by mouth daily as needed. 30 tablet 3  . rosuvastatin (CRESTOR) 40 MG tablet TAKE 1 TABLET(40 MG) BY MOUTH AT BEDTIME 90 tablet 3  . VICTOZA 18 MG/3ML SOPN Inject 1.8 mg into the skin at bedtime.  5  . warfarin (COUMADIN) 6 MG tablet TAKE AS DIRECTED BY COUMADIN CLINIC 30 tablet 3  . ZETIA 10 MG tablet TAKE 1 TABLET BY MOUTH DAILY 30 tablet 3   No current facility-administered medications for this visit.      Allergies:   Codeine; Penicillins; and Tramadol   Social History:  The patient  reports that she has never smoked. Her smokeless tobacco use includes Snuff. She reports that she does not drink alcohol or use drugs.   Family History:   family history includes Coronary artery disease in her other; Diabetes in her other; Heart disease in her mother; Stroke in her other.    Review of Systems: ROS   PHYSICAL EXAM: VS:  There were no vitals taken for this visit. , BMI There is no height or weight on file to calculate BMI. GEN: Well nourished, well developed, in no acute distress HEENT: normal Neck: no JVD, carotid bruits, or masses Cardiac: RRR; no murmurs, rubs, or gallops,no  edema  Respiratory:  clear to auscultation bilaterally, normal work of breathing GI: soft, nontender, nondistended, + BS MS: no deformity or atrophy Skin: warm and dry, no rash Neuro:  Strength and sensation are intact Psych: euthymic mood, full affect    Recent Labs: 10/08/2016: ALT 16; BUN 23; Creatinine, Ser 1.01; Hemoglobin 10.1; Platelets 293; Potassium 3.7; Sodium 136    Lipid Panel No results found for: CHOL, HDL, LDLCALC, TRIG    Wt Readings from Last 3 Encounters:  10/08/16 116 lb (52.6 kg)  09/09/16 116 lb (52.6 kg)  04/04/16 116 lb 8 oz (52.8 kg)       ASSESSMENT AND PLAN:  No diagnosis found.   Disposition:   F/U  6 months  No orders of the defined types were placed in this encounter.    Signed, Dossie Arbour, M.D., Ph.D. 12/30/2016  [facility] [place], [place] [phone]

## 2016-12-31 ENCOUNTER — Ambulatory Visit: Payer: Medicare Other | Admitting: Cardiovascular Disease

## 2017-01-01 ENCOUNTER — Ambulatory Visit (INDEPENDENT_AMBULATORY_CARE_PROVIDER_SITE_OTHER): Payer: Medicare Other

## 2017-01-01 DIAGNOSIS — I059 Rheumatic mitral valve disease, unspecified: Secondary | ICD-10-CM

## 2017-01-01 DIAGNOSIS — Z9889 Other specified postprocedural states: Secondary | ICD-10-CM

## 2017-01-01 DIAGNOSIS — Z952 Presence of prosthetic heart valve: Secondary | ICD-10-CM | POA: Diagnosis not present

## 2017-01-01 DIAGNOSIS — Z5181 Encounter for therapeutic drug level monitoring: Secondary | ICD-10-CM

## 2017-01-01 LAB — POCT INR: INR: 2.6

## 2017-01-13 NOTE — Progress Notes (Signed)
Cardiology Office Note  Date:  01/14/2017   ID:  Moses Manners, DOB 01-10-1925, MRN [id number]  PCP:  Junius Roads, MD   Chief Complaint  Patient presents with  . other    6 month f/u c/o weakness, unable to sleep may contribute to her weight loss and forgetful/worried about herself and nephews. Meds reviewed verbally with pt.    HPI:  83 -year-old woman with a past medical history of  coronary artery disease,  bypass surgery in 2004 at [facility] with a mechanical mitral valve on warfarin,  hypertension,  hyperlipidemia  who presents for routine followup of her coronary artery disease, MVR. Prior history of severe lower abdominal pain. CTA of the abdomen showed atherosclerosis of aorta, celiac vessels Prior history of nausea  Recent EGD and  found to have esophageal stricture underwent dilatation.  Then developed episode of pneumonia for which she to antibiotic  Admitted to the hospital December 19 nausea, vomiting, weakness Discharged from the hospital 10/09/2016 INR was low and she was receiving Lovenox  Echocardiogram was ordered performed June 2017 showing ejection fraction 40-45%, apical hypokinesis, mechanical mitral valve well-functioning   echocardiogram from April 2015 reviewed with her ejection fraction 45-50%, mild to moderate AI, mild stenosis from bioprosthetic mitral valve  Takes lasix 1 to 2 times per week  Legs are very weak, in the past did not want to work with physical therapy at home Living with family, needing support Denies any leg swelling, shortness of breath, orthostasis Diabetes poorly controlled, sugars running high Family giving insulin and victoza  Prior history of abdominal discomfort, history of nausea  EKG Personally reviewed by myself shows normal sinus rhythm with rate 88 bpm, right bundle branch block.  Other past medical history Prior hospitalization at [place] 01/14/2014 with chest pain. Cardiac catheterization  showed patent grafts, saphenous vein graft to the RCA and OM, patent graft to the mid LAD. She has severe three-vessel disease. Medical management was recommended. In follow-up she was feeling weak. high fall risk, not able to do her ADLs with assistance devices. Needing a walker and full assistance  Previous total cholesterol 178, LDL 78 Previous hemoglobin A1c greater than 8   PMH:   has a past medical history of CHF (congestive heart failure) (HCC); Coronary artery disease; Diabetes mellitus without complication (HCC); Hyperlipidemia; Hypertension; Mitral valve disease; and NSTEMI (non-ST elevated myocardial infarction) (HCC) (01/15/2014).  PSH:    Past Surgical History:  Procedure Laterality Date  . CARDIAC CATHETERIZATION    . CORONARY ARTERY BYPASS GRAFT    . LEFT HEART CATHETERIZATION WITH CORONARY ANGIOGRAM N/A 01/17/2014   Procedure: LEFT HEART CATHETERIZATION WITH CORONARY ANGIOGRAM;  Surgeon: Lennette Bihari, MD;  Location: [facility] CATH LAB;  Service: Cardiovascular;  Laterality: N/A;  . MITRAL VALVE REPLACEMENT      Current Outpatient Prescriptions  Medication Sig Dispense Refill  . amLODipine (NORVASC) 5 MG tablet Take 5 mg by mouth daily.   4  . aspirin 81 MG EC tablet Take 81 mg by mouth daily.      . benazepril (LOTENSIN) 20 MG tablet Take 20 mg by mouth daily.   3  . Cholecalciferol (D-3-5) 5000 UNITS capsule Take 5,000 Units by mouth daily.    Marland Kitchen enoxaparin (LOVENOX) 60 MG/0.6ML injection Inject 0.6 mLs (60 mg total) into the skin daily. 6 Syringe 0  . feeding supplement, ENSURE ENLIVE, (ENSURE ENLIVE) LIQD Take 237 mLs by mouth 2 (two) times daily between meals. 237 mL 12  .  furosemide (LASIX) 20 MG tablet Take 1 tablet (20 mg total) by mouth daily as needed. 30 tablet 3  . guaiFENesin (MUCINEX) 600 MG 12 hr tablet Take 600 mg by mouth 2 (two) times daily as needed for cough or to loosen phlegm.    . insulin detemir (LEVEMIR) 100 UNIT/ML injection Inject 11-14 Units into  the skin daily. Patient currently taking 11 units per day, scheduled to increase 3 units on Friday 10/11/16 until further testing by primary physician.    . insulin lispro (HUMALOG) 100 UNIT/ML injection Inject 4-10 Units into the skin 3 (three) times daily with meals. Patient is using the following sliding scale:  201-250= 4 units 251-300= 6 units 301-350= 8 units 351-400= 10 units    . isosorbide mononitrate (IMDUR) 30 MG 24 hr tablet TAKE 1/2 TABLET BY MOUTH DAILY 45 tablet 3  . ketorolac (TORADOL) 10 MG tablet Take 10 mg by mouth every 6 (six) hours as needed.    Marland Kitchen. levothyroxine (SYNTHROID, LEVOTHROID) 25 MCG tablet Take 25 mcg by mouth daily before breakfast.    . loratadine (CLARITIN) 10 MG tablet Take 10 mg by mouth daily.    . metoprolol succinate (TOPROL-XL) 50 MG 24 hr tablet TAKE 1 1/2 TABLETS BY MOUTH DAILY. TAKE WITH OR IMMEDIATELY FOLLOWING A MEAL 135 tablet 3  . nitroGLYCERIN (NITROSTAT) 0.4 MG SL tablet Place 1 tablet (0.4 mg total) under the tongue every 5 (five) minutes as needed for chest pain. 25 tablet 3  . omeprazole (PRILOSEC OTC) 20 MG tablet Take 20 mg by mouth 2 (two) times daily.    . polyethylene glycol powder (GLYCOLAX/MIRALAX) powder Take 17 g by mouth daily.    . potassium chloride (K-DUR) 10 MEQ tablet Take 1 tablet (10 mEq total) by mouth daily as needed. 30 tablet 3  . rosuvastatin (CRESTOR) 40 MG tablet TAKE 1 TABLET(40 MG) BY MOUTH AT BEDTIME 90 tablet 3  . VICTOZA 18 MG/3ML SOPN Inject 1.8 mg into the skin at bedtime.  5  . warfarin (COUMADIN) 6 MG tablet TAKE AS DIRECTED BY COUMADIN CLINIC 30 tablet 3  . ZETIA 10 MG tablet TAKE 1 TABLET BY MOUTH DAILY 30 tablet 3   No current facility-administered medications for this visit.      Allergies:   Codeine; Penicillins; and Tramadol   Social History:  The patient  reports that she has never smoked. Her smokeless tobacco use includes Snuff. She reports that she does not drink alcohol or use drugs.   Family  History:   family history includes Coronary artery disease in her other; Diabetes in her other; Heart disease in her mother; Stroke in her other.    Review of Systems: Review of Systems  Constitutional: Positive for malaise/fatigue.  Respiratory: Negative.   Cardiovascular: Negative.   Gastrointestinal: Negative.   Musculoskeletal:       Leg weakness  Neurological: Positive for weakness.  Psychiatric/Behavioral: Negative.   All other systems reviewed and are negative.    PHYSICAL EXAM: VS:  BP (!) 106/50 (BP Location: Right Arm, Patient Position: Sitting, Cuff Size: Normal)   Pulse 88   Ht 5' (1.524 m)   Wt 115 lb 4 oz (52.3 kg)   BMI 22.51 kg/m  , BMI Body mass index is 22.51 kg/m. GEN: Thin,  in no acute distress  HEENT: normal  Neck: no JVD, carotid bruits, or masses Cardiac: RRR; no murmurs, rubs, or gallops,no edema  Respiratory:  clear to auscultation bilaterally, normal work of  breathing GI: soft, nontender, nondistended, + BS MS: no deformity or atrophy  Skin: warm and dry, no rash Neuro:  Strength and sensation are intact Psych: euthymic mood, full affect    Recent Labs: 10/08/2016: ALT 16; BUN 23; Creatinine, Ser 1.01; Hemoglobin 10.1; Platelets 293; Potassium 3.7; Sodium 136    Lipid Panel No results found for: CHOL, HDL, LDLCALC, TRIG    Wt Readings from Last 3 Encounters:  01/14/17 115 lb 4 oz (52.3 kg)  10/08/16 116 lb (52.6 kg)  09/09/16 116 lb (52.6 kg)       ASSESSMENT AND PLAN:  Mixed hyperlipidemia - Plan: EKG 12-Lead Encouraged her to stay on her Crestor and Zetia Goal LDL less than 70  Atherosclerosis of coronary artery bypass graft of native heart with stable angina pectoris (HCC) - Plan: EKG 12-Lead Currently with no symptoms of angina. No further workup at this time. Continue current medication regimen.  Essential hypertension - Plan: EKG 12-Lead Blood pressure is well controlled on today's visit. No changes made to the  medications.  MITRAL VALVE REPLACEMENT, HX OF, 2004, 27 mmmechanical valve - Plan: EKG 12-Lead Therapeutic on her warfarin Last echocardiogram 2017 with well-functioning mitral valve  Lower abdominal pain - Plan: EKG 12-Lead No significant symptoms on today's visit  Nausea - Plan: EKG 12-Lead  SOB (shortness of breath) - Plan: EKG 12-Lead Chronic shortness of breath on exertion, likely from deconditioning Minimal walking at home, needs physical therapy for leg weakness  Encounter for anticoagulation discussion and counseling - Plan: EKG 12-Lead Tolerating warfarin, no recent falls   Total encounter time more than 25 minutes  Greater than 50% was spent in counseling and coordination of care with the patient   Disposition:   F/U  6 months   Orders Placed This Encounter  Procedures  . EKG 12-Lead     Signed, Dossie Arbour, M.D., Ph.D. 01/14/2017  [facility] [place], [place] [phone]

## 2017-01-14 ENCOUNTER — Ambulatory Visit (INDEPENDENT_AMBULATORY_CARE_PROVIDER_SITE_OTHER): Payer: Medicare Other | Admitting: Cardiovascular Disease

## 2017-01-14 ENCOUNTER — Encounter: Payer: Self-pay | Admitting: Cardiovascular Disease

## 2017-01-14 VITALS — BP 106/50 | HR 88 | Ht 60.0 in | Wt 115.2 lb

## 2017-01-14 DIAGNOSIS — Z9889 Other specified postprocedural states: Secondary | ICD-10-CM | POA: Diagnosis not present

## 2017-01-14 DIAGNOSIS — R1030 Lower abdominal pain, unspecified: Secondary | ICD-10-CM | POA: Diagnosis not present

## 2017-01-14 DIAGNOSIS — R110 Nausea: Secondary | ICD-10-CM | POA: Diagnosis not present

## 2017-01-14 DIAGNOSIS — E782 Mixed hyperlipidemia: Secondary | ICD-10-CM

## 2017-01-14 DIAGNOSIS — I059 Rheumatic mitral valve disease, unspecified: Secondary | ICD-10-CM | POA: Diagnosis not present

## 2017-01-14 DIAGNOSIS — R0602 Shortness of breath: Secondary | ICD-10-CM

## 2017-01-14 DIAGNOSIS — I10 Essential (primary) hypertension: Secondary | ICD-10-CM

## 2017-01-14 DIAGNOSIS — I25708 Atherosclerosis of coronary artery bypass graft(s), unspecified, with other forms of angina pectoris: Secondary | ICD-10-CM | POA: Diagnosis not present

## 2017-01-14 DIAGNOSIS — Z7189 Other specified counseling: Secondary | ICD-10-CM | POA: Diagnosis not present

## 2017-01-14 NOTE — Patient Instructions (Addendum)
Medication Instructions:   No medication changes made  You do not need lovenox injection when INR >2.5  Labwork:  No new labs needed  Testing/Procedures:  No further testing at this time   I recommend watching educational videos on topics of interest to you at:       [email or web address]  Enter code: [email or web address]    Follow-Up: It was a pleasure seeing you in the office today. Please call us if you have new issues that need to be addressed before your next appt.  [phone]  Your physician wants you to follow-up in: 6 months.  You will receive a reminder letter in the mail two months in advance. If you don't receive a letter, please call our office to schedule the follow-up appointment.  If you need a refill on your cardiac medications before your next appointment, please call your pharmacy.

## 2017-01-19 ENCOUNTER — Other Ambulatory Visit: Payer: Self-pay | Admitting: Cardiovascular Disease

## 2017-01-29 ENCOUNTER — Ambulatory Visit (INDEPENDENT_AMBULATORY_CARE_PROVIDER_SITE_OTHER): Payer: Medicare Other

## 2017-01-29 DIAGNOSIS — Z9889 Other specified postprocedural states: Secondary | ICD-10-CM

## 2017-01-29 DIAGNOSIS — Z952 Presence of prosthetic heart valve: Secondary | ICD-10-CM

## 2017-01-29 DIAGNOSIS — Z5181 Encounter for therapeutic drug level monitoring: Secondary | ICD-10-CM | POA: Diagnosis not present

## 2017-01-29 DIAGNOSIS — I059 Rheumatic mitral valve disease, unspecified: Secondary | ICD-10-CM | POA: Diagnosis not present

## 2017-01-29 LAB — POCT INR: INR: 1.3

## 2017-02-12 ENCOUNTER — Ambulatory Visit (INDEPENDENT_AMBULATORY_CARE_PROVIDER_SITE_OTHER): Payer: Medicare Other

## 2017-02-12 DIAGNOSIS — Z9889 Other specified postprocedural states: Secondary | ICD-10-CM

## 2017-02-12 DIAGNOSIS — Z952 Presence of prosthetic heart valve: Secondary | ICD-10-CM | POA: Diagnosis not present

## 2017-02-12 DIAGNOSIS — I059 Rheumatic mitral valve disease, unspecified: Secondary | ICD-10-CM | POA: Diagnosis not present

## 2017-02-12 DIAGNOSIS — Z5181 Encounter for therapeutic drug level monitoring: Secondary | ICD-10-CM

## 2017-02-12 LAB — POCT INR: INR: 2.4

## 2017-03-12 ENCOUNTER — Ambulatory Visit (INDEPENDENT_AMBULATORY_CARE_PROVIDER_SITE_OTHER): Payer: Medicare Other

## 2017-03-12 DIAGNOSIS — Z952 Presence of prosthetic heart valve: Secondary | ICD-10-CM | POA: Diagnosis not present

## 2017-03-12 DIAGNOSIS — Z5181 Encounter for therapeutic drug level monitoring: Secondary | ICD-10-CM | POA: Diagnosis not present

## 2017-03-12 DIAGNOSIS — I059 Rheumatic mitral valve disease, unspecified: Secondary | ICD-10-CM

## 2017-03-12 DIAGNOSIS — Z9889 Other specified postprocedural states: Secondary | ICD-10-CM

## 2017-03-12 LAB — POCT INR: INR: 2.3

## 2017-03-25 ENCOUNTER — Other Ambulatory Visit: Payer: Self-pay | Admitting: Cardiovascular Disease

## 2017-04-09 ENCOUNTER — Ambulatory Visit (INDEPENDENT_AMBULATORY_CARE_PROVIDER_SITE_OTHER): Payer: Medicare Other | Admitting: *Deleted

## 2017-04-09 DIAGNOSIS — I059 Rheumatic mitral valve disease, unspecified: Secondary | ICD-10-CM | POA: Diagnosis not present

## 2017-04-09 DIAGNOSIS — Z5181 Encounter for therapeutic drug level monitoring: Secondary | ICD-10-CM | POA: Diagnosis not present

## 2017-04-09 DIAGNOSIS — Z952 Presence of prosthetic heart valve: Secondary | ICD-10-CM

## 2017-04-09 DIAGNOSIS — Z9889 Other specified postprocedural states: Secondary | ICD-10-CM | POA: Diagnosis not present

## 2017-04-09 LAB — POCT INR: INR: 2.8

## 2017-04-29 ENCOUNTER — Other Ambulatory Visit: Payer: Self-pay

## 2017-04-29 MED ORDER — WARFARIN SODIUM 6 MG PO TABS: ORAL_TABLET | ORAL | 3 refills | Status: DC

## 2017-05-04 ENCOUNTER — Other Ambulatory Visit: Payer: Self-pay | Admitting: Cardiovascular Disease

## 2017-05-07 LAB — POCT INR: INR: 1.6

## 2017-05-09 ENCOUNTER — Ambulatory Visit (INDEPENDENT_AMBULATORY_CARE_PROVIDER_SITE_OTHER): Payer: Medicare Other | Admitting: Internal Medicine

## 2017-05-09 DIAGNOSIS — Z5181 Encounter for therapeutic drug level monitoring: Secondary | ICD-10-CM

## 2017-05-09 DIAGNOSIS — Z9889 Other specified postprocedural states: Secondary | ICD-10-CM

## 2017-05-09 DIAGNOSIS — I059 Rheumatic mitral valve disease, unspecified: Secondary | ICD-10-CM

## 2017-05-19 ENCOUNTER — Other Ambulatory Visit: Payer: Self-pay | Admitting: Cardiovascular Disease

## 2017-05-21 ENCOUNTER — Ambulatory Visit (INDEPENDENT_AMBULATORY_CARE_PROVIDER_SITE_OTHER): Payer: Medicare Other

## 2017-05-21 DIAGNOSIS — Z9889 Other specified postprocedural states: Secondary | ICD-10-CM

## 2017-05-21 DIAGNOSIS — Z5181 Encounter for therapeutic drug level monitoring: Secondary | ICD-10-CM

## 2017-05-21 DIAGNOSIS — I059 Rheumatic mitral valve disease, unspecified: Secondary | ICD-10-CM

## 2017-05-21 LAB — POCT INR: INR: 1.7

## 2017-06-04 ENCOUNTER — Ambulatory Visit (INDEPENDENT_AMBULATORY_CARE_PROVIDER_SITE_OTHER): Payer: Medicare Other

## 2017-06-04 DIAGNOSIS — Z9889 Other specified postprocedural states: Secondary | ICD-10-CM

## 2017-06-04 DIAGNOSIS — I059 Rheumatic mitral valve disease, unspecified: Secondary | ICD-10-CM | POA: Diagnosis not present

## 2017-06-04 DIAGNOSIS — Z5181 Encounter for therapeutic drug level monitoring: Secondary | ICD-10-CM

## 2017-06-04 LAB — POCT INR: INR: 2.8

## 2017-06-25 ENCOUNTER — Ambulatory Visit (INDEPENDENT_AMBULATORY_CARE_PROVIDER_SITE_OTHER): Payer: Medicare Other

## 2017-06-25 DIAGNOSIS — Z9889 Other specified postprocedural states: Secondary | ICD-10-CM

## 2017-06-25 DIAGNOSIS — Z5181 Encounter for therapeutic drug level monitoring: Secondary | ICD-10-CM | POA: Diagnosis not present

## 2017-06-25 DIAGNOSIS — I059 Rheumatic mitral valve disease, unspecified: Secondary | ICD-10-CM | POA: Diagnosis not present

## 2017-06-25 LAB — POCT INR: INR: 2

## 2017-07-23 ENCOUNTER — Ambulatory Visit (INDEPENDENT_AMBULATORY_CARE_PROVIDER_SITE_OTHER): Payer: Medicare Other

## 2017-07-23 DIAGNOSIS — Z5181 Encounter for therapeutic drug level monitoring: Secondary | ICD-10-CM | POA: Diagnosis not present

## 2017-07-23 DIAGNOSIS — Z9889 Other specified postprocedural states: Secondary | ICD-10-CM | POA: Diagnosis not present

## 2017-07-23 DIAGNOSIS — I059 Rheumatic mitral valve disease, unspecified: Secondary | ICD-10-CM

## 2017-07-23 LAB — POCT INR: INR: 3.2

## 2017-08-11 NOTE — Progress Notes (Signed)
Cardiology Office Note  Date:  08/15/2017   ID:  Amanda [name], DOB 21-Dec-1924, MRN [id number]  PCP:  Tenna Delaine, FNP   Chief Complaint  Patient presents with  . other    6 month follow up. Patient c/o SOB and blood vessels in eyes popping. Meds reviewed verbally with patient.     HPI:  83 -year-old woman with a past medical history of  coronary artery disease,  bypass surgery in 2004 at [facility] with a mechanical mitral valve on warfarin,  hypertension,  hyperlipidemia  Prior history of severe lower abdominal pain, nausea CTA of the abdomen showed atherosclerosis of aorta, celiac vessels Prior history of nausea esophageal stricture underwent dilatation.  who presents for routine followup of her coronary artery disease, MVR.  In follow-up today she presents with family She reports having cough and tired past day or two Denies sore throat, fever, malaise Typically takes Lasix twice a week, missed her dose yesterday as she was shopping No sick contact exposure She does have prior history of pneumonia, daughter is worried this may turn into something But overall feels a little bit weak today  Denies any chest pain concerning for angina INR 1.9, goal is 2.5-3.5 Initial blood pressure low, up to 115 systolic on my recheck  EKG personally reviewed by myself on todays visit Shows normal sinus rhythm rate 86 bpm right bundle branch block  Other past medical history reviewed  pneumonia, Admitted to the hospital December 19 nausea, vomiting, weakness Discharged from the hospital 10/09/2016 INR was low and she was receiving Lovenox  Echocardiogram was ordered performed June 2017 showing ejection fraction 40-45%, apical hypokinesis, mechanical mitral valve well-functioning   echocardiogram from April 2015 reviewed with her ejection fraction 45-50%, mild to moderate AI, mild stenosis from bioprosthetic mitral valve  Prior hospitalization at [place]  01/14/2014 with chest pain. Cardiac catheterization showed patent grafts, saphenous vein graft to the RCA and OM, patent graft to the mid LAD. She has severe three-vessel disease. Medical management was recommended. In follow-up she was feeling weak. high fall risk, not able to do her ADLs with assistance devices. Needing a walker and full assistance  Previous total cholesterol 178, LDL 78 Previous hemoglobin A1c greater than 8   PMH:   has a past medical history of CHF (congestive heart failure) (HCC); Coronary artery disease; Diabetes mellitus without complication (HCC); Hyperlipidemia; Hypertension; Mitral valve disease; and NSTEMI (non-ST elevated myocardial infarction) (HCC) (01/15/2014).  PSH:    Past Surgical History:  Procedure Laterality Date  . CARDIAC CATHETERIZATION    . CORONARY ARTERY BYPASS GRAFT    . LEFT HEART CATHETERIZATION WITH CORONARY ANGIOGRAM N/A 01/17/2014   Procedure: LEFT HEART CATHETERIZATION WITH CORONARY ANGIOGRAM;  Surgeon: Lennette Bihari, MD;  Location[facility] CATH LAB;  Service: Cardiovascular;  Laterality: N/A;  . MITRAL VALVE REPLACEMENT      Current Outpatient Prescriptions  Medication Sig Dispense Refill  . amLODipine (NORVASC) 5 MG tablet Take 5 mg by mouth daily.   4  . aspirin 81 MG EC tablet Take 81 mg by mouth daily.      . benazepril (LOTENSIN) 20 MG tablet Take 20 mg by mouth daily.   3  . Cholecalciferol (D-3-5) 5000 UNITS capsule Take 5,000 Units by mouth daily.    Marland Kitchen enoxaparin (LOVENOX) 60 MG/0.6ML injection Inject 0.6 mLs (60 mg total) into the skin daily. 6 Syringe 0  . ezetimibe (ZETIA) 10 MG tablet TAKE 1 TABLET BY MOUTH  DAILY 30 tablet 3  . feeding supplement, ENSURE ENLIVE, (ENSURE ENLIVE) LIQD Take 237 mLs by mouth 2 (two) times daily between meals. 237 mL 12  . furosemide (LASIX) 20 MG tablet TAKE 1 TABLET(20 MG) BY MOUTH DAILY AS NEEDED 30 tablet 3  . guaiFENesin (MUCINEX) 600 MG 12 hr tablet Take 600 mg by mouth 2 (two) times daily  as needed for cough or to loosen phlegm.    . insulin detemir (LEVEMIR) 100 UNIT/ML injection Inject 11-14 Units into the skin daily. Patient currently taking 11 units per day, scheduled to increase 3 units on Friday 10/11/16 until further testing by primary physician.    . insulin lispro (HUMALOG) 100 UNIT/ML injection Inject 4-10 Units into the skin 3 (three) times daily with meals. Patient is using the following sliding scale:  201-250= 4 units 251-300= 6 units 301-350= 8 units 351-400= 10 units    . isosorbide mononitrate (IMDUR) 30 MG 24 hr tablet TAKE 1/2 TABLET BY MOUTH DAILY 45 tablet 3  . ketorolac (TORADOL) 10 MG tablet Take 10 mg by mouth every 6 (six) hours as needed.    Marland Kitchen levothyroxine (SYNTHROID, LEVOTHROID) 25 MCG tablet Take 25 mcg by mouth daily before breakfast.    . loratadine (CLARITIN) 10 MG tablet Take 10 mg by mouth daily.    . metoprolol succinate (TOPROL-XL) 50 MG 24 hr tablet TAKE 1 1/2 TABLETS BY MOUTH DAILY. TAKE WITH OR IMMEDIATELY FOLLOWING A MEAL 135 tablet 3  . nitroGLYCERIN (NITROSTAT) 0.4 MG SL tablet Place 1 tablet (0.4 mg total) under the tongue every 5 (five) minutes as needed for chest pain. 25 tablet 3  . omeprazole (PRILOSEC OTC) 20 MG tablet Take 20 mg by mouth 2 (two) times daily.    . polyethylene glycol powder (GLYCOLAX/MIRALAX) powder Take 17 g by mouth daily.    . potassium chloride (K-DUR) 10 MEQ tablet TAKE 1 TABLET(10 MEQ) BY MOUTH DAILY AS NEEDED 30 tablet 3  . rosuvastatin (CRESTOR) 40 MG tablet TAKE 1 TABLET(40 MG) BY MOUTH AT BEDTIME 90 tablet 3  . VICTOZA 18 MG/3ML SOPN Inject 1.8 mg into the skin at bedtime.  5  . warfarin (COUMADIN) 6 MG tablet TAKE AS DIRECTED BY COUMADIN CLINIC 30 tablet 3   No current facility-administered medications for this visit.      Allergies:   Codeine; Penicillins; and Tramadol   Social History:  The patient  reports that she has never smoked. Her smokeless tobacco use includes Snuff. She reports that she  does not drink alcohol or use drugs.   Family History:   family history includes Coronary artery disease in her other; Diabetes in her other; Heart disease in her mother; Stroke in her other.    Review of Systems: Review of Systems  Constitutional: Positive for malaise/fatigue.  Respiratory: Positive for cough.   Cardiovascular: Negative.   Gastrointestinal: Negative.   Musculoskeletal:       Leg weakness  Psychiatric/Behavioral: Negative.   All other systems reviewed and are negative.    PHYSICAL EXAM: VS:  BP (!) 98/50 (BP Location: Right Arm, Patient Position: Sitting, Cuff Size: Normal)   Pulse 86   Ht 5\' 4"  (1.626 m)   Wt 116 lb (52.6 kg)   BMI 19.91 kg/m  , BMI Body mass index is 19.91 kg/m. GEN: Thin,  in no acute distress  HEENT: normal  Neck: no JVD, carotid bruits, or masses Cardiac: RRR; no murmurs, rubs, or gallops,no edema  Respiratory:  clear  to auscultation bilaterally, normal work of breathing GI: soft, nontender, nondistended, + BS MS: no deformity or atrophy  Skin: warm and dry, no rash Neuro:  Strength and sensation are intact Psych: euthymic mood, full affect    Recent Labs: 10/08/2016: ALT 16; BUN 23; Creatinine, Ser 1.01; Hemoglobin 10.1; Platelets 293; Potassium 3.7; Sodium 136    Lipid Panel No results found for: CHOL, HDL, LDLCALC, TRIG    Wt Readings from Last 3 Encounters:  08/15/17 116 lb (52.6 kg)  01/14/17 115 lb 4 oz (52.3 kg)  10/08/16 116 lb (52.6 kg)       ASSESSMENT AND PLAN:  Mixed hyperlipidemia - Plan: EKG 12-Lead Encouraged her to stay on her Crestor and Zetia Goal LDL less than 70, stable  Atherosclerosis of coronary artery bypass graft of native heart with stable angina pectoris (HCC) - Plan: EKG 12-Lead Denies any chest pain symptoms concerning for angina No ischemic testing at this time  Essential hypertension - Plan: EKG 12-Lead Blood pressure is well controlled on today's visit. No changes made to the  medications.  MITRAL VALVE REPLACEMENT, HX OF, 2004, 27 mmmechanical valve - Plan: EKG 12-Lead INR subtherapeutic.  Warfarin adjusted Last echocardiogram 2017 with well-functioning mitral valve  Lower abdominal pain - Plan: EKG 12-Lead No significant symptoms on today's visit Prior history of chronic pain and nausea  SOB (shortness of breath) - Plan: EKG 12-Lead Chronic shortness of breath on exertion, likely from deconditioning Minimal walking at home,  She reports cough yesterday today Suspect viral URI, though recommended she try Lasix 2 days in a row Grossly no signs of fluid overload, no leg edema or abdominal bloating, no weight gain  Encounter for anticoagulation discussion and counseling - Plan: EKG 12-Lead Tolerating warfarin, no recent falls  Warfarin adjusted   Total encounter time more than 25 minutes  Greater than 50% was spent in counseling and coordination of care with the patient   Disposition:   F/U  6 months   No orders of the defined types were placed in this encounter.    Signed, Dossie Arbourim Hilberto Burzynski, M.D., Ph.D. 08/15/2017  [facility], [place] [phone]

## 2017-08-15 ENCOUNTER — Ambulatory Visit (INDEPENDENT_AMBULATORY_CARE_PROVIDER_SITE_OTHER): Payer: Medicare Other

## 2017-08-15 ENCOUNTER — Encounter: Payer: Self-pay | Admitting: Cardiovascular Disease

## 2017-08-15 ENCOUNTER — Ambulatory Visit (INDEPENDENT_AMBULATORY_CARE_PROVIDER_SITE_OTHER): Payer: Medicare Other | Admitting: Cardiovascular Disease

## 2017-08-15 VITALS — BP 98/50 | HR 86 | Ht 64.0 in | Wt 116.0 lb

## 2017-08-15 DIAGNOSIS — Z23 Encounter for immunization: Secondary | ICD-10-CM

## 2017-08-15 DIAGNOSIS — Z5181 Encounter for therapeutic drug level monitoring: Secondary | ICD-10-CM

## 2017-08-15 DIAGNOSIS — I059 Rheumatic mitral valve disease, unspecified: Secondary | ICD-10-CM | POA: Diagnosis not present

## 2017-08-15 DIAGNOSIS — I10 Essential (primary) hypertension: Secondary | ICD-10-CM

## 2017-08-15 DIAGNOSIS — E782 Mixed hyperlipidemia: Secondary | ICD-10-CM

## 2017-08-15 DIAGNOSIS — E1159 Type 2 diabetes mellitus with other circulatory complications: Secondary | ICD-10-CM | POA: Diagnosis not present

## 2017-08-15 DIAGNOSIS — Z7189 Other specified counseling: Secondary | ICD-10-CM

## 2017-08-15 DIAGNOSIS — I214 Non-ST elevation (NSTEMI) myocardial infarction: Secondary | ICD-10-CM

## 2017-08-15 DIAGNOSIS — Z9889 Other specified postprocedural states: Secondary | ICD-10-CM

## 2017-08-15 DIAGNOSIS — I25708 Atherosclerosis of coronary artery bypass graft(s), unspecified, with other forms of angina pectoris: Secondary | ICD-10-CM | POA: Diagnosis not present

## 2017-08-15 DIAGNOSIS — E43 Unspecified severe protein-calorie malnutrition: Secondary | ICD-10-CM | POA: Diagnosis not present

## 2017-08-15 LAB — POCT INR: INR: 1.9

## 2017-08-15 NOTE — Patient Instructions (Addendum)
Medication Instructions:   Please hold the amlodipine Please monitor blood rpessure  Flu shot  Labwork:  No new labs needed  Testing/Procedures:  No further testing at this time   Follow-Up: It was a pleasure seeing you in the office today. Please call us if you have new issues that need to be addressed before your next appt.  [phone]  Your physician wants you to follow-up in: 6 months.  You will receive a reminder letter in the mail two months in advance. If you don't receive a letter, please call our office to schedule the follow-up appointment.  If you need a refill on your cardiac medications before your next appointment, please call your pharmacy.

## 2017-08-27 ENCOUNTER — Ambulatory Visit (INDEPENDENT_AMBULATORY_CARE_PROVIDER_SITE_OTHER): Payer: Medicare Other

## 2017-08-27 DIAGNOSIS — Z9889 Other specified postprocedural states: Secondary | ICD-10-CM

## 2017-08-27 DIAGNOSIS — Z5181 Encounter for therapeutic drug level monitoring: Secondary | ICD-10-CM | POA: Diagnosis not present

## 2017-08-27 DIAGNOSIS — I059 Rheumatic mitral valve disease, unspecified: Secondary | ICD-10-CM | POA: Diagnosis not present

## 2017-08-27 LAB — POCT INR: INR: 2.6

## 2017-09-07 IMAGING — DX DG CHEST 1V PORT
1 series · 1 of 1 positions shown · non-contrast
Comparison: Report of a chest x-ray April 04, 2003 and limited
fluoro spot images December 23, 2003.

CLINICAL DATA: Awakened this morning with blood coming out the left
year as well as the headache. 2-3 days of confusion with difficulty
swallowing. History of CHF, previous MI, valve replacement, CABG.

EXAM:
PORTABLE CHEST 1 VIEW

[chest ap]
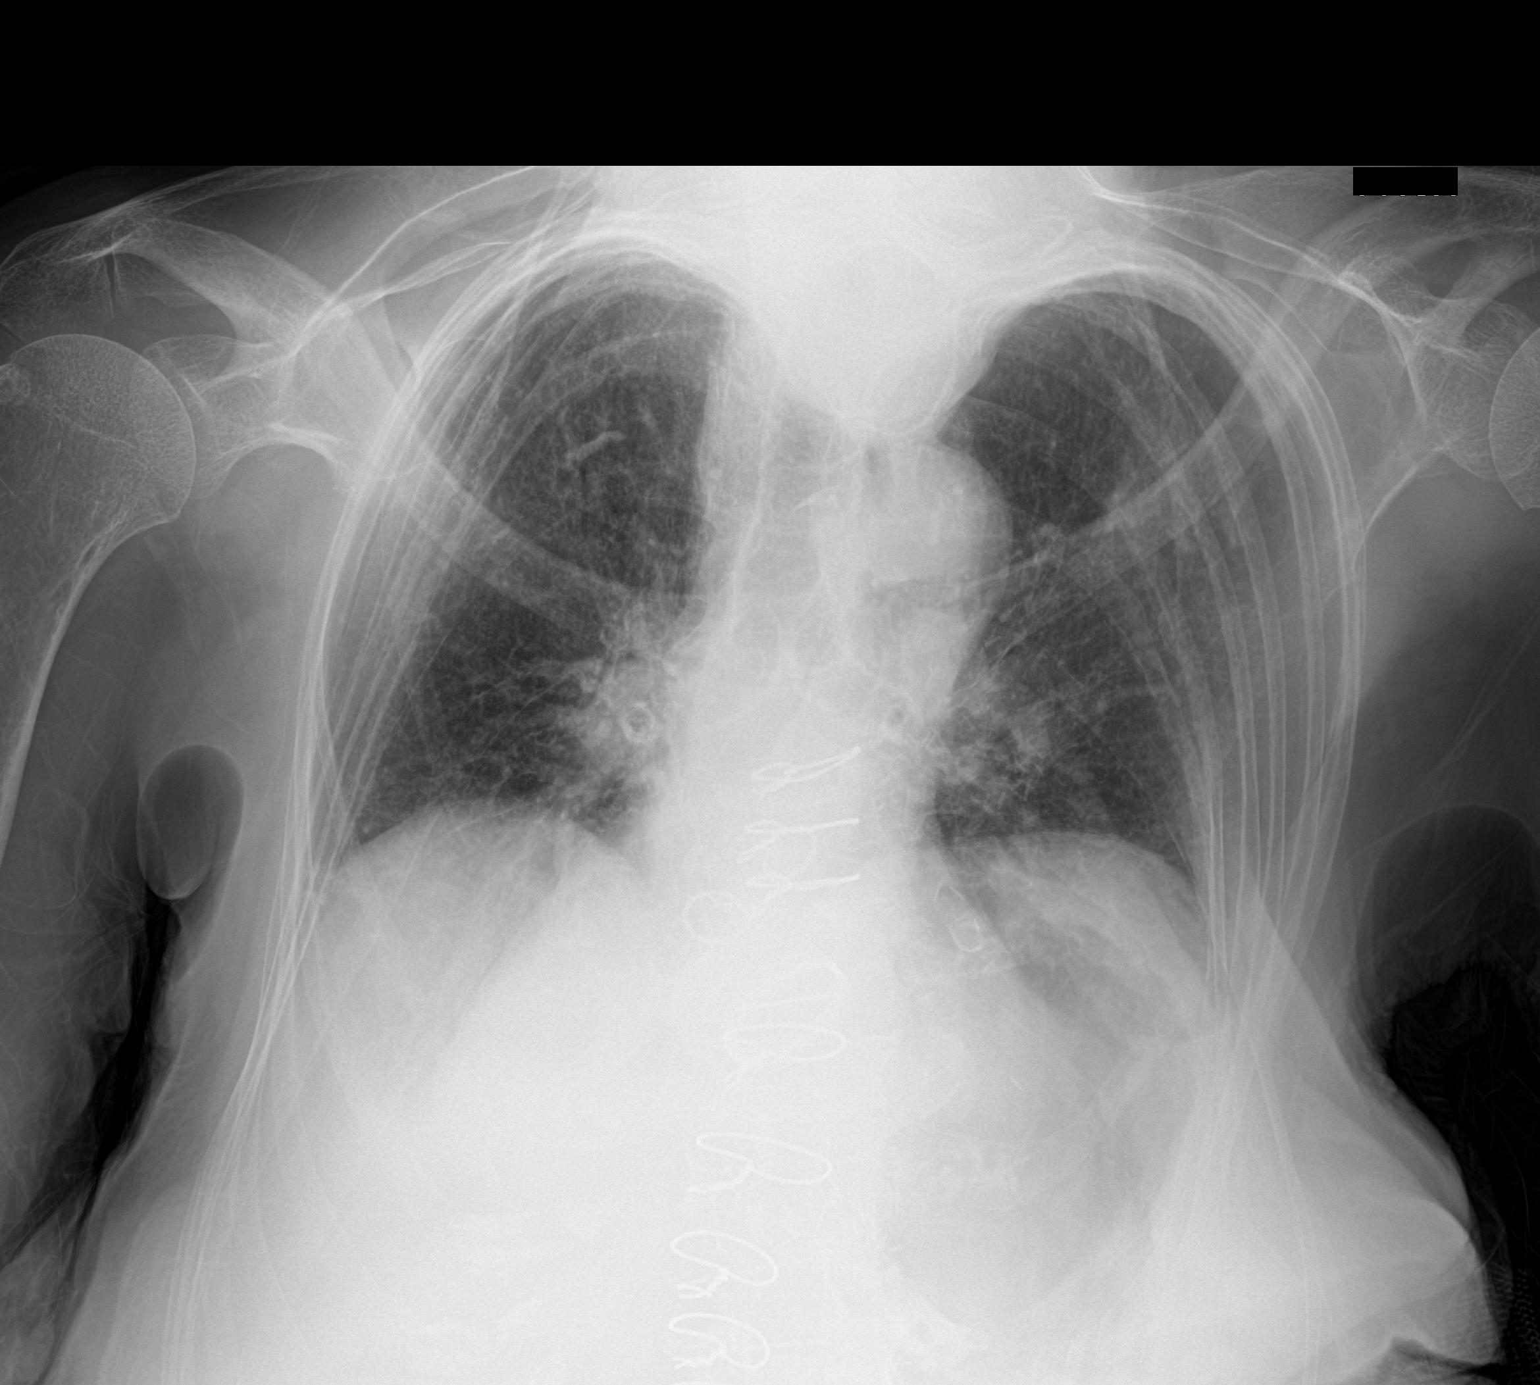

[1 of 1 positions shown; findings below may reference images not displayed]

FINDINGS: There is severe thoracic kyphosis. The lungs appear reasonably well
inflated and clear. The heart and pulmonary vascularity are normal.
There is no definite pleural effusion or pneumothorax. The
mediastinum is normal in width. There is calcification in the wall
of the aortic arch.
IMPRESSION: No definite acute cardiopulmonary abnormality.

Thoracic aortic atherosclerosis.

## 2017-09-15 ENCOUNTER — Other Ambulatory Visit: Payer: Self-pay | Admitting: Cardiovascular Disease

## 2017-09-22 ENCOUNTER — Other Ambulatory Visit: Payer: Self-pay | Admitting: Cardiovascular Disease

## 2017-09-23 ENCOUNTER — Other Ambulatory Visit: Payer: Self-pay

## 2017-09-23 MED ORDER — WARFARIN SODIUM 6 MG PO TABS: ORAL_TABLET | ORAL | 3 refills | Status: DC

## 2017-09-24 ENCOUNTER — Ambulatory Visit (INDEPENDENT_AMBULATORY_CARE_PROVIDER_SITE_OTHER): Payer: Medicare Other

## 2017-09-24 DIAGNOSIS — Z9889 Other specified postprocedural states: Secondary | ICD-10-CM | POA: Diagnosis not present

## 2017-09-24 DIAGNOSIS — I059 Rheumatic mitral valve disease, unspecified: Secondary | ICD-10-CM | POA: Diagnosis not present

## 2017-09-24 DIAGNOSIS — Z5181 Encounter for therapeutic drug level monitoring: Secondary | ICD-10-CM

## 2017-09-24 LAB — POCT INR: INR: 3.9

## 2017-09-24 NOTE — Patient Instructions (Signed)
Skip coumadin tonight, then resume dosage of 1 tablet every day, except 1/2 tablet on Tuesdays, Thursdays & Saturdays.    Recheck in 4 weeks.

## 2017-10-06 IMAGING — CR DG CHEST 2V
1 series · 2 of 2 positions shown · non-contrast
Comparison: Single-view of the chest 09/09/2016

CLINICAL DATA: Nausea, vomiting, shortness of breath and weakness
for 1 week.

EXAM:
CHEST  2 VIEW

[Series 1: dg chest 2 view · 0.14mm/px · 2 of 2 slices shown]
[im 1/2]
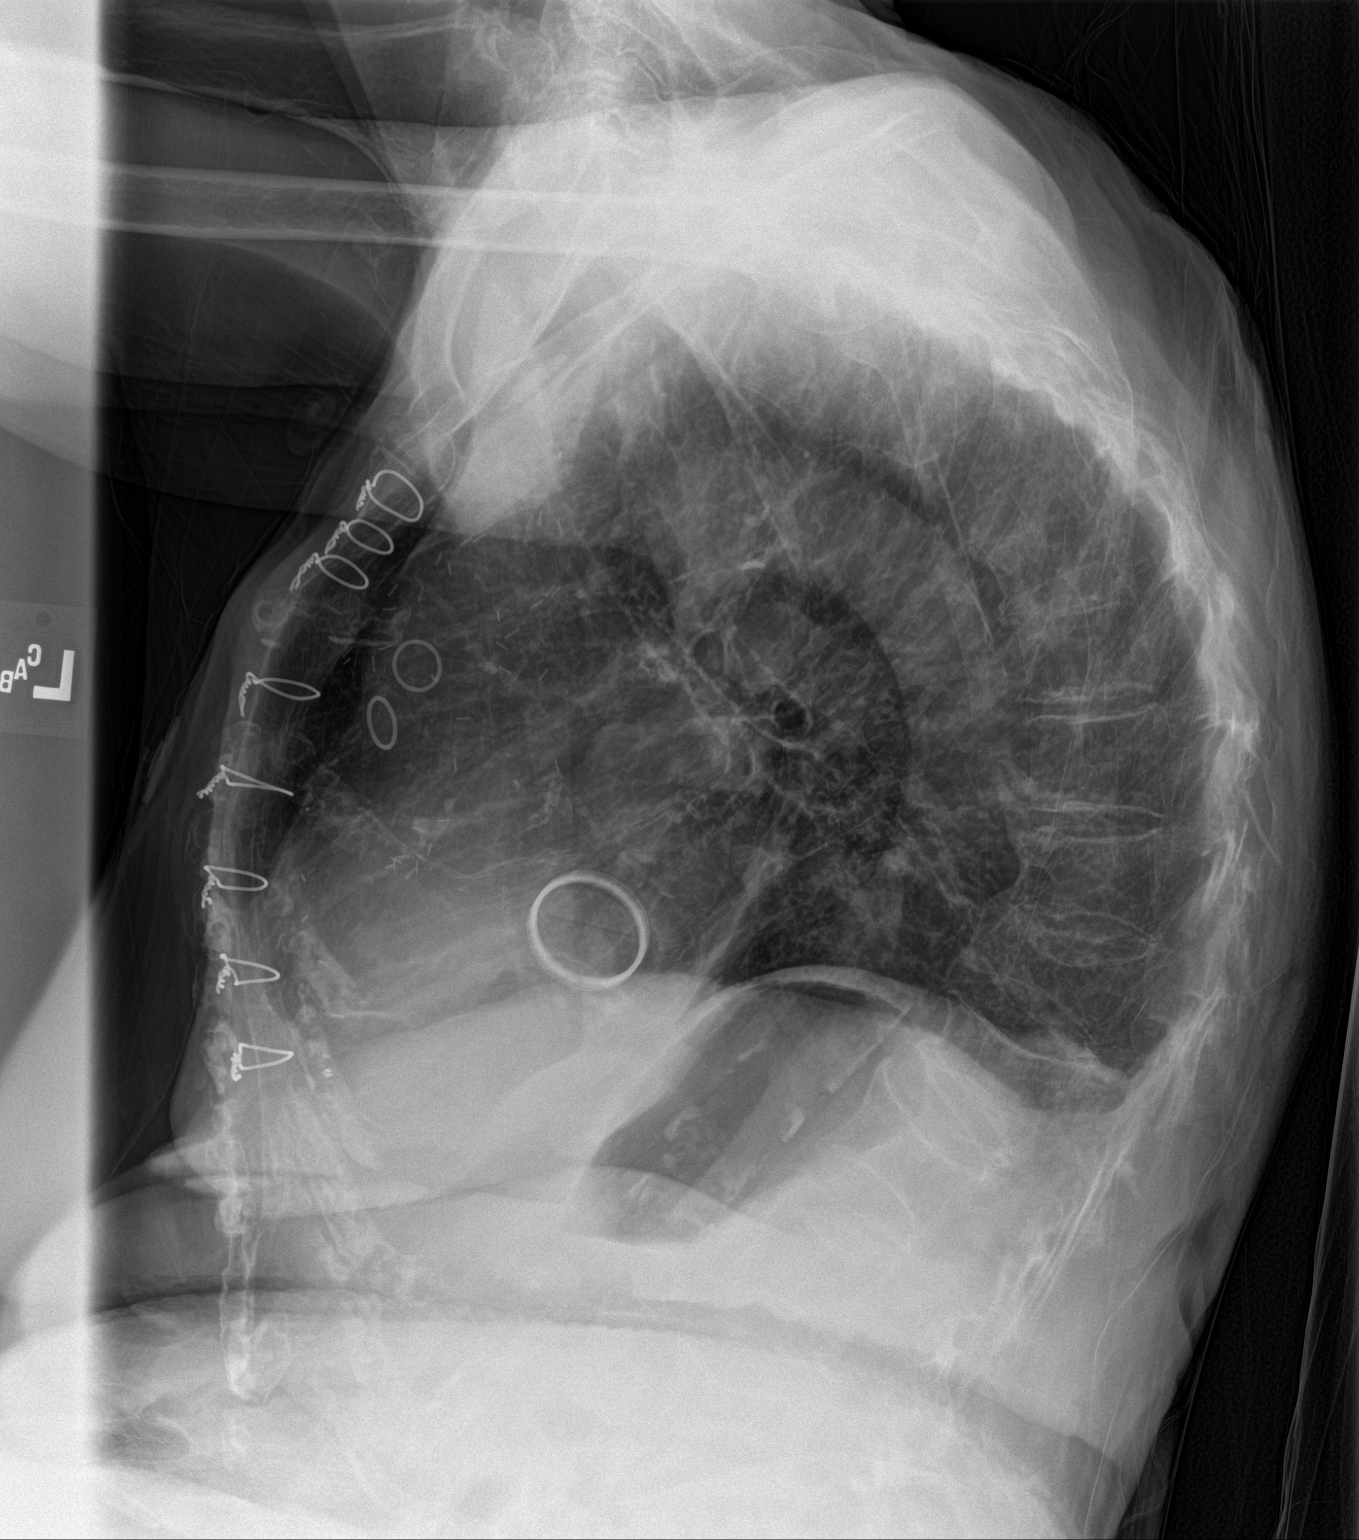
[im 2/2]
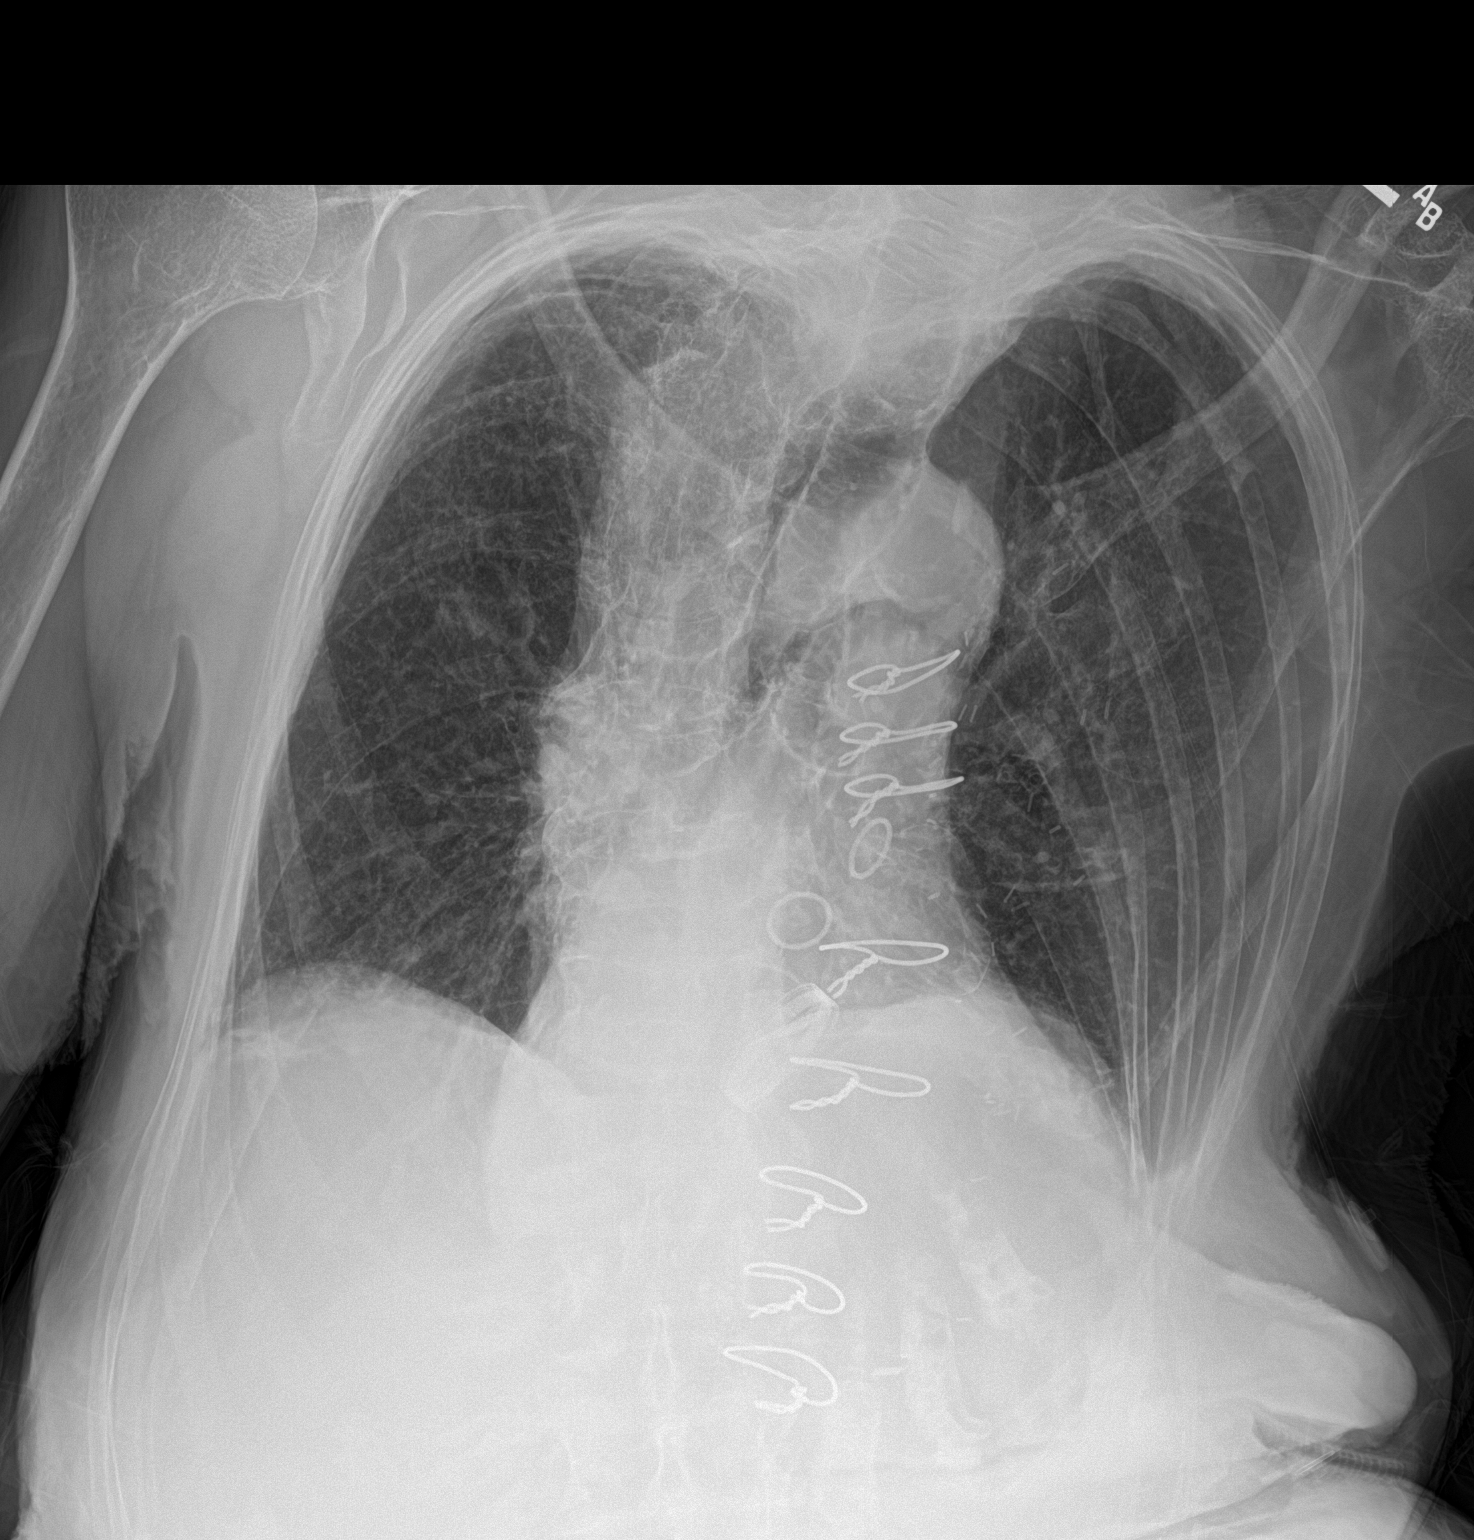

[2 of 2 positions shown; findings below may reference images not displayed]

FINDINGS: There is a small left pleural effusion. No right effusion. Lungs
appear clear. Heart size is enlarged. The patient is status post
CABG. Aortic atherosclerosis is noted. The patient has lower
thoracic compression fractures which appear remote.
IMPRESSION: Small left pleural effusion.

Cardiomegaly without edema.

Atherosclerosis.

## 2017-10-06 IMAGING — CR DG ABDOMEN 2V
1 series · 2 of 2 positions shown · non-contrast
Comparison: Radiograph December 21, 2003.

CLINICAL DATA: Nausea, vomiting.

EXAM:
ABDOMEN - 2 VIEW

[Series 1: dg abd 2 views · 0.14mm/px · 2 of 2 slices shown]
[im 1/2]
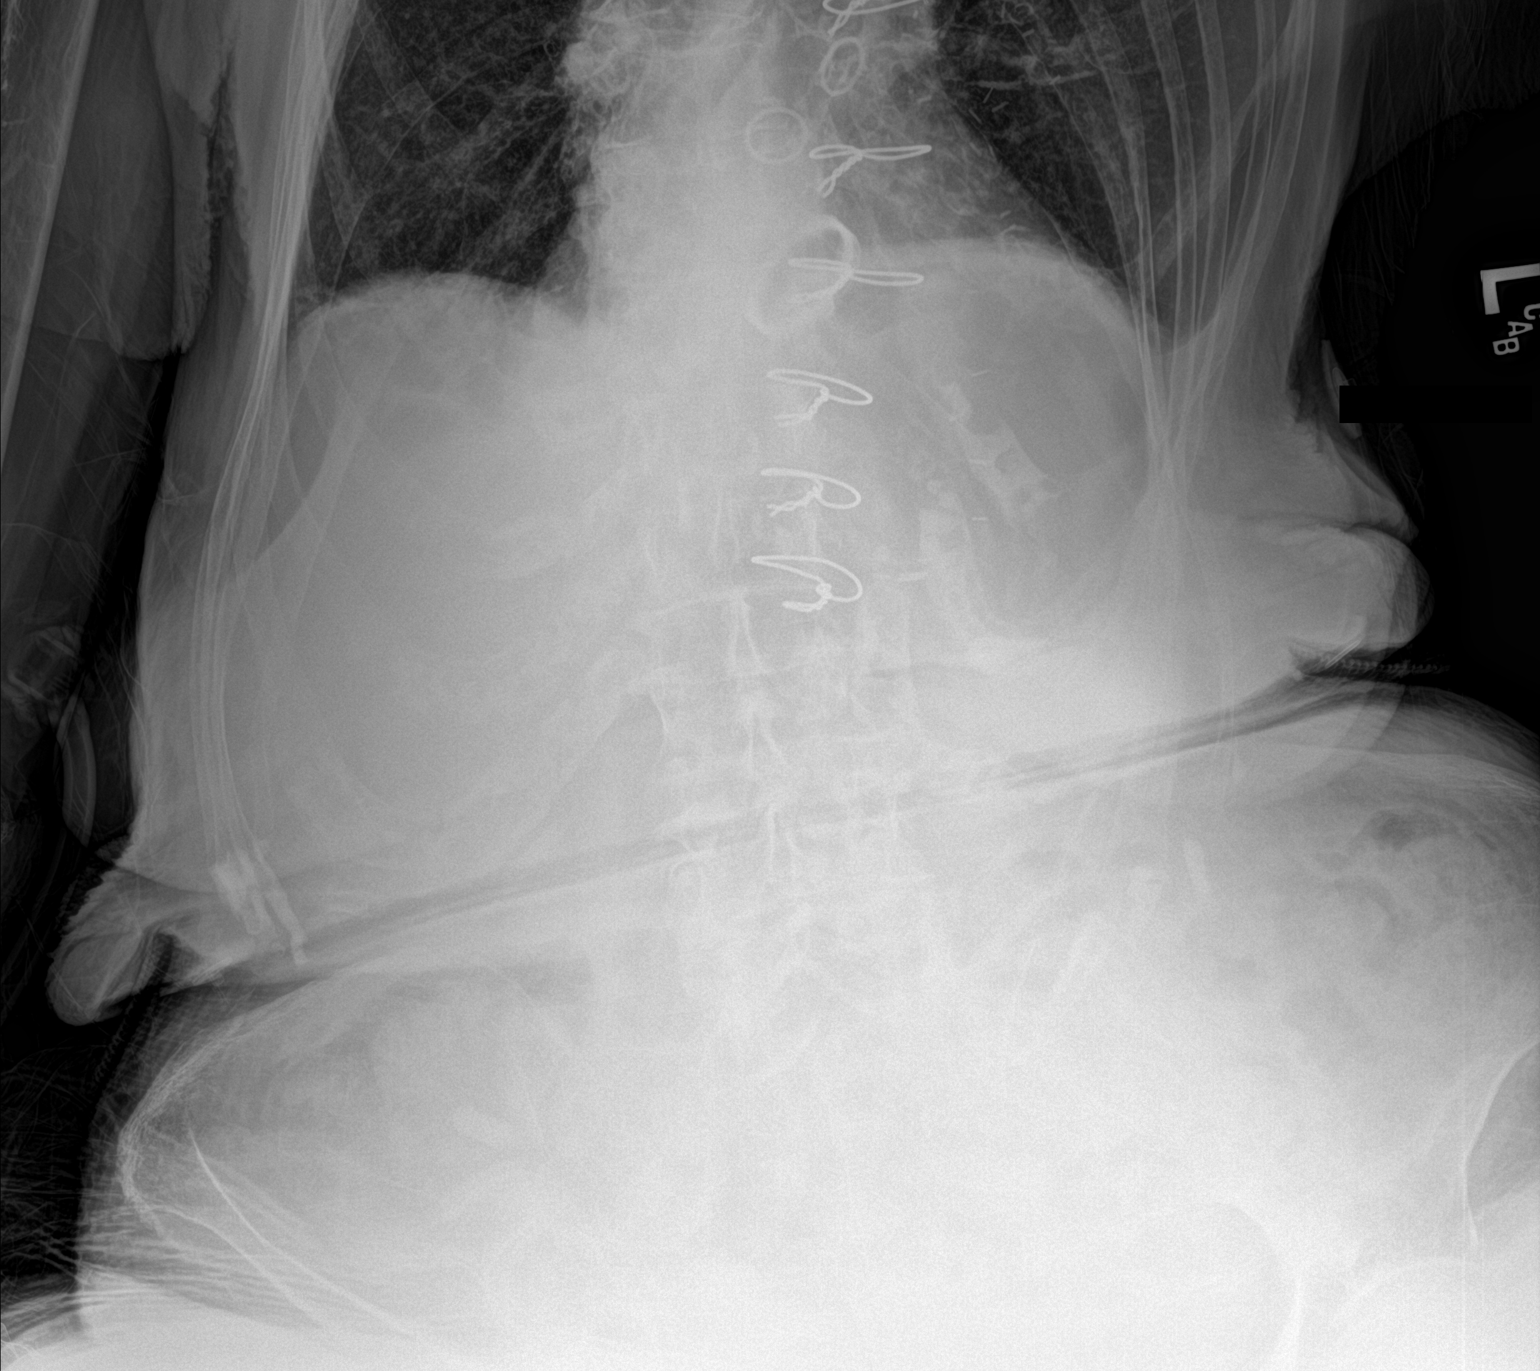
[im 2/2]
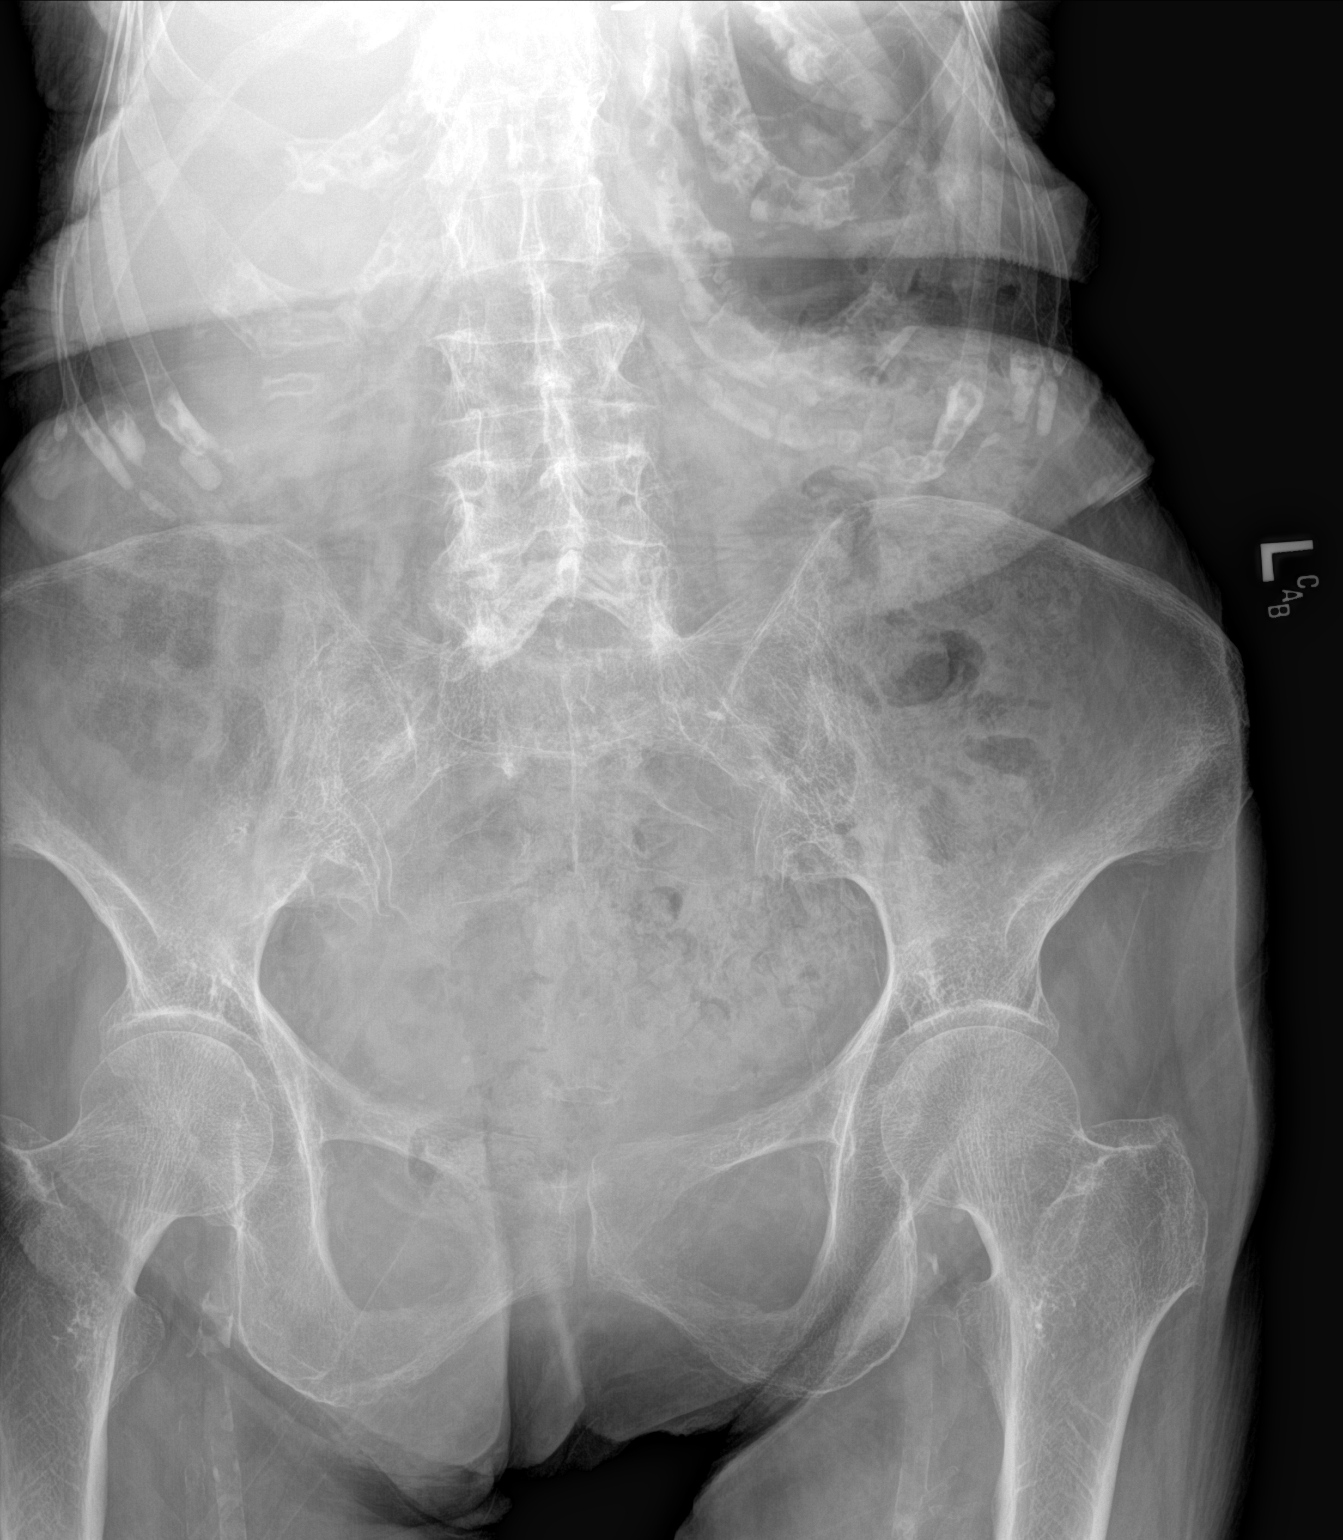

[2 of 2 positions shown; findings below may reference images not displayed]

FINDINGS: The bowel gas pattern is normal. There is no evidence of free air.
No radio-opaque calculi or other significant radiographic
abnormality is seen.
IMPRESSION: No evidence of bowel obstruction or ileus.

## 2017-10-15 ENCOUNTER — Other Ambulatory Visit: Payer: Self-pay

## 2017-10-15 MED ORDER — WARFARIN SODIUM 6 MG PO TABS: ORAL_TABLET | ORAL | 3 refills | Status: AC

## 2017-10-22 ENCOUNTER — Ambulatory Visit (INDEPENDENT_AMBULATORY_CARE_PROVIDER_SITE_OTHER): Payer: Medicare Other

## 2017-10-22 DIAGNOSIS — Z5181 Encounter for therapeutic drug level monitoring: Secondary | ICD-10-CM | POA: Diagnosis not present

## 2017-10-22 DIAGNOSIS — I059 Rheumatic mitral valve disease, unspecified: Secondary | ICD-10-CM | POA: Diagnosis not present

## 2017-10-22 DIAGNOSIS — Z9889 Other specified postprocedural states: Secondary | ICD-10-CM | POA: Diagnosis not present

## 2017-10-22 LAB — POCT INR: INR: 4.4

## 2017-10-22 NOTE — Patient Instructions (Signed)
Skip coumadin tonight and tomorrow, then resume dosage of 1 tablet every day, except 1/2 tablet on Tuesdays, Thursdays & Saturdays.    Recheck in 2 weeks.

## 2017-11-05 ENCOUNTER — Ambulatory Visit (INDEPENDENT_AMBULATORY_CARE_PROVIDER_SITE_OTHER): Payer: Medicare Other

## 2017-11-05 DIAGNOSIS — Z5181 Encounter for therapeutic drug level monitoring: Secondary | ICD-10-CM | POA: Diagnosis not present

## 2017-11-05 DIAGNOSIS — I059 Rheumatic mitral valve disease, unspecified: Secondary | ICD-10-CM | POA: Diagnosis not present

## 2017-11-05 DIAGNOSIS — Z9889 Other specified postprocedural states: Secondary | ICD-10-CM

## 2017-11-05 LAB — POCT INR: INR: 3.3

## 2017-11-05 NOTE — Patient Instructions (Signed)
Skip coumadin tonight, then resume dosage of 1 tablet every day, except 1/2 tablet on Tuesdays, Thursdays & Saturdays.    PLEASE BE CONSISTENT WITH YOUR GREEN INTAKE. Recheck in 2 weeks.

## 2017-11-24 ENCOUNTER — Ambulatory Visit (INDEPENDENT_AMBULATORY_CARE_PROVIDER_SITE_OTHER): Payer: Medicare Other

## 2017-11-24 DIAGNOSIS — Z9889 Other specified postprocedural states: Secondary | ICD-10-CM | POA: Diagnosis not present

## 2017-11-24 DIAGNOSIS — Z5181 Encounter for therapeutic drug level monitoring: Secondary | ICD-10-CM | POA: Diagnosis not present

## 2017-11-24 DIAGNOSIS — I059 Rheumatic mitral valve disease, unspecified: Secondary | ICD-10-CM

## 2017-11-24 LAB — POCT INR: INR: 3.8

## 2017-11-24 NOTE — Patient Instructions (Signed)
Please start new dosage of 1/2 tablet every day, except 1 tablet on Tuesdays, Thursdays & Saturdays.    PLEASE BE CONSISTENT WITH YOUR GREEN INTAKE. Recheck in 2 weeks.

## 2017-12-08 ENCOUNTER — Observation Stay
Admission: EM | Admit: 2017-12-08 | Discharge: 2017-12-10 | Disposition: A | Discharge status: Hospice | Payer: Medicare Other | Attending: Internal Medicine | Admitting: Internal Medicine

## 2017-12-08 ENCOUNTER — Emergency Department: Payer: Medicare Other

## 2017-12-08 ENCOUNTER — Other Ambulatory Visit: Payer: Self-pay

## 2017-12-08 DIAGNOSIS — Z7901 Long term (current) use of anticoagulants: Secondary | ICD-10-CM | POA: Insufficient documentation

## 2017-12-08 DIAGNOSIS — I252 Old myocardial infarction: Secondary | ICD-10-CM | POA: Diagnosis not present

## 2017-12-08 DIAGNOSIS — F17290 Nicotine dependence, other tobacco product, uncomplicated: Secondary | ICD-10-CM | POA: Insufficient documentation

## 2017-12-08 DIAGNOSIS — I110 Hypertensive heart disease with heart failure: Secondary | ICD-10-CM | POA: Diagnosis not present

## 2017-12-08 DIAGNOSIS — R778 Other specified abnormalities of plasma proteins: Secondary | ICD-10-CM

## 2017-12-08 DIAGNOSIS — I5022 Chronic systolic (congestive) heart failure: Secondary | ICD-10-CM | POA: Diagnosis not present

## 2017-12-08 DIAGNOSIS — R262 Difficulty in walking, not elsewhere classified: Secondary | ICD-10-CM | POA: Diagnosis not present

## 2017-12-08 DIAGNOSIS — I248 Other forms of acute ischemic heart disease: Secondary | ICD-10-CM | POA: Diagnosis not present

## 2017-12-08 DIAGNOSIS — Z79899 Other long term (current) drug therapy: Secondary | ICD-10-CM | POA: Diagnosis not present

## 2017-12-08 DIAGNOSIS — I209 Angina pectoris, unspecified: Secondary | ICD-10-CM | POA: Diagnosis present

## 2017-12-08 DIAGNOSIS — Z955 Presence of coronary angioplasty implant and graft: Secondary | ICD-10-CM | POA: Diagnosis not present

## 2017-12-08 DIAGNOSIS — R7989 Other specified abnormal findings of blood chemistry: Secondary | ICD-10-CM

## 2017-12-08 DIAGNOSIS — R079 Chest pain, unspecified: Secondary | ICD-10-CM

## 2017-12-08 DIAGNOSIS — E119 Type 2 diabetes mellitus without complications: Secondary | ICD-10-CM | POA: Diagnosis not present

## 2017-12-08 DIAGNOSIS — Z7982 Long term (current) use of aspirin: Secondary | ICD-10-CM | POA: Insufficient documentation

## 2017-12-08 DIAGNOSIS — R531 Weakness: Secondary | ICD-10-CM | POA: Insufficient documentation

## 2017-12-08 DIAGNOSIS — Z885 Allergy status to narcotic agent status: Secondary | ICD-10-CM | POA: Diagnosis not present

## 2017-12-08 DIAGNOSIS — I739 Peripheral vascular disease, unspecified: Secondary | ICD-10-CM | POA: Insufficient documentation

## 2017-12-08 DIAGNOSIS — Z952 Presence of prosthetic heart valve: Secondary | ICD-10-CM | POA: Diagnosis not present

## 2017-12-08 DIAGNOSIS — Z794 Long term (current) use of insulin: Secondary | ICD-10-CM | POA: Insufficient documentation

## 2017-12-08 DIAGNOSIS — Z880 Allergy status to penicillin: Secondary | ICD-10-CM | POA: Insufficient documentation

## 2017-12-08 DIAGNOSIS — E785 Hyperlipidemia, unspecified: Secondary | ICD-10-CM | POA: Diagnosis not present

## 2017-12-08 HISTORY — DX: Chronic systolic (congestive) heart failure: I50.22

## 2017-12-08 LAB — CBC
HCT: 32.2 % — ABNORMAL LOW (ref 35.0–47.0)
HEMOGLOBIN: 10.1 g/dL — AB (ref 12.0–16.0)
MCH: 24.7 pg — ABNORMAL LOW (ref 26.0–34.0)
MCHC: 31.2 g/dL — ABNORMAL LOW (ref 32.0–36.0)
MCV: 79.2 fL — AB (ref 80.0–100.0)
Platelets: 269 10*3/uL (ref 150–440)
RBC: 4.07 MIL/uL (ref 3.80–5.20)
RDW: 16.6 % — ABNORMAL HIGH (ref 11.5–14.5)
WBC: 5.1 10*3/uL (ref 3.6–11.0)

## 2017-12-08 LAB — PROTIME-INR
INR: 3.29
Prothrombin Time: 33.2 seconds — ABNORMAL HIGH (ref 11.4–15.2)

## 2017-12-08 LAB — BASIC METABOLIC PANEL
Anion gap: 8 (ref 5–15)
BUN: 42 mg/dL — ABNORMAL HIGH (ref 6–20)
CHLORIDE: 101 mmol/L (ref 101–111)
CO2: 28 mmol/L (ref 22–32)
Calcium: 9.6 mg/dL (ref 8.9–10.3)
Creatinine, Ser: 0.98 mg/dL (ref 0.44–1.00)
GFR calc non Af Amer: 49 mL/min — ABNORMAL LOW (ref >60)
GFR, EST AFRICAN AMERICAN: 57 mL/min — AB (ref >60)
Glucose, Bld: 157 mg/dL — ABNORMAL HIGH (ref 65–99)
Potassium: 4.2 mmol/L (ref 3.5–5.1)
SODIUM: 137 mmol/L (ref 135–145)

## 2017-12-08 LAB — TROPONIN I
Troponin I: 0.07 ng/mL (ref <0.03)
Troponin I: 0.07 ng/mL (ref <0.03)

## 2017-12-08 LAB — GLUCOSE, CAPILLARY: GLUCOSE-CAPILLARY: 114 mg/dL — AB (ref 65–99)

## 2017-12-08 MED ORDER — WARFARIN SODIUM 6 MG PO TABS: 6.0000 mg | ORAL_TABLET | ORAL | Status: DC

## 2017-12-08 MED ORDER — WARFARIN - PHYSICIAN DOSING INPATIENT
Freq: Every day | Status: DC
  Administered 2017-12-09: 18:00:00

## 2017-12-08 MED ORDER — INSULIN GLARGINE 100 UNIT/ML ~~LOC~~ SOLN
25.0000 [IU] | Freq: Every day | SUBCUTANEOUS | Status: DC
  Administered 2017-12-10: 25 [IU] via SUBCUTANEOUS
  Filled 2017-12-08 (×3): qty 0.25

## 2017-12-08 MED ORDER — WARFARIN SODIUM 3 MG PO TABS: 3.0000 mg | ORAL_TABLET | ORAL | Status: DC

## 2017-12-08 MED ORDER — PREMIER PROTEIN SHAKE
2.0000 [oz_av] | Freq: Two times a day (BID) | ORAL | Status: DC
  Administered 2017-12-09: 11 [oz_av] via ORAL
  Administered 2017-12-10: 2 [oz_av] via ORAL

## 2017-12-08 MED ORDER — EZETIMIBE 10 MG PO TABS
10.0000 mg | ORAL_TABLET | Freq: Every day | ORAL | Status: DC
  Administered 2017-12-08 – 2017-12-10 (×3): 10 mg via ORAL
  Filled 2017-12-08 (×2): qty 1

## 2017-12-08 MED ORDER — MORPHINE SULFATE (PF) 2 MG/ML IV SOLN: 2.0000 mg | INTRAVENOUS | Status: DC | PRN

## 2017-12-08 MED ORDER — ISOSORBIDE MONONITRATE ER 30 MG PO TB24
15.0000 mg | ORAL_TABLET | Freq: Every day | ORAL | Status: DC
  Administered 2017-12-09 – 2017-12-10 (×2): 15 mg via ORAL
  Filled 2017-12-08 (×2): qty 1

## 2017-12-08 MED ORDER — INSULIN ASPART 100 UNIT/ML ~~LOC~~ SOLN
4.0000 [IU] | Freq: Three times a day (TID) | SUBCUTANEOUS | Status: DC
  Administered 2017-12-09 – 2017-12-10 (×5): 4 [IU] via SUBCUTANEOUS
  Filled 2017-12-08 (×5): qty 1

## 2017-12-08 MED ORDER — LEVOTHYROXINE SODIUM 25 MCG PO TABS
25.0000 ug | ORAL_TABLET | Freq: Every day | ORAL | Status: DC
  Administered 2017-12-09 – 2017-12-10 (×2): 25 ug via ORAL
  Filled 2017-12-08 (×2): qty 1

## 2017-12-08 MED ORDER — FUROSEMIDE 20 MG PO TABS: 20.0000 mg | ORAL_TABLET | Freq: Every day | ORAL | Status: DC | PRN

## 2017-12-08 MED ORDER — ROSUVASTATIN CALCIUM 10 MG PO TABS
40.0000 mg | ORAL_TABLET | Freq: Every day | ORAL | Status: DC
  Administered 2017-12-08 – 2017-12-10 (×3): 40 mg via ORAL
  Filled 2017-12-08 (×2): qty 4

## 2017-12-08 MED ORDER — ACETAMINOPHEN 325 MG PO TABS: 650.0000 mg | ORAL_TABLET | ORAL | Status: DC | PRN

## 2017-12-08 MED ORDER — ONDANSETRON HCL 4 MG/2ML IJ SOLN: 4.0000 mg | Freq: Four times a day (QID) | INTRAMUSCULAR | Status: DC | PRN

## 2017-12-08 MED ORDER — POTASSIUM CHLORIDE CRYS ER 10 MEQ PO TBCR: 10.0000 meq | EXTENDED_RELEASE_TABLET | Freq: Every day | ORAL | Status: DC | PRN

## 2017-12-08 MED ORDER — GUAIFENESIN ER 600 MG PO TB12: 600.0000 mg | ORAL_TABLET | Freq: Two times a day (BID) | ORAL | Status: DC | PRN

## 2017-12-08 MED ORDER — ASPIRIN EC 81 MG PO TBEC
81.0000 mg | DELAYED_RELEASE_TABLET | Freq: Every day | ORAL | Status: DC
  Administered 2017-12-09 – 2017-12-10 (×2): 81 mg via ORAL
  Filled 2017-12-08 (×2): qty 1

## 2017-12-08 MED ORDER — LORATADINE 10 MG PO TABS
10.0000 mg | ORAL_TABLET | Freq: Every day | ORAL | Status: DC
  Administered 2017-12-09 – 2017-12-10 (×2): 10 mg via ORAL
  Filled 2017-12-08 (×2): qty 1

## 2017-12-08 MED ORDER — BENAZEPRIL HCL 20 MG PO TABS
20.0000 mg | ORAL_TABLET | Freq: Every day | ORAL | Status: DC
  Administered 2017-12-09 – 2017-12-10 (×2): 20 mg via ORAL
  Filled 2017-12-08 (×2): qty 1

## 2017-12-08 MED ORDER — WARFARIN SODIUM 3 MG PO TABS
3.0000 mg | ORAL_TABLET | ORAL | Status: DC
  Filled 2017-12-08: qty 1

## 2017-12-08 MED ORDER — LIRAGLUTIDE 18 MG/3ML ~~LOC~~ SOPN
1.8000 mg | PEN_INJECTOR | Freq: Every day | SUBCUTANEOUS | Status: DC
  Administered 2017-12-09: 1.8 mg via SUBCUTANEOUS
  Filled 2017-12-08: qty 3

## 2017-12-08 MED ORDER — PANTOPRAZOLE SODIUM 40 MG PO TBEC
40.0000 mg | DELAYED_RELEASE_TABLET | Freq: Two times a day (BID) | ORAL | Status: DC
  Administered 2017-12-09 – 2017-12-10 (×3): 40 mg via ORAL
  Filled 2017-12-08 (×3): qty 1

## 2017-12-08 MED ORDER — WARFARIN SODIUM 3 MG PO TABS: 3.0000 mg | ORAL_TABLET | Freq: Every day | ORAL | Status: DC | PRN

## 2017-12-08 MED ORDER — ROSUVASTATIN CALCIUM 10 MG PO TABS
40.0000 mg | ORAL_TABLET | Freq: Every day | ORAL | Status: DC
  Filled 2017-12-08: qty 4

## 2017-12-08 MED ORDER — WARFARIN SODIUM 6 MG PO TABS
6.0000 mg | ORAL_TABLET | ORAL | Status: DC
  Administered 2017-12-09: 6 mg via ORAL
  Filled 2017-12-08: qty 1

## 2017-12-08 MED ORDER — METOPROLOL SUCCINATE ER 50 MG PO TB24
75.0000 mg | ORAL_TABLET | Freq: Every day | ORAL | Status: DC
  Administered 2017-12-09 – 2017-12-10 (×2): 75 mg via ORAL
  Filled 2017-12-08 (×2): qty 1

## 2017-12-08 MED ORDER — POLYETHYLENE GLYCOL 3350 17 G PO PACK
17.0000 g | PACK | Freq: Every day | ORAL | Status: DC
  Administered 2017-12-10: 17 g via ORAL
  Filled 2017-12-08: qty 1

## 2017-12-08 MED ORDER — VITAMIN D 1000 UNITS PO TABS
5000.0000 [IU] | ORAL_TABLET | Freq: Every day | ORAL | Status: DC
  Administered 2017-12-09 – 2017-12-10 (×2): 5000 [IU] via ORAL
  Filled 2017-12-08 (×2): qty 5

## 2017-12-08 MED ORDER — GI COCKTAIL ~~LOC~~
30.0000 mL | Freq: Four times a day (QID) | ORAL | Status: DC | PRN
  Filled 2017-12-08: qty 30

## 2017-12-08 MED ORDER — EZETIMIBE 10 MG PO TABS
10.0000 mg | ORAL_TABLET | Freq: Every day | ORAL | Status: DC
  Filled 2017-12-08: qty 1

## 2017-12-08 MED ORDER — KETOROLAC TROMETHAMINE 10 MG PO TABS
10.0000 mg | ORAL_TABLET | Freq: Four times a day (QID) | ORAL | Status: DC | PRN
  Administered 2017-12-10: 10 mg via ORAL
  Filled 2017-12-08 (×2): qty 1

## 2017-12-08 MED ORDER — ENSURE ENLIVE PO LIQD: 237.0000 mL | Freq: Two times a day (BID) | ORAL | Status: DC

## 2017-12-08 MED ORDER — NITROGLYCERIN 0.4 MG SL SUBL: 0.4000 mg | SUBLINGUAL_TABLET | SUBLINGUAL | Status: DC | PRN

## 2017-12-08 MED ORDER — SODIUM CHLORIDE 0.9 % IV SOLN
Freq: Once | INTRAVENOUS | Status: AC
  Administered 2017-12-08: 15:00:00 via INTRAVENOUS

## 2017-12-08 NOTE — ED Notes (Signed)
Pt placed on bedpan. RN and tech tried to help pt up to go to bathroom and pt legs would not hold her up.

## 2017-12-08 NOTE — Progress Notes (Signed)
Requested family to bring Victoza from home. Family expressed concerns about pt not having Victoza tonight. Explained that if pt needs coverage, MD will order Novolog to cover for tonight. Family agreed and stated that they will bring Victoza by tomorrow.

## 2017-12-08 NOTE — ED Notes (Signed)
Pt assisted to get on bedpan and void

## 2017-12-08 NOTE — ED Notes (Addendum)
When pt was placed on bedpan we attempted to walk pt to bathroom 1st and her legs completely gave out from underneath her and she was unable to bear her own weight - Dr Mayford KnifeWilliams notified

## 2017-12-08 NOTE — ED Notes (Signed)
Elevated troponin of 0.07 reported to Dr Mayford KnifeWilliams - no new orders given at this time

## 2017-12-08 NOTE — ED Notes (Signed)
Pt had episode at home at 10am with leg weakness and then at 1030am c/o chest pain that was relieved with 2 ntg sl - pt is A&O x3 at this time and denies any pain, denies N/V - denies dizziness - denies chest pain - c/o shortness of breath but O2 sat 95% on RA - pt had episode of chest pain in ambulance and they gave 324mg  of ASA and chest pain was relieved with ASA and O2 via n/c for the trip to ED

## 2017-12-08 NOTE — H&P (Signed)
[employer] - [place] [facility]   PATIENT NAME: Amanda [name]    MR#:  [id number]  DATE OF BIRTH:  1925-09-04  DATE OF ADMISSION:  12/08/2017  PRIMARY CARE PHYSICIAN: Tenna Delaine, FNP   REQUESTING/REFERRING PHYSICIAN:   CHIEF COMPLAINT:   Chief Complaint  Patient presents with  . Chest Pain  . Shortness of Breath    HISTORY OF PRESENT ILLNESS: Amanda [name]  is a 84 y.o. female with a known history per below presented from home with chest pain located in her mid chest lasting 15-30 minutes, associated with shortness of breath, better with second dose of nitroglycerin, brought via EMS, chest pain in route to the hospital, alleviated again with nitroglycerin and aspirin, in the emergency room troponin 0.07, EKG with right bundle branch block/ST segment depression laterally, chest x-ray negative, patient evaluated in the emergency room with power of attorney at the bedside, patient is now being admitted for acute angina/possible non-STEMI.  PAST MEDICAL HISTORY:   Past Medical History:  Diagnosis Date  . CHF (congestive heart failure) (HCC)   . Coronary artery disease   . Diabetes mellitus without complication (HCC)   . Hyperlipidemia   . Hypertension   . Mitral valve disease   . NSTEMI (non-ST elevated myocardial infarction) (HCC) 01/15/2014    PAST SURGICAL HISTORY:  Past Surgical History:  Procedure Laterality Date  . CARDIAC CATHETERIZATION    . CORONARY ARTERY BYPASS GRAFT    . LEFT HEART CATHETERIZATION WITH CORONARY ANGIOGRAM N/A 01/17/2014   Procedure: LEFT HEART CATHETERIZATION WITH CORONARY ANGIOGRAM;  Surgeon: Lennette Bihari, MD;  Location: [facility] CATH LAB;  Service: Cardiovascular;  Laterality: N/A;  . MITRAL VALVE REPLACEMENT      SOCIAL HISTORY:  Social History   Tobacco Use  . Smoking status: Never Smoker  . Smokeless tobacco: Current User    Types: Snuff  Substance Use Topics  . Alcohol use: No    FAMILY HISTORY:  Family  History  Problem Relation Age of Onset  . Heart disease Mother   . Coronary artery disease Other   . Diabetes Other   . Stroke Other     DRUG ALLERGIES:  Allergies  Allergen Reactions  . Codeine     REACTION: makes her "crazy"  . Penicillins Hives    Has patient had a PCN reaction causing immediate rash, facial/tongue/throat swelling, SOB or lightheadedness with hypotension: Yes Has patient had a PCN reaction causing severe rash involving mucus membranes or skin necrosis: Unknown Has patient had a PCN reaction that required hospitalization: No Has patient had a PCN reaction occurring within the last 10 years: No If all of the above answers are "NO", then may proceed with Cephalosporin use.   . Tramadol     Hallucinations    REVIEW OF SYSTEMS:   CONSTITUTIONAL: No fever, +fatigue Georgeann Oppenheim.  EYES: No blurred or double vision.  EARS, NOSE, AND THROAT: No tinnitus or ear pain.  RESPIRATORY: No cough, shortness of breath, wheezing or hemoptysis.  CARDIOVASCULAR: + chest pain, no orthopnea, edema.  GASTROINTESTINAL: No nausea, vomiting, diarrhea or abdominal pain.  GENITOURINARY: No dysuria, hematuria.  ENDOCRINE: No polyuria, nocturia,  HEMATOLOGY: No anemia, easy bruising or bleeding SKIN: No rash or lesion. MUSCULOSKELETAL: No joint pain or arthritis.   NEUROLOGIC: No tingling, numbness, weakness.  PSYCHIATRY: No anxiety or depression.   MEDICATIONS AT HOME:  Prior to Admission medications   Medication Sig Start Date End Date Taking? Authorizing Provider  aspirin  81 MG EC tablet Take 81 mg by mouth daily.     Yes [provider]  benazepril (LOTENSIN) 20 MG tablet Take 20 mg by mouth daily.  02/21/15  Yes [provider]  Cholecalciferol (D-3-5) 5000 UNITS capsule Take 5,000 Units by mouth daily.   Yes [provider]  ezetimibe (ZETIA) 10 MG tablet TAKE 1 TABLET BY MOUTH DAILY 01/20/17  Yes Gollan, Tollie Pizza, MD  feeding supplement, ENSURE ENLIVE,  (ENSURE ENLIVE) LIQD Take 237 mLs by mouth 2 (two) times daily between meals. 10/10/16  Yes Hower, Cletis Athens, MD  furosemide (LASIX) 20 MG tablet TAKE 1 TABLET(20 MG) BY MOUTH DAILY AS NEEDED 05/05/17  Yes Gollan, Tollie Pizza, MD  guaiFENesin (MUCINEX) 600 MG 12 hr tablet Take 600 mg by mouth 2 (two) times daily as needed for cough or to loosen phlegm.   Yes [provider]  isosorbide mononitrate (IMDUR) 30 MG 24 hr tablet TAKE 1/2 TABLET BY MOUTH DAILY 09/15/17  Yes Gollan, Tollie Pizza, MD  LANTUS SOLOSTAR 100 UNIT/ML Solostar Pen Inject 25 Units into the skin daily.  11/07/17  Yes [provider]  levothyroxine (SYNTHROID, LEVOTHROID) 25 MCG tablet Take 25 mcg by mouth daily before breakfast.   Yes [provider]  loratadine (CLARITIN) 10 MG tablet Take 10 mg by mouth daily.   Yes [provider]  metoprolol succinate (TOPROL-XL) 50 MG 24 hr tablet TAKE 1 1/2 TABLETS BY MOUTH DAILY. TAKE WITH OR IMMEDIATELY FOLLOWING A MEAL 03/25/17  Yes Gollan, Tollie Pizza, MD  nitroGLYCERIN (NITROSTAT) 0.4 MG SL tablet Place 1 tablet (0.4 mg total) under the tongue every 5 (five) minutes as needed for chest pain. 05/01/16  Yes Gollan, Tollie Pizza, MD  omeprazole (PRILOSEC OTC) 20 MG tablet Take 20 mg by mouth 2 (two) times daily.   Yes [provider]  polyethylene glycol powder (GLYCOLAX/MIRALAX) powder Take 17 g by mouth daily.   Yes [provider]  potassium chloride (K-DUR) 10 MEQ tablet TAKE 1 TABLET(10 MEQ) BY MOUTH DAILY AS NEEDED 05/05/17  Yes Gollan, Tollie Pizza, MD  rosuvastatin (CRESTOR) 40 MG tablet TAKE 1 TABLET(40 MG) BY MOUTH AT BEDTIME 08/08/16  Yes Gollan, Tollie Pizza, MD  VICTOZA 18 MG/3ML SOPN Inject 1.8 mg into the skin at bedtime. 10/04/16  Yes [provider]  warfarin (COUMADIN) 6 MG tablet TAKE AS DIRECTED BY COUMADIN CLINIC Patient taking differently: As of 11/24/2017: 6MG  Tuesday, Thursday & Saturday. 3MG  Monday, Wednesday, Friday & Sunday  10/15/17  Yes Gollan, Tollie Pizza, MD  enoxaparin (LOVENOX) 60 MG/0.6ML injection Inject 0.6 mLs (60 mg total) into the skin daily. 10/15/16   Antonieta Iba, MD  insulin lispro (HUMALOG) 100 UNIT/ML injection Inject 4-10 Units into the skin 3 (three) times daily with meals. Patient is using the following sliding scale:  201-250= 4 units 251-300= 6 units 301-350= 8 units 351-400= 10 units    [provider]  ketorolac (TORADOL) 10 MG tablet Take 10 mg by mouth every 6 (six) hours as needed.    [provider]      PHYSICAL EXAMINATION:   VITAL SIGNS: Blood pressure (!) 150/63, pulse 80, temperature 98 F (36.7 C), temperature source Oral, resp. rate (!) 25, height 5' (1.524 m), weight 51.7 kg (114 lb), SpO2 97 %.  GENERAL:  84 y.o.-year-old patient lying in the bed with no acute distress.  Frail-appearing EYES: Pupils equal, round, reactive to light and accommodation. No scleral icterus. Extraocular muscles  intact.  HEENT: Head atraumatic, normocephalic. Oropharynx and nasopharynx clear.  NECK:  Supple, no jugular venous distention. No thyroid enlargement, no tenderness.  LUNGS: Normal breath sounds bilaterally, no wheezing, rales,rhonchi or crepitation. No use of accessory muscles of respiration.  CARDIOVASCULAR: S1, S2 normal. No murmurs, rubs, or gallops.  ABDOMEN: Soft, nontender, nondistended. Bowel sounds present. No organomegaly or mass.  EXTREMITIES: No pedal edema, cyanosis, or clubbing.  NEUROLOGIC: Cranial nerves II through XII are intact. Muscle strength 5/5 in all extremities. Sensation intact. Gait not checked.  PSYCHIATRIC: The patient is alert and oriented x 3.  SKIN: No obvious rash, lesion, or ulcer.   LABORATORY PANEL:   CBC Recent Labs  Lab 12/08/17 1316  WBC 5.1  HGB 10.1*  HCT 32.2*  PLT 269  MCV 79.2*  MCH 24.7*  MCHC 31.2*  RDW 16.6*    ------------------------------------------------------------------------------------------------------------------  Chemistries  Recent Labs  Lab 12/08/17 1316  NA 137  K 4.2  CL 101  CO2 28  GLUCOSE 157*  BUN 42*  CREATININE 0.98  CALCIUM 9.6   ------------------------------------------------------------------------------------------------------------------ estimated creatinine clearance is 27.4 mL/min (by C-G formula based on SCr of 0.98 mg/dL). ------------------------------------------------------------------------------------------------------------------ No results for input(s): TSH, T4TOTAL, T3FREE, THYROIDAB in the last 72 hours.  Invalid input(s): FREET3   Coagulation profile Recent Labs  Lab 12/08/17 1315  INR 3.29   ------------------------------------------------------------------------------------------------------------------- No results for input(s): DDIMER in the last 72 hours. -------------------------------------------------------------------------------------------------------------------  Cardiac Enzymes Recent Labs  Lab 12/08/17 1316  TROPONINI 0.07*   ------------------------------------------------------------------------------------------------------------------ Invalid input(s): POCBNP  ---------------------------------------------------------------------------------------------------------------  Urinalysis    Component Value Date/Time   COLORURINE YELLOW (A) 10/08/2016 1151   APPEARANCEUR CLEAR (A) 10/08/2016 1151   LABSPEC 1.013 10/08/2016 1151   PHURINE 7.0 10/08/2016 1151   GLUCOSEU 50 (A) 10/08/2016 1151   HGBUR NEGATIVE 10/08/2016 1151   BILIRUBINUR NEGATIVE 10/08/2016 1151   KETONESUR NEGATIVE 10/08/2016 1151   PROTEINUR NEGATIVE 10/08/2016 1151   UROBILINOGEN 0.2 01/16/2014 0636   NITRITE NEGATIVE 10/08/2016 1151   LEUKOCYTESUR NEGATIVE 10/08/2016 1151     RADIOLOGY: Dg Chest 2 View  Result Date:  12/08/2017 CLINICAL DATA:  Chest pain, confusion EXAM: CHEST  2 VIEW COMPARISON:  CT chest dated 10/08/2016 FINDINGS: Lungs are essentially clear. No frank interstitial edema. No pleural effusion or pneumothorax. Cardiomegaly.  Postsurgical changes related to prior CABG. Degenerative changes of the visualized thoracolumbar spine. Mild to moderate compression fracture deformities of two midthoracic vertebral bodies, chronic. Median sternotomy. IMPRESSION: No evidence of acute cardiopulmonary disease. Electronically Signed   By: Charline Bills M.D.   On: 12/08/2017 14:38    EKG: Orders placed or performed during the hospital encounter of 12/08/17  . ED EKG within 10 minutes  . ED EKG within 10 minutes  . EKG 12-Lead  . EKG 12-Lead    IMPRESSION AND PLAN: 1 acute angina/possible non-STEMI Continue aspirin, on Coumadin-INR 3.2, imdur, Toprol-XL, statin therapy-check lipids in the morning, check echocardiogram, cardiology to see, I suspect medical management would be best given frailty and advanced age  87 chronic systolic congestive heart failure without exacerbation most recent echocardiogram noted for ejection fraction 40-45% Stable on current regiment which will be continued  3 chronic diabetes mellitus type 2, controlled Continue home regiment, ADA/cardiac diet, with Accu-Cheks per routine  4 chronic benign essential hypertension Stable Continue home regiment  5 chronic hyperlipidemia, unspecified Continue statin therapy and check lipids in the morning  6 chronic artificial heart valve Stable Continue Coumadin, check PT/INR daily INR currently  3.2   All the records are reviewed and case discussed with ED provider. Management plans discussed with the patient, family and they are in agreement.  CODE STATUS:dnr Code Status History    Date Active Date Inactive Code Status Order ID Comments User Context   10/08/2016 17:26 10/10/2016 14:42 DNR [id number]  Enedina FinnerPatel, Sona, MD Inpatient    01/17/2014 10:34 01/21/2014 16:19 Full Code [id number]  Lennette BihariKelly, Thomas A, MD Inpatient   01/15/2014 16:27 01/17/2014 10:34 DNR [id number]  Leone BrandIngold, Laura R, NP Inpatient    Questions for Most Recent Historical Code Status (Order [id number])    Question Answer Comment   In the event of cardiac or respiratory ARREST Do not call a "code blue"    In the event of cardiac or respiratory ARREST Do not perform Intubation, CPR, defibrillation or ACLS    In the event of cardiac or respiratory ARREST Use medication by any route, position, wound care, and other measures to relive pain and suffering. May use oxygen, suction and manual treatment of airway obstruction as needed for comfort.        TOTAL TIME TAKING CARE OF THIS PATIENT: 45 minutes.    Evelena AsaMontell D Salary M.D on 12/08/2017   Between 7am to 6pm - Pager - [phone]  After 6pm go to [email or web address] - password [email or web address]  [facility]  Office  [phone]  CC: Primary care physician; Tenna DelaineJulich, Cynthia C, FNP   Note: This dictation was prepared with Dragon dictation along with smaller phrase technology. Any transcriptional errors that result from this process are unintentional.

## 2017-12-08 NOTE — ED Provider Notes (Signed)
[facility] Emergency Department Provider Note       Time seen: ----------------------------------------- 1:40 PM on 12/08/2017 -----------------------------------------   I have reviewed the triage vital signs and the nursing notes.  HISTORY   Chief Complaint Chest Pain and Shortness of Breath    HPI Amanda [name] is a 84 y.o. female with a history of CHF, coronary artery disease, diabetes, hyperlipidemia, hypertension who presents to the ED for altered mental status and difficulty walking.  Patient also had chest pain today and was given nitroglycerin with improvement in her chest pain.  Patient denies complaints now although she did have recurrence of her symptoms earlier.  She also had an aspirin.  Past Medical History:  Diagnosis Date  . CHF (congestive heart failure) (HCC)   . Coronary artery disease   . Diabetes mellitus without complication (HCC)   . Hyperlipidemia   . Hypertension   . Mitral valve disease   . NSTEMI (non-ST elevated myocardial infarction) (HCC) 01/15/2014    Patient Active Problem List   Diagnosis Date Noted  . Encounter for anticoagulation discussion and counseling 12/30/2016  . Vomiting 10/08/2016  . SOB (shortness of breath) 04/04/2016  . Abdominal pain 08/14/2015  . Nausea 02/22/2015  . Mitral valve disorder 02/14/2014  . Encounter for therapeutic drug monitoring 02/14/2014  . Anemia 01/21/2014  . Protein-calorie malnutrition, severe (HCC) 01/19/2014  . NSTEMI (non-ST elevated myocardial infarction) (HCC) 01/15/2014  . Diabetes mellitus (HCC) 05/29/2012  . Hypertension 05/29/2012  . Hyperlipidemia 04/05/2010  . CAD, ARTERY BYPASS GRAFT, 2004, LIMA-LAD; VG-RCA; VG-LCX 04/05/2010  . MITRAL VALVE REPLACEMENT, HX OF, 2004, 27 mmmechanical valve 12/27/2009    Past Surgical History:  Procedure Laterality Date  . CARDIAC CATHETERIZATION    . CORONARY ARTERY BYPASS GRAFT    . LEFT HEART CATHETERIZATION WITH CORONARY  ANGIOGRAM N/A 01/17/2014   Procedure: LEFT HEART CATHETERIZATION WITH CORONARY ANGIOGRAM;  Surgeon: Lennette Bihari, MD;  Location: [facility] CATH LAB;  Service: Cardiovascular;  Laterality: N/A;  . MITRAL VALVE REPLACEMENT      Allergies Codeine; Penicillins; and Tramadol  Social History Social History   Tobacco Use  . Smoking status: Never Smoker  . Smokeless tobacco: Current User    Types: Snuff  Substance Use Topics  . Alcohol use: No  . Drug use: No    Review of Systems Constitutional: Negative for fever. Cardiovascular: Positive for chest pain Respiratory: Negative for shortness of breath. Gastrointestinal: Negative for abdominal pain, vomiting and diarrhea. Musculoskeletal: Negative for back pain. Skin: Negative for rash. Neurological: Negative for headaches, focal weakness or numbness.  All systems negative/normal/unremarkable except as stated in the HPI  ____________________________________________   PHYSICAL EXAM:  VITAL SIGNS: ED Triage Vitals [12/08/17 1314]  Enc Vitals Group     BP (!) 147/68     Pulse Rate 85     Resp 15     Temp 98 F (36.7 C)     Temp Source Oral     SpO2 96 %     Weight 114 lb (51.7 kg)     Height 5' (1.524 m)     Head Circumference      Peak Flow      Pain Score 0     Pain Loc      Pain Edu?      Excl. in GC?     Constitutional: Alert and oriented. Well appearing and in no distress. Eyes: Conjunctivae are normal. Normal extraocular movements. ENT   Head: Normocephalic  and atraumatic.   Nose: No congestion/rhinnorhea.   Mouth/Throat: Mucous membranes are moist.   Neck: No stridor. Cardiovascular: Normal rate, regular rhythm.  Click from artificial heart valve auscultated Respiratory: Normal respiratory effort without tachypnea nor retractions. Breath sounds are clear and equal bilaterally. No wheezes/rales/rhonchi. Gastrointestinal: Soft and nontender. Normal bowel sounds Musculoskeletal: Nontender with normal  range of motion in extremities. No lower extremity tenderness nor edema. Neurologic:  Normal speech and language. No gross focal neurologic deficits are appreciated.  Skin:  Skin is warm, dry and intact. No rash noted. Psychiatric: Mood and affect are normal. Speech and behavior are normal.  ____________________________________________  EKG: Interpreted by me.  Sinus rhythm rate of 85 bpm, prolonged PR interval, wide QRS, right bundle branch block  ____________________________________________  ED COURSE:  As part of my medical decision making, I reviewed the following data within the electronic MEDICAL RECORD NUMBER History obtained from family if available, nursing notes, old chart and ekg, as well as notes from prior ED visits. Patient presented for chest pain and possible altered mental status and weakness, we will assess with labs and imaging as indicated at this time.   Procedures ____________________________________________   LABS (pertinent positives/negatives)  Labs Reviewed  BASIC METABOLIC PANEL - Abnormal; Notable for the following components:      Result Value   Glucose, Bld 157 (*)    BUN 42 (*)    GFR calc non Af Amer 49 (*)    GFR calc Af Amer 57 (*)    All other components within normal limits  CBC - Abnormal; Notable for the following components:   Hemoglobin 10.1 (*)    HCT 32.2 (*)    MCV 79.2 (*)    MCH 24.7 (*)    MCHC 31.2 (*)    RDW 16.6 (*)    All other components within normal limits  TROPONIN I - Abnormal; Notable for the following components:   Troponin I 0.07 (*)    All other components within normal limits  PROTIME-INR - Abnormal; Notable for the following components:   Prothrombin Time 33.2 (*)    All other components within normal limits    RADIOLOGY Images were viewed by me  Chest x-ray Is unremarkable ____________________________________________  DIFFERENTIAL DIAGNOSIS   CVA, MI, unstable angina, PE, pneumothorax, dissection,  dehydration, occult infection  FINAL ASSESSMENT AND PLAN  Chest pain, weakness, elevated troponin   Plan: Patient had presented for chest pain that had resolved with aspirin and nitroglycerin prior to arrival. Patient's labs did reveal an elevated troponin of 0.07. Patient's imaging was negative.  Symptoms likely consistent with unstable angina.  I will discussed with the hospitalist for admission.   Ulice DashJohnathan E Taedyn Glasscock, MD   Note: This note was generated in part or whole with voice recognition software. Voice recognition is usually quite accurate but there are transcription errors that can and very often do occur. I apologize for any typographical errors that were not detected and corrected.     Emily FilbertWilliams, Syncere Kaminski E, MD 12/08/17 1430

## 2017-12-09 ENCOUNTER — Encounter: Payer: Self-pay | Admitting: Physician Assistant

## 2017-12-09 ENCOUNTER — Observation Stay: Payer: Medicare Other

## 2017-12-09 ENCOUNTER — Observation Stay (HOSPITAL_BASED_OUTPATIENT_CLINIC_OR_DEPARTMENT_OTHER)
Admit: 2017-12-09 | Discharge: 2017-12-09 | Disposition: A | Discharge status: Home / Self Care | Payer: Medicare Other | Attending: Physician Assistant | Admitting: Physician Assistant

## 2017-12-09 DIAGNOSIS — I351 Nonrheumatic aortic (valve) insufficiency: Secondary | ICD-10-CM | POA: Diagnosis not present

## 2017-12-09 DIAGNOSIS — R29898 Other symptoms and signs involving the musculoskeletal system: Secondary | ICD-10-CM | POA: Diagnosis not present

## 2017-12-09 DIAGNOSIS — I25119 Atherosclerotic heart disease of native coronary artery with unspecified angina pectoris: Secondary | ICD-10-CM | POA: Diagnosis not present

## 2017-12-09 DIAGNOSIS — Z952 Presence of prosthetic heart valve: Secondary | ICD-10-CM | POA: Diagnosis not present

## 2017-12-09 DIAGNOSIS — R748 Abnormal levels of other serum enzymes: Secondary | ICD-10-CM | POA: Diagnosis not present

## 2017-12-09 DIAGNOSIS — M48061 Spinal stenosis, lumbar region without neurogenic claudication: Secondary | ICD-10-CM | POA: Diagnosis not present

## 2017-12-09 DIAGNOSIS — R079 Chest pain, unspecified: Secondary | ICD-10-CM | POA: Diagnosis not present

## 2017-12-09 LAB — GLUCOSE, CAPILLARY
Glucose-Capillary: 82 mg/dL (ref 65–99)
Glucose-Capillary: 170 mg/dL — ABNORMAL HIGH (ref 65–99)
Glucose-Capillary: 135 mg/dL — ABNORMAL HIGH (ref 65–99)
Glucose-Capillary: 234 mg/dL — ABNORMAL HIGH (ref 65–99)

## 2017-12-09 LAB — ECHOCARDIOGRAM COMPLETE
HEIGHTINCHES: 60 in
Weight: 1824 oz

## 2017-12-09 LAB — TSH: TSH: 2.709 u[IU]/mL (ref 0.350–4.500)

## 2017-12-09 LAB — TROPONIN I: Troponin I: 0.07 ng/mL (ref <0.03)

## 2017-12-09 NOTE — Evaluation (Signed)
Physical Therapy Evaluation Patient Details Name: [place] MRN: [id number] DOB: 1925-04-09 Today's Date: 12/09/2017   History of Present Illness  Pt is a 84 y/o F who presented with chest pain and SOB, better with nitroglycerin.  EKG with R bundle branch block/ST segment depression laterally, chest x-ray negative.  Pt admitted for acute angina/possible NTEMI.  Pt's chest pain has improved.  She does report new onset (2/18) RLe weakness and inability to stand.  CT head with no intracranial pathology.  Pt had a CT of her lumbar spine on 2/19 with results: Moderate to marked multifactorial stenosis at the L4-5 level that could cause neural compressive symptoms.  Mild to moderate multifactorial stenosis at L3-4 that could also possibly be symptomatic. Old partial compression fractures at T11, T12, L1 and L4. Since the previous imaging study of 10/08/2016, there is a newly seen superior endplate fracture at L3.  Pt's PMH includes CHF, NSTEMI, CABG, mitral valve replacement.      Clinical Impression  Pt admitted with above diagnosis. Pt currently with functional limitations due to the deficits listed below (see PT Problem List). At baseline Ms. Alsip performs stand pivot transfers from her bed to her WC without assist and without AD.  She presents with new onset BLE weakness (R more weak than the L).  Sensation testing completed and the pt does report that the RLE "feels different" compared to the LLE.  Pt unable to expand on this description. Denies tingling or numbness.   Pt denies any nubmness or tingling, sudden changes in weight, saddle anesthesia, night sweats. Pt with negative Hoover's sign Bil. Pt was unable to achieve full upright standing even with max assist provided.  Attempted x2 with significant Bil knee buckling.  Given pt's current mobility status, recommending SNF at d/c.  Pt will benefit from skilled PT to increase their independence and safety with mobility to allow discharge to the venue  listed below.      Follow Up Recommendations SNF    Equipment Recommendations  None recommended by PT    Recommendations for Other Services       Precautions / Restrictions Precautions Precautions: Fall Restrictions Weight Bearing Restrictions: No      Mobility  Bed Mobility Overal bed mobility: Needs Assistance Bed Mobility: Supine to Sit;Sit to Supine     Supine to sit: Min guard;HOB elevated Sit to supine: Min guard   General bed mobility comments: Increased time and effort  Transfers Overall transfer level: Needs assistance Equipment used: None;Rolling walker (2 wheeled) Transfers: Sit to/from Stand Sit to Stand: Max assist         General transfer comment: On first attempt pt performed with RW and is able to push up 75% of the way but unable to achieve full upright as pt demonstrates significant Bil knee buckling.  Repeated with introduction of RW but with same result.  Pt able to reach up to RW with one UE but cannot progress past this.    Ambulation/Gait             General Gait Details: Not safe to attempt at this time.   Stairs            Wheelchair Mobility    Modified Rankin (Stroke Patients Only)       Balance Overall balance assessment: Needs assistance Sitting-balance support: No upper extremity supported;Feet supported Sitting balance-Leahy Scale: Good     Standing balance support: Bilateral upper extremity supported;During functional activity Standing balance-Leahy Scale: Zero Standing  balance comment: Pt unable to achieve full upright standing due to BLE weakness                             Pertinent Vitals/Pain Pain Assessment: Faces Faces Pain Scale: Hurts little more Pain Location: chronic low back pain Pain Descriptors / Indicators: Aching;Discomfort Pain Intervention(s): Limited activity within patient's tolerance;Monitored during session;Repositioned    Home Living Family/patient expects to be  discharged to:: Private residence Living Arrangements: Other (Comment)(lives with niece and her child) Available Help at Discharge: Family;Available PRN/intermittently(alone during the day while niece at work) Type of Home: House         Home Equipment: Wheelchair - [employer]manual;Walker - 2 wheels;Toilet riser;Bedside commode;Grab bars - tub/shower      Prior Function Level of Independence: Needs assistance   Gait / Transfers Assistance Needed: No falls in the past 6 months.  Pt is non-ambulatory.  She performs stand [facility] without assist and without AD.  She is then able to self propel the [facility] with [facility]Es and LEs.   ADL's / Homemaking Assistance Needed: Niece pushes pt in [facility] into shower and assists pt with showering.  Pt reports she is independent with dressing.  Niece does the cooking, driving, cleaning.          Hand Dominance   Dominant Hand: Right    Extremity/Trunk Assessment   Upper Extremity Assessment Upper Extremity Assessment: (BUE strength grossly 3/5)    Lower Extremity Assessment Lower Extremity Assessment: RLE deficits/detail;LLE deficits/detail RLE Deficits / Details: DF 3/5, Knee E 3/5, Knee F 4-/5, hip F 4-/5.  Pt able to feel light touch with dermatomal testing but reports the RLE "feels different" compared to the LLE.  Pt unable to expand on this description. Denies tingling or numbness.  LLE Deficits / Details: Hip F, knee E, knee F 4/5.  DF 3+/5.   LLE Sensation: WNL    Cervical / Trunk Assessment Cervical / Trunk Assessment: Kyphotic;Other exceptions Cervical / Trunk Exceptions: scoliosis  Communication   Communication: HOH  Cognition Arousal/Alertness: Awake/alert Behavior During Therapy: WFL for tasks assessed/performed Overall Cognitive Status: Within Functional Limits for tasks assessed                                        General Comments General comments (skin integrity, edema, etc.): Pt denies any nubmness or  tingling, sudden changes in weight, saddle anesthesia, night sweats. Pt with negative Hoover's sign Bil.     Exercises     Assessment/Plan    PT Assessment Patient needs continued PT services  PT Problem List Decreased strength;Decreased activity tolerance;Decreased balance;Decreased mobility;Decreased knowledge of use of DME;Decreased safety awareness       PT Treatment Interventions DME instruction;Gait training;Stair training;Functional mobility training;Therapeutic activities;Therapeutic exercise;Balance training;Neuromuscular re-education;Patient/family education;Wheelchair mobility training    PT Goals (Current goals can be found in the Care Plan section)  Acute Rehab PT Goals Patient Stated Goal: to return to PLOF PT Goal Formulation: With patient Time For Goal Achievement: 12/23/17 Potential to Achieve Goals: Good    Frequency Min 2X/week   Barriers to discharge Decreased caregiver support pt is alone during the day    Co-evaluation               AM-PAC PT "6 Clicks" Daily Activity  Outcome Measure Difficulty turning over  in bed (including adjusting bedclothes, sheets and blankets)?: None Difficulty moving from lying on back to sitting on the side of the bed? : A Little Difficulty sitting down on and standing up from a chair with arms (e.g., wheelchair, bedside commode, etc,.)?: Unable Help needed moving to and from a bed to chair (including a wheelchair)?: Total Help needed walking in hospital room?: Total Help needed climbing 3-5 steps with a railing? : Total 6 Click Score: 11    End of Session Equipment Utilized During Treatment: Gait belt Activity Tolerance: Patient limited by fatigue;Patient tolerated treatment well;Other (comment)(limited by BLE weakness) Patient left: in bed;with call bell/phone within reach;with bed alarm set Nurse Communication: Mobility status PT Visit Diagnosis: Muscle weakness (generalized) (M62.81);Unsteadiness on feet  (R26.81);Difficulty in walking, not elsewhere classified (R26.2);Pain Pain - Right/Left: (lower) Pain - part of body: (back)    Time: 1610-9604 PT Time Calculation (min) (ACUTE ONLY): 23 min   Charges:   PT Evaluation $PT Eval Moderate Complexity: 1 Mod PT Treatments $Therapeutic Activity: 8-22 mins   PT G Codes:        Encarnacion Chu PT, DPT 12/09/2017, 4:01 PM

## 2017-12-09 NOTE — Progress Notes (Signed)
*  PRELIMINARY RESULTS* Echocardiogram 2D Echocardiogram has been performed.  Amanda [name], Amanda [name] 12/09/2017, 2:28 PM

## 2017-12-09 NOTE — Consult Note (Signed)
Cardiology Consultation:   Patient ID: [place]; [id number]; Nov 30, 1924   Admit date: 12/08/2017 Date of Consult: 12/09/2017  Primary Care Provider: Tenna Delaine, FNP Primary Cardiologist: Mariah Milling   Patient Profile:   Amanda [name] is a 84 y.o. female with a hx of CAD s/p CABG in 2004, mechanical mitral valve replacement on Coumadin with goal INR 2.5-3.5, chronic systolic CHF secondary to ischemic cardiomyopathy, chronic SOB, HTN, HLD, microcytic anemia, PAD, esophageal stricture s/p prior dilatation, and mostly wheelchair-bound who is being seen today for the evaluation of elevated troponin at the request of Dr. Katheren Shams.  History of Present Illness:   Amanda [name] most recently underwent LHC in 2015 for chest pain that showed patent grafts (SVG-RCA and OMD, patent graft to the mid LAD). She had severe 3-vessel disease with medical management being advised. Most recent echo from 2017 showed EF 40-45%, apical hypokinesis and a well functioning mechanical mitral valve.   Patient woke up later than usual on 12/08/2017, around 10 AM and reports she "just felt off." This was followed by the development of chest pain, which she took 2 SL NTG from a friend with improvement in her pain. She continued to "feel off." She noted her right leg was weak, leading her to almost fall. Her right lower leg continues to "feel like ice." She has had chronic SOB for at least the past 12 months that is unchanged. Because she "just wasn't feeling right" she called EMS and was brought to [facility]. No recent illnesses or fevers.   Upon the patient's arrival to [facility] she was noted to have stable vitals. EKG showed NSR with first degree AV block and RBBB. CXR showed no active cardiopulmonary disease. Troponin 0.07 x 2. WBC 5.1, HGB 10.1, PLT 269, Na 137, K+ 4.2, BUN 42, SCr 0.98, glucose 157. INR 3.29. She was given IV fluid bolus and continued on home medications upon admission. Cardiology was asked to evaluate. She  currently denies any chest pain. She continues to note right lower extremity weakness with associated cold sensation.   Past Medical History:  Diagnosis Date  . Chronic systolic CHF (congestive heart failure) (HCC)   . Coronary artery disease   . Diabetes mellitus without complication (HCC)   . Hyperlipidemia   . Hypertension   . Mitral valve disease   . NSTEMI (non-ST elevated myocardial infarction) (HCC) 01/15/2014    Past Surgical History:  Procedure Laterality Date  . CARDIAC CATHETERIZATION    . CORONARY ARTERY BYPASS GRAFT    . LEFT HEART CATHETERIZATION WITH CORONARY ANGIOGRAM N/A 01/17/2014   Procedure: LEFT HEART CATHETERIZATION WITH CORONARY ANGIOGRAM;  Surgeon: Lennette Bihari, MD;  Location[facility] C[facility] LAB;  Service: Cardiovascular;  Laterality: N/A;  . MITRAL VALVE REPLACEMENT       Home Meds: Prior to Admission medications   Medication Sig Start Date End Date Taking? Authorizing Provider  aspirin 81 MG EC tablet Take 81 mg by mouth daily.     Yes [provider]  benazepril (LOTENSIN) 20 MG tablet Take 20 mg by mouth daily.  02/21/15  Yes [provider]  Cholecalciferol (D-3-5) 5000 UNITS capsule Take 5,000 Units by mouth daily.   Yes [provider]  ezetimibe (ZETIA) 10 MG tablet TAKE 1 TABLET BY MOUTH DAILY 01/20/17  Yes Gollan, Tollie Pizza, MD  feeding supplement, ENSURE ENLIVE, (ENSURE ENLIVE) LIQD Take 237 mLs by mouth 2 (two) times daily between meals. 10/10/16  Yes Hower, Cletis [place], MD  furosemide (LASIX) 20 MG tablet TAKE 1 TABLET(20 MG) BY MOUTH DAILY AS NEEDED 05/05/17  Yes Gollan, Tollie Pizza, MD  guaiFENesin (MUCINEX) 600 MG 12 hr tablet Take 600 mg by mouth 2 (two) times daily as needed for cough or to loosen phlegm.   Yes [provider]  isosorbide mononitrate (IMDUR) 30 MG 24 hr tablet TAKE 1/2 TABLET BY MOUTH DAILY 09/15/17  Yes Gollan, Tollie Pizza, MD  LANTUS SOLOSTAR 100 UNIT/ML Solostar Pen Inject 25 Units into the skin daily.   11/07/17  Yes [provider]  levothyroxine (SYNTHROID, LEVOTHROID) 25 MCG tablet Take 25 mcg by mouth daily before breakfast.   Yes [provider]  loratadine (CLARITIN) 10 MG tablet Take 10 mg by mouth daily.   Yes [provider]  metoprolol succinate (TOPROL-XL) 50 MG 24 hr tablet TAKE 1 1/2 TABLETS BY MOUTH DAILY. TAKE WITH OR IMMEDIATELY FOLLOWING A MEAL 03/25/17  Yes Gollan, Tollie Pizza, MD  nitroGLYCERIN (NITROSTAT) 0.4 MG SL tablet Place 1 tablet (0.4 mg total) under the tongue every 5 (five) minutes as needed for chest pain. 05/01/16  Yes Gollan, Tollie Pizza, MD  omeprazole (PRILOSEC OTC) 20 MG tablet Take 20 mg by mouth 2 (two) times daily.   Yes [provider]  polyethylene glycol powder (GLYCOLAX/MIRALAX) powder Take 17 g by mouth daily.   Yes [provider]  potassium chloride (K-DUR) 10 MEQ tablet TAKE 1 TABLET(10 MEQ) BY MOUTH DAILY AS NEEDED 05/05/17  Yes Gollan, Tollie Pizza, MD  rosuvastatin (CRESTOR) 40 MG tablet TAKE 1 TABLET(40 MG) BY MOUTH AT BEDTIME 08/08/16  Yes Gollan, Tollie Pizza, MD  VICTOZA 18 MG/3ML SOPN Inject 1.8 mg into the skin at bedtime. 10/04/16  Yes [provider]  warfarin (COUMADIN) 6 MG tablet TAKE AS DIRECTED BY COUMADIN CLINIC Patient taking differently: As of 11/24/2017: 6MG  Tuesday, Thursday & Saturday. 3MG  Monday, Wednesday, Friday & Sunday 10/15/17  Yes Gollan, Tollie Pizza, MD  enoxaparin (LOVENOX) 60 MG/0.6ML injection Inject 0.6 mLs (60 mg total) into the skin daily. 10/15/16   Antonieta Iba, MD  insulin lispro (HUMALOG) 100 UNIT/ML injection Inject 4-10 Units into the skin 3 (three) times daily with meals. Patient is using the following sliding scale:  201-250= 4 units 251-300= 6 units 301-350= 8 units 351-400= 10 units    [provider]  ketorolac (TORADOL) 10 MG tablet Take 10 mg by mouth every 6 (six) hours as needed.    [provider]    Inpatient Medications: Scheduled  Meds: . aspirin EC  81 mg Oral Daily  . benazepril  20 mg Oral Daily  . cholecalciferol  5,000 Units Oral Daily  . ezetimibe  10 mg Oral Daily  . insulin aspart  4 Units Subcutaneous TID WC  . insulin glargine  25 Units Subcutaneous Daily  . isosorbide mononitrate  15 mg Oral Daily  . levothyroxine  25 mcg Oral QAC breakfast  . liraglutide  1.8 mg Subcutaneous QHS  . loratadine  10 mg Oral Daily  . metoprolol succinate  75 mg Oral Daily  . pantoprazole  40 mg Oral BID AC  . polyethylene glycol  17 g Oral Daily  . protein supplement shake  2 oz Oral BID BM  . rosuvastatin  40 mg Oral q1800  . [START ON 12/14/2017] warfarin  3 mg Oral Q Sun-1800   And  . warfarin  3 mg Oral Q Mon-1800   And  . warfarin  6 mg Oral  Q Tue-1800   And  . [START ON 12/10/2017] warfarin  3 mg Oral Q Wed-1800   And  . [START ON 12/11/2017] warfarin  6 mg Oral Q Thu-1800   And  . [START ON 12/12/2017] warfarin  3 mg Oral Q Fri-1800   And  . [START ON 12/13/2017] warfarin  6 mg Oral Q Sat-1800  . Warfarin - Physician Dosing Inpatient   Does not apply q1800   Continuous Infusions:  PRN Meds: acetaminophen, furosemide, gi cocktail, guaiFENesin, ketorolac, morphine injection, nitroGLYCERIN, ondansetron (ZOFRAN) IV, potassium chloride  Allergies:   Allergies  Allergen Reactions  . Codeine     REACTION: makes her "crazy"  . Penicillins Hives    Has patient had a PCN reaction causing immediate rash, facial/tongue/throat swelling, SOB or lightheadedness with hypotension: Yes Has patient had a PCN reaction causing severe rash involving mucus membranes or skin necrosis: Unknown Has patient had a PCN reaction that required hospitalization: No Has patient had a PCN reaction occurring within the last 10 years: No If all of the above answers are "NO", then may proceed with Cephalosporin use.   . Tramadol     Hallucinations    Social History:   Social History   Socioeconomic History  . Marital status:  Widowed    Spouse name: Not on file  . Number of children: Not on file  . Years of education: Not on file  . Highest education level: Not on file  Social Needs  . Financial resource strain: Not on file  . Food insecurity - worry: Not on file  . Food insecurity - inability: Not on file  . Transportation needs - medical: Not on file  . Transportation needs - non-medical: Not on file  Occupational History  . Not on file  Tobacco Use  . Smoking status: Never Smoker  . Smokeless tobacco: Current User    Types: Snuff  Substance and Sexual Activity  . Alcohol use: No  . Drug use: No  . Sexual activity: No  Other Topics Concern  . Not on file  Social History Narrative  . Not on file     Family History:   Family History  Problem Relation Age of Onset  . Heart disease Mother   . Coronary artery disease Other   . Diabetes Other   . Stroke Other     ROS:  Review of Systems  Constitutional: Positive for malaise/fatigue and weight loss. Negative for chills, diaphoresis and fever.  HENT: Negative for congestion.   Eyes: Negative for discharge and redness.  Respiratory: Positive for shortness of breath. Negative for cough, hemoptysis, sputum production and wheezing.   Cardiovascular: Positive for chest pain. Negative for palpitations, orthopnea, claudication, leg swelling and PND.  Gastrointestinal: Negative for abdominal pain, blood in stool, heartburn, melena, nausea and vomiting.  Genitourinary: Negative for hematuria.  Musculoskeletal: Negative for falls and myalgias.  Skin: Negative for rash.  Neurological: Positive for sensory change, focal weakness and weakness. Negative for dizziness, tingling, tremors, speech change and loss of consciousness.  Endo/Heme/Allergies: Does not bruise/bleed easily.  Psychiatric/Behavioral: Negative for substance abuse. The patient is not nervous/anxious.   All other systems reviewed and are negative.     Physical Exam/Data:   Vitals:    12/09/17 0429 12/09/17 0836 12/09/17 0838 12/09/17 0900  BP: (!) 114/40 (!) 121/54 (!) 121/54   Pulse: 92 90 89   Resp: 18   18  Temp: 98.7 F (37.1 C)   98.2 F (36.8  C)  TempSrc: Oral   Axillary  SpO2: 94%   96%  Weight:      Height:        Intake/Output Summary (Last 24 hours) at 12/09/2017 0954 Last data filed at 12/09/2017 0421 Gross per 24 hour  Intake 1000 ml  Output 300 ml  Net 700 ml   Filed Weights   12/08/17 1314  Weight: 114 lb (51.7 kg)   Body mass index is 22.26 kg/m.   Physical Exam: General: Elderly and frail appearing, in no acute distress. Head: Normocephalic, atraumatic, sclera non-icteric, no xanthomas, nares without discharge. Neck: Negative for carotid bruits. JVD not elevated. Lungs: Clear bilaterally to auscultation without wheezes, rales, or rhonchi. Breathing is unlabored. Heart: RRR with S1 S2. No murmurs, rubs, or gallops appreciated. Abdomen: Soft, non-tender, non-distended with normoactive bowel sounds. No hepatomegaly. No rebound/guarding. No obvious abdominal masses. Msk:  Strength and tone appear normal for age. Extremities: No clubbing or cyanosis. No edema. Distal pedal pulses are 2+ and equal bilaterally. 4/5 right lower extremity strength. 5/5 left lower extremity strength.  Neuro: Alert and oriented X 3. No facial asymmetry. No focal deficit. Moves all extremities spontaneously. Psych:  Responds to questions appropriately with a normal affect.   EKG:  The EKG was personally reviewed and demonstrates: NSR, 1st degree AV block, RBBB Telemetry:  Telemetry was personally reviewed and demonstrates: NSR with first degree AV block  Weights: Filed Weights   12/08/17 1314  Weight: 114 lb (51.7 kg)    Relevant CV Studies: TTE pending  TTE 03/2016: Study Conclusions  - Left ventricle: The cavity size was normal. Systolic function was   mildly to moderately reduced. The estimated ejection fraction was   in the range of 40% to 45%.  Hypokinesis of the apical myocardium.   The study is not technically sufficient to allow evaluation of LV   diastolic function. - Aortic valve: There was mild regurgitation. Peak gradient (S): 7   mm Hg. - Mitral valve: A mechanical prosthesis was present. Transvalvular   velocity was within the normal range. There was no evidence for   stenosis. Peak gradient (D): 8 mm Hg. - Left atrium: The atrium was normal in size. - Pulmonary arteries: Systolic pressure was within the normal   range.  Laboratory Data:  Chemistry Recent Labs  Lab 12/08/17 1316  NA 137  K 4.2  CL 101  CO2 28  GLUCOSE 157*  BUN 42*  CREATININE 0.98  CALCIUM 9.6  GFRNONAA 49*  GFRAA 57*  ANIONGAP 8    No results for input(s): PROT, ALBUMIN, AST, ALT, ALKPHOS, BILITOT in the last 168 hours. Hematology Recent Labs  Lab 12/08/17 1316  WBC 5.1  RBC 4.07  HGB 10.1*  HCT 32.2*  MCV 79.2*  MCH 24.7*  MCHC 31.2*  RDW 16.6*  PLT 269   Cardiac Enzymes Recent Labs  Lab 12/08/17 1316 12/08/17 1848 12/09/17 0029  TROPONINI 0.07* 0.07* 0.07*   No results for input(s): TROPIPOC in the last 168 hours.  BNPNo results for input(s): BNP, PROBNP in the last 168 hours.  DDimer No results for input(s): DDIMER in the last 168 hours.  Radiology/Studies:  Dg Chest 2 View  Result Date: 12/08/2017 IMPRESSION: No evidence of acute cardiopulmonary disease. Electronically Signed   By: Charline BillsSriyesh  Krishnan M.D.   On: 12/08/2017 14:38    Assessment and Plan:   1. Elevated troponin: -Minimally elevated and flat trending with a peak of 0.07 -Not consistent with a  NSTEMI -Chest pain free currently -Check echo -Will defer invasive ischemic evaluation at this time until her echo and neurological workup is completed -If echo demonstrates a stable EF, could consider Lexiscan Myoview prior to discharge to evaluate for high-risk ischemia -If echo demonstrates a worsening EF or WMA concerning for ischemia, she may require  LHC prior to discharge, though given she is fully anticoagulated, this would be delayed until her INR is acceptable and she would require a heparin bridge given her mechanical mitral valve -For now, will hold off on initiating heparin until her echo and neuro workup are complete   2. CAD of native coronary arteries and bypass graft with chest pain at moderate risk for cardiac etiology: -As above -Continue ASA, Toprol, Crestor  3. Right lower extremity weakness: -Concerning for possible neurological process -Recommend full neuro work up, defer to IM  4. Status post mechanical mitral valve: -Check echo as above -INR per pharmacy  -ASA  5. Chronic systolic CHF/ICM/SOB: -Stable for the past 12 months -She does not appear grossly volume overloaded -Suspect this is multifactorial including chronic CHF, advanced age with physical deconditioning and frail state as well as anemia -Check echo as above -Continue Toprol and benazepril  -Will hold off on adding spironolactone at this time given advanced age and frail state -Check TSH  6. Microcytic anemia: -HGB appears stable -Per IM   For questions or updates, please contact [facility] Please consult [email or web address] for contact info under Cardiology/STEMI.   Signed, Eula Listen, PA-C [facility] Pager: [phone] 12/09/2017, 9:54 AM

## 2017-12-09 NOTE — Consult Note (Signed)
Referring Physician: Sherryll BurgerShah    Chief Complaint: RLE  weakness  HPI: Amanda [name] is an 84 y.o. female s/p valve replacement on Coumadin who reports RLE weakness.  Patient reports that at baseline she is w/c bound but she is able to feed herself, do transfers and dress herself. On yesterday morning was unable to support her own weight on standing.  Has noticed RLE weakness and numbness since that time.  No complaints of bowel or bladder incontinence.    Date last known well: Date: 12/07/2017 Time last known well: Time: 21:30 tPA Given: No: On Coumadin, outside time window  Past Medical History:  Diagnosis Date  . Chronic systolic CHF (congestive heart failure) (HCC)   . Coronary artery disease   . Diabetes mellitus without complication (HCC)   . Hyperlipidemia   . Hypertension   . Mitral valve disease   . NSTEMI (non-ST elevated myocardial infarction) (HCC) 01/15/2014    Past Surgical History:  Procedure Laterality Date  . CARDIAC CATHETERIZATION    . CORONARY ARTERY BYPASS GRAFT    . LEFT HEART CATHETERIZATION WITH CORONARY ANGIOGRAM N/A 01/17/2014   Procedure: LEFT HEART CATHETERIZATION WITH CORONARY ANGIOGRAM;  Surgeon: Lennette Biharihomas A Kelly, MD;  [facility];  Service: Cardiovascular;  Laterality: N/A;  . MITRAL VALVE REPLACEMENT      Family History  Problem Relation Age of Onset  . Heart disease Mother   . Coronary artery disease Other   . Diabetes Other   . Stroke Other    Social History:  reports that  has never smoked. Her smokeless tobacco use includes snuff. She reports that she does not drink alcohol or use drugs.  Allergies:  Allergies  Allergen Reactions  . Codeine     REACTION: makes her "crazy"  . Penicillins Hives    Has patient had a PCN reaction causing immediate rash, facial/tongue/throat swelling, SOB or lightheadedness with hypotension: Yes Has patient had a PCN reaction causing severe rash involving mucus membranes or skin necrosis: Unknown Has  patient had a PCN reaction that required hospitalization: No Has patient had a PCN reaction occurring within the last 10 years: No If all of the above answers are "NO", then may proceed with Cephalosporin use.   . Tramadol     Hallucinations    Medications:  I have reviewed the patient's current medications. Prior to Admission:  Medications Prior to Admission  Medication Sig Dispense Refill Last Dose  . aspirin 81 MG EC tablet Take 81 mg by mouth daily.     12/08/2017 at Unknown time  . benazepril (LOTENSIN) 20 MG tablet Take 20 mg by mouth daily.   3 12/08/2017 at Unknown time  . Cholecalciferol (D-3-5) 5000 UNITS capsule Take 5,000 Units by mouth daily.   12/08/2017 at Unknown time  . ezetimibe (ZETIA) 10 MG tablet TAKE 1 TABLET BY MOUTH DAILY 30 tablet 3 12/07/2017 at Unknown time  . feeding supplement, ENSURE ENLIVE, (ENSURE ENLIVE) LIQD Take 237 mLs by mouth 2 (two) times daily between meals. 237 mL 12 12/08/2017 at Unknown time  . furosemide (LASIX) 20 MG tablet TAKE 1 TABLET(20 MG) BY MOUTH DAILY AS NEEDED 30 tablet 3 12/07/2017 at Unknown time  . guaiFENesin (MUCINEX) 600 MG 12 hr tablet Take 600 mg by mouth 2 (two) times daily as needed for cough or to loosen phlegm.   12/08/2017 at Unknown time  . isosorbide mononitrate (IMDUR) 30 MG 24 hr tablet TAKE 1/2 TABLET BY MOUTH DAILY 45 tablet 3  12/08/2017 at Unknown time  . LANTUS SOLOSTAR 100 UNIT/ML Solostar Pen Inject 25 Units into the skin daily.   4 12/08/2017 at Unknown time  . levothyroxine (SYNTHROID, LEVOTHROID) 25 MCG tablet Take 25 mcg by mouth daily before breakfast.   12/08/2017 at Unknown time  . loratadine (CLARITIN) 10 MG tablet Take 10 mg by mouth daily.   12/08/2017 at Unknown time  . metoprolol succinate (TOPROL-XL) 50 MG 24 hr tablet TAKE 1 1/2 TABLETS BY MOUTH DAILY. TAKE WITH OR IMMEDIATELY FOLLOWING A MEAL 135 tablet 3 12/08/2017 at Unknown time  . nitroGLYCERIN (NITROSTAT) 0.4 MG SL tablet Place 1 tablet (0.4 mg total) under  the tongue every 5 (five) minutes as needed for chest pain. 25 tablet 3 12/08/2017 at Unknown time  . omeprazole (PRILOSEC OTC) 20 MG tablet Take 20 mg by mouth 2 (two) times daily.   12/08/2017 at Unknown time  . polyethylene glycol powder (GLYCOLAX/MIRALAX) powder Take 17 g by mouth daily.   12/07/2017 at Unknown time  . potassium chloride (K-DUR) 10 MEQ tablet TAKE 1 TABLET(10 MEQ) BY MOUTH DAILY AS NEEDED 30 tablet 3 12/07/2017 at Unknown time  . rosuvastatin (CRESTOR) 40 MG tablet TAKE 1 TABLET(40 MG) BY MOUTH AT BEDTIME 90 tablet 3 12/07/2017 at Unknown time  . VICTOZA 18 MG/3ML SOPN Inject 1.8 mg into the skin at bedtime.  5 12/07/2017 at Unknown time  . warfarin (COUMADIN) 6 MG tablet TAKE AS DIRECTED BY COUMADIN CLINIC (Patient taking differently: As of 11/24/2017: 6MG  Tuesday, Thursday & Saturday. 3MG  Monday, Wednesday, Friday & Sunday) 30 tablet 3 12/08/2017 at Unknown time  . enoxaparin (LOVENOX) 60 MG/0.6ML injection Inject 0.6 mLs (60 mg total) into the skin daily. 6 Syringe 0 prn at prn  . insulin lispro (HUMALOG) 100 UNIT/ML injection Inject 4-10 Units into the skin 3 (three) times daily with meals. Patient is using the following sliding scale:  201-250= 4 units 251-300= 6 units 301-350= 8 units 351-400= 10 units   PRN at PRN  . ketorolac (TORADOL) 10 MG tablet Take 10 mg by mouth every 6 (six) hours as needed.   PRN at PRN   Scheduled: . aspirin EC  81 mg Oral Daily  . benazepril  20 mg Oral Daily  . cholecalciferol  5,000 Units Oral Daily  . ezetimibe  10 mg Oral Daily  . insulin aspart  4 Units Subcutaneous TID WC  . insulin glargine  25 Units Subcutaneous Daily  . isosorbide mononitrate  15 mg Oral Daily  . levothyroxine  25 mcg Oral QAC breakfast  . liraglutide  1.8 mg Subcutaneous QHS  . loratadine  10 mg Oral Daily  . metoprolol succinate  75 mg Oral Daily  . pantoprazole  40 mg Oral BID AC  . polyethylene glycol  17 g Oral Daily  . protein supplement shake  2 oz Oral BID  BM  . rosuvastatin  40 mg Oral q1800  . [START ON 12/14/2017] warfarin  3 mg Oral Q Sun-1800   And  . warfarin  3 mg Oral Q Mon-1800   And  . warfarin  6 mg Oral Q Tue-1800   And  . [START ON 12/10/2017] warfarin  3 mg Oral Q Wed-1800   And  . [START ON 12/11/2017] warfarin  6 mg Oral Q Thu-1800   And  . [START ON 12/12/2017] warfarin  3 mg Oral Q Fri-1800   And  . [START ON 12/13/2017] warfarin  6 mg Oral Q Sat-1800  .  Warfarin - Physician Dosing Inpatient   Does not apply q1800    ROS: History obtained from the patient  General ROS: negative for - chills, fatigue, fever, night sweats, weight gain or weight loss Psychological ROS: negative for - behavioral disorder, hallucinations, memory difficulties, mood swings or suicidal ideation Ophthalmic ROS: negative for - blurry vision, double vision, eye pain or loss of vision ENT ROS: negative for - epistaxis, nasal discharge, oral lesions, sore throat, tinnitus or vertigo Allergy and Immunology ROS: negative for - hives or itchy/watery eyes Hematological and Lymphatic ROS: negative for - bleeding problems, bruising or swollen lymph nodes Endocrine ROS: negative for - galactorrhea, hair pattern changes, polydipsia/polyuria or temperature intolerance Respiratory ROS: shortness of breath  Cardiovascular ROS: chest pain Gastrointestinal ROS: negative for - abdominal pain, diarrhea, hematemesis, nausea/vomiting or stool incontinence Genito-Urinary ROS: negative for - dysuria, hematuria, incontinence or urinary frequency/urgency Musculoskeletal ROS: back pain Neurological ROS: as noted in HPI Dermatological ROS: negative for rash and skin lesion changes  Physical Examination: Blood pressure (!) 121/54, pulse 89, temperature 98.2 F (36.8 C), temperature source Axillary, resp. rate 18, height 5' (1.524 m), weight 51.7 kg (114 lb), SpO2 96 %.  HEENT-  Normocephalic, no lesions, without obvious abnormality.  Normal external eye and  conjunctiva.  Normal TM's bilaterally.  Normal auditory canals and external ears. Normal external nose, mucus membranes and septum.  Normal pharynx. Cardiovascular- S1, S2 normal, pulses palpable throughout   Lungs- chest clear, no wheezing, rales, normal symmetric air entry Abdomen- soft, non-tender; bowel sounds normal; no masses,  no organomegaly Extremities- no edema Lymph-no adenopathy palpable Musculoskeletal-no joint tenderness, deformity or swelling Skin-warm and dry, no hyperpigmentation, vitiligo, or suspicious lesions  Neurological Examination   Mental Status: Alert, oriented, thought content appropriate.  Speech fluent without evidence of aphasia.  Able to follow 3 step commands without difficulty. Cranial Nerves: II: Discs flat bilaterally; Visual fields grossly normal, pupils equal, round, reactive to light and accommodation III,IV, VI: ptosis not present, extra-ocular motions intact bilaterally V,VII: smile symmetric, facial light touch sensation normal bilaterally VIII: hearing normal bilaterally IX,X: gag reflex present XI: bilateral shoulder shrug XII: midline tongue extension Motor: Right : Upper extremity   5/5    Left:     Upper extremity   5/5  Lower extremity   4-/5     Lower extremity   5/5 Tone and bulk:normal tone throughout; no atrophy noted Sensory: Pinprick and light touch decreased in the RLE Deep Tendon Reflexes: 2+ and symmetric with absent AJ's bilaterally Plantars: Right: downgoing   Left: downgoing Cerebellar: Normal finger-to-nose and normal heel-to-shin testing bilaterally Gait: not tested due to safety concerns    Laboratory Studies:  Basic Metabolic Panel: Recent Labs  Lab 12/08/17 1316  NA 137  K 4.2  CL 101  CO2 28  GLUCOSE 157*  BUN 42*  CREATININE 0.98  CALCIUM 9.6    Liver Function Tests: No results for input(s): AST, ALT, ALKPHOS, BILITOT, PROT, ALBUMIN in the last 168 hours. No results for input(s): LIPASE, AMYLASE in the  last 168 hours. No results for input(s): AMMONIA in the last 168 hours.  CBC: Recent Labs  Lab 12/08/17 1316  WBC 5.1  HGB 10.1*  HCT 32.2*  MCV 79.2*  PLT 269    Cardiac Enzymes: Recent Labs  Lab 12/08/17 1316 12/08/17 1848 12/09/17 0029  TROPONINI 0.07* 0.07* 0.07*    BNP: Invalid input(s): POCBNP  CBG: Recent Labs  Lab 12/08/17 2219 12/09/17 0814 12/09/17  1204  GLUCAP 114* 82 170*    Microbiology: Results for orders placed or performed during the hospital encounter of 10/08/16  Blood culture (routine x 2)     Status: None   Collection Time: 10/08/16  9:57 AM  Result Value Ref Range Status   Specimen Description BLOOD LEFT FOREARM  Final   Special Requests   Final    BOTTLES DRAWN AEROBIC AND ANAEROBIC  AEROBIC 14CC, ANAEROBIC 12CC   Culture NO GROWTH 5 DAYS  Final   Report Status 10/13/2016 FINAL  Final  Blood culture (routine x 2)     Status: None   Collection Time: 10/08/16  9:57 AM  Result Value Ref Range Status   Specimen Description BLOOD LEFT ASSIST CONTROL  Final   Special Requests   Final    BOTTLES DRAWN AEROBIC AND ANAEROBIC  AEROBIC 17CC, ANAEROBIC 13CC   Culture NO GROWTH 5 DAYS  Final   Report Status 10/13/2016 FINAL  Final  Urine culture     Status: None   Collection Time: 10/08/16 11:51 AM  Result Value Ref Range Status   Specimen Description URINE, RANDOM  Final   Special Requests NONE  Final   Culture NO GROWTH Performed at [facility]   Final   Report Status 10/09/2016 FINAL  Final    Coagulation Studies: Recent Labs    12/08/17 1315  LABPROT 33.2*  INR 3.29    Urinalysis: No results for input(s): COLORURINE, LABSPEC, PHURINE, GLUCOSEU, HGBUR, BILIRUBINUR, KETONESUR, PROTEINUR, UROBILINOGEN, NITRITE, LEUKOCYTESUR in the last 168 hours.  Invalid input(s): APPERANCEUR  Lipid Panel: No results found for: CHOL, TRIG, HDL, CHOLHDL, VLDL, LDLCALC  HgbA1C:  Lab Results  Component Value Date   HGBA1C 8.0 (H)  01/15/2014    Urine Drug Screen:  No results found for: LABOPIA, COCAINSCRNUR, LABBENZ, AMPHETMU, THCU, LABBARB  Alcohol Level: No results for input(s): ETH in the last 168 hours.  Other results: EKG: sinus rhythm at 85 bpm.  Imaging: Dg Chest 2 View  Result Date: 12/08/2017 CLINICAL DATA:  Chest pain, confusion EXAM: CHEST  2 VIEW COMPARISON:  CT chest dated 10/08/2016 FINDINGS: Lungs are essentially clear. No frank interstitial edema. No pleural effusion or pneumothorax. Cardiomegaly.  Postsurgical changes related to prior CABG. Degenerative changes of the visualized thoracolumbar spine. Mild to moderate compression fracture deformities of two midthoracic vertebral bodies, chronic. Median sternotomy. IMPRESSION: No evidence of acute cardiopulmonary disease. Electronically Signed   By: Charline Bills M.D.   On: 12/08/2017 14:38   Ct Head Wo Contrast  Result Date: 12/09/2017 CLINICAL DATA:  Neurological benefits deficits, She continues to note right lower extremity weakness with associated cold sensation. EXAM: CT HEAD WITHOUT CONTRAST TECHNIQUE: Contiguous axial images were obtained from the base of the skull through the vertex without intravenous contrast. COMPARISON:  09/09/2016 FINDINGS: Brain: No evidence of acute infarction, hemorrhage, extra-axial collection, ventriculomegaly, or mass effect. Old small left cerebellar infarct. Generalized cerebral atrophy. Periventricular white matter low attenuation likely secondary to microangiopathy. Vascular: Cerebrovascular atherosclerotic calcifications are noted. Skull: Negative for fracture or focal lesion. Sinuses/Orbits: Visualized portions of the orbits are unremarkable. Visualized portions of the paranasal sinuses and mastoid air cells are unremarkable. Other: None. IMPRESSION: 1. No acute intracranial pathology. 2. Chronic microvascular disease and cerebral atrophy. Electronically Signed   By: Elige Ko   On: 12/09/2017 11:20     Assessment: 84 y.o. female presenting with chest pain/SOB who reports waking up with RLE weakness and numbness on yesterday.  Patient on ASA and Coumadin with an INR of 3.29 on admission.  Head CT reviewed and shows no acute changes.  No evidence of hemorrhage.  MRI unable to be performed due to valve.  Although small infarct on the differential, patient on maximal medical therapy at this time.  Echocardiogram pending.  Patient also complains of back pain.  Can not rule out low back pathology.  Unclear if any major intervention would be indicated since patient would have to have Coumadin discontinued for a short period of time and patient already limited to w/c mobility and transfers.    Stroke Risk Factors - diabetes mellitus, hyperlipidemia and hypertension  Plan: 1. HgbA1c, fasting lipid panel 2. PT consult, OT consult, Speech consult 3. CT of the lumbar spine 4. Agree with continued ASA and Coumadin 5. Telemetry monitoring 6. Frequent neuro checks   Thana Farr, MD Neurology [phone] 12/09/2017, 12:05 PM

## 2017-12-09 NOTE — Progress Notes (Signed)
[employer] [facility] [place] [facility]   PATIENT NAME: Amanda [name]    MR#:  [id number]  DATE OF BIRTH:  10-16-1925  SUBJECTIVE:  CHIEF COMPLAINT:   Chief Complaint  Patient presents with  . Chest Pain  . Shortness of Breath  no complaints REVIEW OF SYSTEMS:  Review of Systems  Constitutional: Negative for chills, fever and weight loss.  HENT: Negative for nosebleeds and sore throat.   Eyes: Negative for blurred vision.  Respiratory: Negative for cough, shortness of breath and wheezing.   Cardiovascular: Negative for chest pain, orthopnea, leg swelling and PND.  Gastrointestinal: Negative for abdominal pain, constipation, diarrhea, heartburn, nausea and vomiting.  Genitourinary: Negative for dysuria and urgency.  Musculoskeletal: Negative for back pain.  Skin: Negative for rash.  Neurological: Negative for dizziness, speech change, focal weakness and headaches.  Endo/Heme/Allergies: Does not bruise/bleed easily.  Psychiatric/Behavioral: Negative for depression.    DRUG ALLERGIES:   Allergies  Allergen Reactions  . Codeine     REACTION: makes her "crazy"  . Penicillins Hives    Has patient had a PCN reaction causing immediate rash, facial/tongue/throat swelling, SOB or lightheadedness with hypotension: Yes Has patient had a PCN reaction causing severe rash involving mucus membranes or skin necrosis: Unknown Has patient had a PCN reaction that required hospitalization: No Has patient had a PCN reaction occurring within the last 10 years: No If all of the above answers are "NO", then may proceed with Cephalosporin use.   . Tramadol     Hallucinations   VITALS:  Blood pressure (!) 129/46, pulse 87, temperature 97.8 F (36.6 C), temperature source Oral, resp. rate 17, height 5' (1.524 m), weight 51.7 kg (114 lb), SpO2 94 %. PHYSICAL EXAMINATION:  Physical Exam  Constitutional: She is oriented to person, place, and time and well-developed,  well-nourished, and in no distress.  HENT:  Head: Normocephalic and atraumatic.  Eyes: Conjunctivae and EOM are normal. Pupils are equal, round, and reactive to light.  Neck: Normal range of motion. Neck supple. No tracheal deviation present. No thyromegaly present.  Cardiovascular: Normal rate, regular rhythm and normal heart sounds.  Pulmonary/Chest: Effort normal and breath sounds normal. No respiratory distress. She has no wheezes. She exhibits no tenderness.  Abdominal: Soft. Bowel sounds are normal. She exhibits no distension. There is no tenderness.  Musculoskeletal: Normal range of motion.  Neurological: She is alert and oriented to person, place, and time. No cranial nerve deficit.  Skin: Skin is warm and dry. No rash noted.  Psychiatric: Mood and affect normal.   LABORATORY PANEL:  Female CBC Recent Labs  Lab 12/08/17 1316  WBC 5.1  HGB 10.1*  HCT 32.2*  PLT 269   ------------------------------------------------------------------------------------------------------------------ Chemistries  Recent Labs  Lab 12/08/17 1316  NA 137  K 4.2  CL 101  CO2 28  GLUCOSE 157*  BUN 42*  CREATININE 0.98  CALCIUM 9.6   RADIOLOGY:  Ct Head Wo Contrast  Result Date: 12/09/2017 CLINICAL DATA:  Neurological benefits deficits, She continues to note right lower extremity weakness with associated cold sensation. EXAM: CT HEAD WITHOUT CONTRAST TECHNIQUE: Contiguous axial images were obtained from the base of the skull through the vertex without intravenous contrast. COMPARISON:  09/09/2016 FINDINGS: Brain: No evidence of acute infarction, hemorrhage, extra-axial collection, ventriculomegaly, or mass effect. Old small left cerebellar infarct. Generalized cerebral atrophy. Periventricular white matter low attenuation likely secondary to microangiopathy. Vascular: Cerebrovascular atherosclerotic calcifications are noted. Skull: Negative for fracture or focal lesion.  Sinuses/Orbits: Visualized  portions of the orbits are unremarkable. Visualized portions of the paranasal sinuses and mastoid air cells are unremarkable. Other: None. IMPRESSION: 1. No acute intracranial pathology. 2. Chronic microvascular disease and cerebral atrophy. Electronically Signed   By: Elige Ko   On: 12/09/2017 11:20   Ct Lumbar Spine Wo Contrast  Result Date: 12/09/2017 CLINICAL DATA:  Back pain with radiculopathy. Right-sided weakness beginning yesterday. EXAM: CT LUMBAR SPINE WITHOUT CONTRAST TECHNIQUE: Multidetector CT imaging of the lumbar spine was performed without intravenous contrast administration. Multiplanar CT image reconstructions were also generated. COMPARISON:  CT 10/08/2016 FINDINGS: Segmentation: 5 lumbar type vertebral bodies. Alignment: Normal Vertebrae: When compared to the study of 2017, there are old partial compression fractures at T11, T12, L1, and L4. There is a newly seen (since 2017) superior endplate fracture at L3 which could be recent or older. Paraspinal and other soft tissues: Atelectasis and/or scarring at both lung bases. Aortic atherosclerosis. Disc levels: T11-12: Normal. T12-L1: Normal. L1-2: Normal. L2-3: Normal. L3-4: Mild bulging of the disc. Mild facet and ligamentous hypertrophy. Mild to moderate multifactorial stenosis at this level with some potential for neural compression. L4-5: Circumferential bulging of the disc. Facet and ligamentous hypertrophy. Moderate to severe multifactorial spinal stenosis that could cause neural compression. L5-S1: Fusion across this disc space. Wide patency of the canal and foramina. IMPRESSION: Moderate to marked multifactorial stenosis at the L4-5 level that could cause neural compressive symptoms. Mild to moderate multifactorial stenosis at L3-4 that could also possibly be symptomatic. Old partial compression fractures at T11, T12, L1 and L4. Since the previous imaging study of 10/08/2016, there is a newly seen superior endplate fracture at L3  with loss of height of only about 20% and mild posterior bowing of the posterosuperior vertebral body but no apparent compressive stenosis. I cannot tell with certainty if this is healed or more recent, only that the deformity is new since 2017. Electronically Signed   By: Paulina Fusi M.D.   On: 12/09/2017 15:05   ASSESSMENT AND PLAN:   1. Elevated troponin: -due to demand ischemia -no invasive work up per cardio which is very appropriate  2 chronic systolic congestive heart failure without exacerbation most recent echocardiogram noted for ejection fraction 40-45% Stable on current regiment which will be continued  3 chronic diabetes mellitus type 2, controlled Continue home regiment, ADA/cardiac diet, with Accu-Cheks per routine  4 chronic benign essential hypertension Stable Continue home regimen  5 chronic hyperlipidemia, unspecified Continue statin therapy   6 chronic artificial heart valve Stable Continue Coumadin, check PT/INR daily INR currently 3.2  7. CAD -Continue ASA, Toprol, Crestor  8 Right lower extremity weakness: -may be due to thoraco-lumbar dz than CVA - CT head neg - CT lumbar spine shows multilevel stenosis, will c/s neurosurgery - may benefit from brace?    All the records are reviewed and case discussed with Care Management/Social Worker. Management plans discussed with the patient, nursing and they are in agreement.  CODE STATUS: DNR  TOTAL TIME TAKING CARE OF THIS PATIENT: 25 minutes.   More than 50% of the time was spent in counseling/coordination of care: YES  POSSIBLE D/C IN 1-2 DAYS, DEPENDING ON CLINICAL CONDITION.   Delfino Lovett M.D on 12/09/2017 at 9:19 PM  Between 7am to 6pm - Pager - [phone]  After 6pm go to [email or web address] - Social research officer, government  [facility]  Office  [phone]  CC: Primary care physician; Tenna Delaine, FNP  Note:  This dictation was prepared with Dragon dictation along  with smaller phrase technology. Any transcriptional errors that result from this process are unintentional.

## 2017-12-09 NOTE — Progress Notes (Signed)
PT Cancellation Note  Patient Details Name: Amanda [name] MRN: [id number] DOB: 1925/02/13   Cancelled Treatment:    Reason Eval/Treat Not Completed: Other (comment).  Pt presented with chest pain with elevated troponin and differential diagnosis of NSTEMI.  Pending cardiac consult.  Will hold PT until cardiac consult completed and POC established.    Encarnacion ChuAshley Abashian PT, DPT 12/09/2017, 9:17 AM

## 2017-12-10 DIAGNOSIS — R29898 Other symptoms and signs involving the musculoskeletal system: Secondary | ICD-10-CM | POA: Diagnosis not present

## 2017-12-10 DIAGNOSIS — Z952 Presence of prosthetic heart valve: Secondary | ICD-10-CM | POA: Diagnosis not present

## 2017-12-10 DIAGNOSIS — M48061 Spinal stenosis, lumbar region without neurogenic claudication: Secondary | ICD-10-CM | POA: Diagnosis not present

## 2017-12-10 DIAGNOSIS — I25119 Atherosclerotic heart disease of native coronary artery with unspecified angina pectoris: Secondary | ICD-10-CM

## 2017-12-10 DIAGNOSIS — I255 Ischemic cardiomyopathy: Secondary | ICD-10-CM

## 2017-12-10 DIAGNOSIS — R748 Abnormal levels of other serum enzymes: Secondary | ICD-10-CM

## 2017-12-10 LAB — HEMOGLOBIN A1C
Hgb A1c MFr Bld: 7.2 % — ABNORMAL HIGH (ref 4.8–5.6)
MEAN PLASMA GLUCOSE: 159.94 mg/dL

## 2017-12-10 LAB — GLUCOSE, CAPILLARY
GLUCOSE-CAPILLARY: 130 mg/dL — AB (ref 65–99)
Glucose-Capillary: 153 mg/dL — ABNORMAL HIGH (ref 65–99)
Glucose-Capillary: 166 mg/dL — ABNORMAL HIGH (ref 65–99)

## 2017-12-10 LAB — PROTIME-INR
INR: 2.03
PROTHROMBIN TIME: 22.8 s — AB (ref 11.4–15.2)

## 2017-12-10 LAB — CBC
HCT: 31.2 % — ABNORMAL LOW (ref 35.0–47.0)
Hemoglobin: 9.9 g/dL — ABNORMAL LOW (ref 12.0–16.0)
MCH: 24.7 pg — ABNORMAL LOW (ref 26.0–34.0)
MCHC: 31.6 g/dL — ABNORMAL LOW (ref 32.0–36.0)
MCV: 78.2 fL — AB (ref 80.0–100.0)
PLATELETS: 247 10*3/uL (ref 150–440)
RBC: 3.99 MIL/uL (ref 3.80–5.20)
RDW: 16.5 % — AB (ref 11.5–14.5)
WBC: 5.6 10*3/uL (ref 3.6–11.0)

## 2017-12-10 LAB — BASIC METABOLIC PANEL
ANION GAP: 6 (ref 5–15)
BUN: 39 mg/dL — AB (ref 6–20)
CALCIUM: 10 mg/dL (ref 8.9–10.3)
CO2: 29 mmol/L (ref 22–32)
Chloride: 104 mmol/L (ref 101–111)
Creatinine, Ser: 0.9 mg/dL (ref 0.44–1.00)
GFR calc Af Amer: >60 mL/min (ref >60)
GFR, EST NON AFRICAN AMERICAN: 55 mL/min — AB (ref >60)
GLUCOSE: 136 mg/dL — AB (ref 65–99)
Potassium: 4.1 mmol/L (ref 3.5–5.1)
SODIUM: 139 mmol/L (ref 135–145)

## 2017-12-10 LAB — LIPID PANEL
CHOL/HDL RATIO: 2.5 ratio
Cholesterol: 104 mg/dL (ref 0–200)
HDL: 42 mg/dL (ref >40)
LDL Cholesterol: 48 mg/dL (ref 0–99)
Triglycerides: 71 mg/dL (ref <150)
VLDL: 14 mg/dL (ref 0–40)

## 2017-12-10 LAB — APTT: aPTT: 33 seconds (ref 24–36)

## 2017-12-10 LAB — HEPARIN LEVEL (UNFRACTIONATED): HEPARIN UNFRACTIONATED: 0.7 [IU]/mL (ref 0.30–0.70)

## 2017-12-10 MED ORDER — WARFARIN SODIUM 6 MG PO TABS: 6.0000 mg | ORAL_TABLET | ORAL | Status: DC

## 2017-12-10 MED ORDER — MORPHINE SULFATE 15 MG PO TABS: 15.0000 mg | ORAL_TABLET | ORAL | 0 refills | Status: AC | PRN

## 2017-12-10 MED ORDER — WARFARIN SODIUM 3 MG PO TABS: 3.0000 mg | ORAL_TABLET | ORAL | Status: DC

## 2017-12-10 MED ORDER — WARFARIN SODIUM 6 MG PO TABS
6.0000 mg | ORAL_TABLET | ORAL | Status: DC
  Administered 2017-12-10: 6 mg via ORAL
  Filled 2017-12-10: qty 1

## 2017-12-10 MED ORDER — HEPARIN (PORCINE) IN NACL 100-0.45 UNIT/ML-% IJ SOLN
750.0000 [IU]/h | INTRAMUSCULAR | Status: DC
  Administered 2017-12-10: 750 [IU]/h via INTRAVENOUS
  Filled 2017-12-10: qty 250

## 2017-12-10 MED ORDER — HEPARIN BOLUS VIA INFUSION
3000.0000 [IU] | Freq: Once | INTRAVENOUS | Status: AC
  Administered 2017-12-10: 3000 [IU] via INTRAVENOUS
  Filled 2017-12-10: qty 3000

## 2017-12-10 MED ORDER — LORAZEPAM 0.5 MG PO TABS: 1.0000 mg | ORAL_TABLET | Freq: Three times a day (TID) | ORAL | 0 refills | Status: AC | PRN

## 2017-12-10 NOTE — Progress Notes (Signed)
ANTICOAGULATION CONSULT NOTE - Initial Consult  Pharmacy Consult for heparin and warfarin Indication: mechanical mitral valve  Allergies  Allergen Reactions  . Codeine     REACTION: makes her "crazy"  . Penicillins Hives    Has patient had a PCN reaction causing immediate rash, facial/tongue/throat swelling, SOB or lightheadedness with hypotension: Yes Has patient had a PCN reaction causing severe rash involving mucus membranes or skin necrosis: Unknown Has patient had a PCN reaction that required hospitalization: No Has patient had a PCN reaction occurring within the last 10 years: No If all of the above answers are "NO", then may proceed with Cephalosporin use.   . Tramadol     Hallucinations    Patient Measurements: Height: 5' (152.4 cm) Weight: 114 lb (51.7 kg) IBW/kg (Calculated) : 45.5 Heparin Dosing Weight:   Vital Signs: Temp: 98.3 F (36.8 C) (02/20 0753) BP: 141/58 (02/20 0753) Pulse Rate: 84 (02/20 0753)  Labs: Recent Labs    12/08/17 1315 12/08/17 1316 12/08/17 1848 12/09/17 0029 12/10/17 0522  HGB  --  10.1*  --   --  9.9*  HCT  --  32.2*  --   --  31.2*  PLT  --  269  --   --  247  LABPROT 33.2*  --   --   --  22.8*  INR 3.29  --   --   --  2.03  CREATININE  --  0.98  --   --  0.90  TROPONINI  --  0.07* 0.07* 0.07*  --     Estimated Creatinine Clearance: 29.8 mL/min (by C-G formula based on SCr of 0.9 mg/dL).   Medical History: Past Medical History:  Diagnosis Date  . Chronic systolic CHF (congestive heart failure) (HCC)   . Coronary artery disease   . Diabetes mellitus without complication (HCC)   . Hyperlipidemia   . Hypertension   . Mitral valve disease   . NSTEMI (non-ST elevated myocardial infarction) (HCC) 01/15/2014    Medications:  Infusions:  . heparin      Assessment: 89 yof cc CP/SOB. Elevated troponin d/t demand ischemia, LVEF 40 to 45%. Cardiology recommends no invasive work up. Today INR was noted to be less than 2.5 so  cardiology consulted pharmacy to dose UFH while INR re-equilibrates (dose on 12/08/17 ordered but not documented).   PTA Warfarin schedule is listed a 3 mg po Monday, Wednesday, Friday, Sunday and 6 mg po Tuesday, Thursday, and Saturday.  DATE INR DOSE 2/18 3.8 Not documented 2/19 3.29 6 mg 2/20 2.03 6 mg  Goal of Therapy:  Heparin level 0.3-0.7 units/ml Monitor platelets by anticoagulation protocol: Yes   Plan:  Give 3000 units bolus x 1 Start heparin infusion at 750 units/hr Check anti-Xa level in 8 hours and daily while on heparin Continue to monitor H&H and platelets   Will also increase today's warfarin dose from 3 mg po on Wednesdays to 6 mg po on Wednesdays.   Carola FrostNathan A Jayan Raymundo, Pharm.D., BCPS Clinical Pharmacist 12/10/2017,8:26 AM

## 2017-12-10 NOTE — Care Management Obs Status (Deleted)
MEDICARE OBSERVATION STATUS NOTIFICATION   Patient Details  Name: [place] MRN: [id number] Date of Birth: [birth year]   Medicare Observation Status Notification Given:  Yes    Eber HongGreene, Jannah Guardiola R, RN 12/10/2017, 9:08 AM

## 2017-12-10 NOTE — Progress Notes (Signed)
Subjective: Patient stable.  No new neurological complaints.    Objective: Current vital signs: BP (!) 141/58 (BP Location: Left Arm)   Pulse 84   Temp 98.3 F (36.8 C)   Resp 16   Ht 5' (1.524 m)   Wt 51.7 kg (114 lb)   SpO2 99%   BMI 22.26 kg/m  Vital signs in last 24 hours: Temp:  [97.8 F (36.6 C)-98.3 F (36.8 C)] 98.3 F (36.8 C) (02/20 0753) Pulse Rate:  [84-88] 84 (02/20 0753) Resp:  [16-20] 16 (02/20 0753) BP: (106-141)/(46-58) 141/58 (02/20 0753) SpO2:  [93 %-99 %] 99 % (02/20 0753)  Intake/Output from previous day: 02/19 0701 - 02/20 0700 In: 600 [P.O.:600] Out: 200 [Urine:200] Intake/Output this shift: No intake/output data recorded. Nutritional status: Diet Heart Room service appropriate? Yes; Fluid consistency: Thin Diet - low sodium heart healthy  Neurologic Exam: Mental Status: Alert, oriented, thought content appropriate.  Speech fluent without evidence of aphasia.  Able to follow 3 step commands without difficulty. Cranial Nerves: II: Discs flat bilaterally; Visual fields grossly normal, pupils equal, round, reactive to light and accommodation III,IV, VI: ptosis not present, extra-ocular motions intact bilaterally V,VII: smile symmetric, facial light touch sensation normal bilaterally VIII: hearing normal bilaterally IX,X: gag reflex present XI: bilateral shoulder shrug XII: midline tongue extension Motor: Right :  Upper extremity   5/5                                      Left:     Upper extremity   5/5             Lower extremity   4-/5                                                 Lower extremity   5/5   Lab Results: Basic Metabolic Panel: Recent Labs  Lab 12/08/17 1316 12/10/17 0522  NA 137 139  K 4.2 4.1  CL 101 104  CO2 28 29  GLUCOSE 157* 136*  BUN 42* 39*  CREATININE 0.98 0.90  CALCIUM 9.6 10.0    Liver Function Tests: No results for input(s): AST, ALT, ALKPHOS, BILITOT, PROT, ALBUMIN in the last 168 hours. No results for  input(s): LIPASE, AMYLASE in the last 168 hours. No results for input(s): AMMONIA in the last 168 hours.  CBC: Recent Labs  Lab 12/08/17 1316 12/10/17 0522  WBC 5.1 5.6  HGB 10.1* 9.9*  HCT 32.2* 31.2*  MCV 79.2* 78.2*  PLT 269 247    Cardiac Enzymes: Recent Labs  Lab 12/08/17 1316 12/08/17 1848 12/09/17 0029  TROPONINI 0.07* 0.07* 0.07*    Lipid Panel: Recent Labs  Lab 12/10/17 0522  CHOL 104  TRIG 71  HDL 42  CHOLHDL 2.5  VLDL 14  LDLCALC 48    CBG: Recent Labs  Lab 12/09/17 0814 12/09/17 1204 12/09/17 1711 12/09/17 2044 12/10/17 0733  GLUCAP 82 170* 135* 234* 130*    Microbiology: Results for orders placed or performed during the hospital encounter of 10/08/16  Blood culture (routine x 2)     Status: None   Collection Time: 10/08/16  9:57 AM  Result Value Ref Range Status   Specimen Description BLOOD LEFT FOREARM  Final   Special  Requests   Final    BOTTLES DRAWN AEROBIC AND ANAEROBIC  AEROBIC 14CC, ANAEROBIC 12CC   Culture NO GROWTH 5 DAYS  Final   Report Status 10/13/2016 FINAL  Final  Blood culture (routine x 2)     Status: None   Collection Time: 10/08/16  9:57 AM  Result Value Ref Range Status   Specimen Description BLOOD LEFT ASSIST CONTROL  Final   Special Requests   Final    BOTTLES DRAWN AEROBIC AND ANAEROBIC  AEROBIC 17CC, ANAEROBIC 13CC   Culture NO GROWTH 5 DAYS  Final   Report Status 10/13/2016 FINAL  Final  Urine culture     Status: None   Collection Time: 10/08/16 11:51 AM  Result Value Ref Range Status   Specimen Description URINE, RANDOM  Final   Special Requests NONE  Final   Culture NO GROWTH Performed [facility]   Final   Report Status 10/09/2016 FINAL  Final    Coagulation Studies: Recent Labs    12/08/17 1315 12/10/17 0522  LABPROT 33.2* 22.8*  INR 3.29 2.03    Imaging: Dg Chest 2 View  Result Date: 12/08/2017 CLINICAL DATA:  Chest pain, confusion EXAM: CHEST  2 VIEW COMPARISON:  CT chest  dated 10/08/2016 FINDINGS: Lungs are essentially clear. No frank interstitial edema. No pleural effusion or pneumothorax. Cardiomegaly.  Postsurgical changes related to prior CABG. Degenerative changes of the visualized thoracolumbar spine. Mild to moderate compression fracture deformities of two midthoracic vertebral bodies, chronic. Median sternotomy. IMPRESSION: No evidence of acute cardiopulmonary disease. Electronically Signed   By: Charline BillsSriyesh  Krishnan M.D.   On: 12/08/2017 14:38   Ct Head Wo Contrast  Result Date: 12/09/2017 CLINICAL DATA:  Neurological benefits deficits, She continues to note right lower extremity weakness with associated cold sensation. EXAM: CT HEAD WITHOUT CONTRAST TECHNIQUE: Contiguous axial images were obtained from the base of the skull through the vertex without intravenous contrast. COMPARISON:  09/09/2016 FINDINGS: Brain: No evidence of acute infarction, hemorrhage, extra-axial collection, ventriculomegaly, or mass effect. Old small left cerebellar infarct. Generalized cerebral atrophy. Periventricular white matter low attenuation likely secondary to microangiopathy. Vascular: Cerebrovascular atherosclerotic calcifications are noted. Skull: Negative for fracture or focal lesion. Sinuses/Orbits: Visualized portions of the orbits are unremarkable. Visualized portions of the paranasal sinuses and mastoid air cells are unremarkable. Other: None. IMPRESSION: 1. No acute intracranial pathology. 2. Chronic microvascular disease and cerebral atrophy. Electronically Signed   By: Elige KoHetal  Patel   On: 12/09/2017 11:20   Ct Lumbar Spine Wo Contrast  Result Date: 12/09/2017 CLINICAL DATA:  Back pain with radiculopathy. Right-sided weakness beginning yesterday. EXAM: CT LUMBAR SPINE WITHOUT CONTRAST TECHNIQUE: Multidetector CT imaging of the lumbar spine was performed without intravenous contrast administration. Multiplanar CT image reconstructions were also generated. COMPARISON:  CT  10/08/2016 FINDINGS: Segmentation: 5 lumbar type vertebral bodies. Alignment: Normal Vertebrae: When compared to the study of 2017, there are old partial compression fractures at T11, T12, L1, and L4. There is a newly seen (since 2017) superior endplate fracture at L3 which could be recent or older. Paraspinal and other soft tissues: Atelectasis and/or scarring at both lung bases. Aortic atherosclerosis. Disc levels: T11-12: Normal. T12-L1: Normal. L1-2: Normal. L2-3: Normal. L3-4: Mild bulging of the disc. Mild facet and ligamentous hypertrophy. Mild to moderate multifactorial stenosis at this level with some potential for neural compression. L4-5: Circumferential bulging of the disc. Facet and ligamentous hypertrophy. Moderate to severe multifactorial spinal stenosis that could cause neural compression. L5-S1: Fusion  across this disc space. Wide patency of the canal and foramina. IMPRESSION: Moderate to marked multifactorial stenosis at the L4-5 level that could cause neural compressive symptoms. Mild to moderate multifactorial stenosis at L3-4 that could also possibly be symptomatic. Old partial compression fractures at T11, T12, L1 and L4. Since the previous imaging study of 10/08/2016, there is a newly seen superior endplate fracture at L3 with loss of height of only about 20% and mild posterior bowing of the posterosuperior vertebral body but no apparent compressive stenosis. I cannot tell with certainty if this is healed or more recent, only that the deformity is new since 2017. Electronically Signed   By: Paulina Fusi M.D.   On: 12/09/2017 15:05    Medications:  I have reviewed the patient's current medications. Scheduled: . aspirin EC  81 mg Oral Daily  . benazepril  20 mg Oral Daily  . cholecalciferol  5,000 Units Oral Daily  . ezetimibe  10 mg Oral Daily  . insulin aspart  4 Units Subcutaneous TID WC  . insulin glargine  25 Units Subcutaneous Daily  . isosorbide mononitrate  15 mg Oral Daily   . levothyroxine  25 mcg Oral QAC breakfast  . liraglutide  1.8 mg Subcutaneous QHS  . loratadine  10 mg Oral Daily  . metoprolol succinate  75 mg Oral Daily  . pantoprazole  40 mg Oral BID AC  . polyethylene glycol  17 g Oral Daily  . protein supplement shake  2 oz Oral BID BM  . rosuvastatin  40 mg Oral q1800  . warfarin  6 mg Oral Q Wed-1800   And  . [START ON 12/14/2017] warfarin  3 mg Oral Q Sun-1800   And  . [START ON 12/15/2017] warfarin  3 mg Oral Q Mon-1800   And  . [START ON 12/16/2017] warfarin  6 mg Oral Q Tue-1800   And  . [START ON 12/11/2017] warfarin  6 mg Oral Q Thu-1800   And  . [START ON 12/12/2017] warfarin  3 mg Oral Q Fri-1800   And  . [START ON 12/13/2017] warfarin  6 mg Oral Q Sat-1800  . Warfarin - Physician Dosing Inpatient   Does not apply q1800    Assessment/Plan: Patient stable.  CT of the lumbar spine reviewed and shows multifactorial stenosis at multiple levels at multiple levels including L4-L5 and L3-L4.  There are also multiple old partial compression fractures at T11 T12-L1 and L4 with a somewhat new endplate fracture at L3.  Although there may be some contribution of her lumbar pathology that is leading to her right lower extremity complaints it is still no way to unequivocally rule out the possibility of an intracerebral lesion as well.  At this point patient is on maximal medical therapy for stroke prevention.  Has spoken with the family they do not want any aggressive treatment for her low back pathology.  Patient is mostly wheelchair-bound at this point.  Have spoken with Dr. Clelia Croft and patient is to return home with hospice.  No further neurologic intervention is recommended at this time.  If further questions arise, please call or page at that time.  Thank you for allowing neurology to participate in the care of this patient.  Thana Farr, MD Neurology [phone] 12/10/2017  9:48 AM   LOS: 0 days

## 2017-12-10 NOTE — Care Management (Signed)
Informed by attending that patient will not benefit from skilled nursing placement.  He has discussed hospice services at home with patient and she is in agreement.  CM has reached out to patient's niece Crosby OysterKay Spoon and left voicemail to discuss this plan of care and offer agency .  Reaching out to [facility] to determine if agency provides services in [place]. Attending says hospice diagnosis is related to patient's artificial heart valve and progressive spine deterioration causing patient to be bed/wheelchair bound

## 2017-12-10 NOTE — Clinical Social Work Note (Signed)
CSW received referral for SNF.  Case discussed with case manager and plan is to discharge home with home hospice.  CSW to sign off please re-consult if social work needs arise.  Daziya Redmond R. Bostyn Bogie, MSW, LCSWA [phone]  

## 2017-12-10 NOTE — Care Management (Addendum)
Spoke with patient's HCPOA- kay spoon and she is in agreement with home hospice and agency preference is "whoever can provide service in [place]."  [facility].  Joyce GrossKay will transport patient home but will not get off work until after 5p so it will be around 7pm before she arrives to transport home. Updated primary nurse and patient. Confirmed all contact information.  Joyce GrossKay says will not have any issues getting patient into the house.  No immediate equipment needs.  Requested portable DNR on the chart.

## 2017-12-10 NOTE — Consult Note (Signed)
Referring Physician:  No referring provider defined for this encounter.  Primary Physician:  Tenna DelaineJulich, Cynthia C, FNP  Chief Complaint:  Right leg weakness  History of Present Illness: Amanda [name] is a 84 y.o. female who presents with the chief complaint of right leg weakness that she states started approximately 2 days ago after arriving to the hospital. States she stood up at some point and her right leg felt weak. Also complains of chronic back pain that mostly presents when ambulating and has resulted in having to use a wheelchair. Pain resolves when laying down and sitting.  Currently denies bladder/bowel dysfunction, saddle paresthesia, back pain, lower extremity pain/radiation, left lower extremity weakness.   Review of Systems:  A 10 point review of systems is negative, except for the pertinent positives and negatives detailed in the HPI.  Past Medical History: Past Medical History:  Diagnosis Date  . Chronic systolic CHF (congestive heart failure) (HCC)   . Coronary artery disease   . Diabetes mellitus without complication (HCC)   . Hyperlipidemia   . Hypertension   . Mitral valve disease   . NSTEMI (non-ST elevated myocardial infarction) (HCC) 01/15/2014    Past Surgical History: Past Surgical History:  Procedure Laterality Date  . CARDIAC CATHETERIZATION    . CORONARY ARTERY BYPASS GRAFT    . LEFT HEART CATHETERIZATION WITH CORONARY ANGIOGRAM N/A 01/17/2014   Procedure: LEFT HEART CATHETERIZATION WITH CORONARY ANGIOGRAM;  Surgeon: Lennette Biharihomas A Kelly, MD;  [facility];  Service: Cardiovascular;  Laterality: N/A;  . MITRAL VALVE REPLACEMENT      Allergies: Allergies as of 12/08/2017 - Review Complete 12/08/2017  Allergen Reaction Noted  . Codeine    . Penicillins Hives   . Tramadol  01/15/2014    Medications:  Current Facility-Administered Medications:  .  acetaminophen (TYLENOL) tablet 650 mg, 650 mg, Oral, Q4H PRN, Salary, Montell D, MD .  aspirin EC  tablet 81 mg, 81 mg, Oral, Daily, Salary, Montell D, MD, 81 mg at 12/10/17 0842 .  benazepril (LOTENSIN) tablet 20 mg, 20 mg, Oral, Daily, Salary, Montell D, MD, 20 mg at 12/10/17 0841 .  cholecalciferol (VITAMIN D) tablet 5,000 Units, 5,000 Units, Oral, Daily, Salary, Montell D, MD, 5,000 Units at 12/10/17 [phone] .  ezetimibe (ZETIA) tablet 10 mg, 10 mg, Oral, Daily, Salary, Montell D, MD, 10 mg at 12/09/17 1804 .  furosemide (LASIX) tablet 20 mg, 20 mg, Oral, Daily PRN, Salary, Montell D, MD .  gi cocktail (Maalox,Lidocaine,Donnatal), 30 mL, Oral, QID PRN, Salary, Montell D, MD .  guaiFENesin (MUCINEX) 12 hr tablet 600 mg, 600 mg, Oral, BID PRN, Salary, Montell D, MD .  heparin ADULT infusion 100 units/mL (25000 units/25550mL sodium chloride 0.45%), 750 Units/hr, Intravenous, Continuous, Sherryll BurgerShah, Vipul, MD, Last Rate: 7.5 mL/hr at 12/10/17 0843, 750 Units/hr at 12/10/17 0843 .  insulin aspart (novoLOG) injection 4 Units, 4 Units, Subcutaneous, TID WC, Salary, Montell D, MD, 4 Units at 12/10/17 [phone] .  insulin glargine (LANTUS) injection 25 Units, 25 Units, Subcutaneous, Daily, Salary, Montell D, MD, 25 Units at 12/10/17 0843 .  isosorbide mononitrate (IMDUR) 24 hr tablet 15 mg, 15 mg, Oral, Daily, Salary, Montell D, MD, 15 mg at 12/10/17 0842 .  ketorolac (TORADOL) tablet 10 mg, 10 mg, Oral, Q6H PRN, Salary, Montell D, MD, 10 mg at 12/10/17 0843 .  levothyroxine (SYNTHROID, LEVOTHROID) tablet 25 mcg, 25 mcg, Oral, QAC breakfast, Salary, Montell D, MD, 25 mcg at 12/10/17 [phone] .  liraglutide (VICTOZA) SOPN 1.8  mg, 1.8 mg, Subcutaneous, QHS, Salary, Montell D, MD, 1.8 mg at 12/09/17 2141 .  loratadine (CLARITIN) tablet 10 mg, 10 mg, Oral, Daily, Salary, Montell D, MD, 10 mg at 12/10/17 0841 .  metoprolol succinate (TOPROL-XL) 24 hr tablet 75 mg, 75 mg, Oral, Daily, Salary, Montell D, MD, 75 mg at 12/10/17 0842 .  morphine 2 MG/ML injection 2 mg, 2 mg, Intravenous, Q2H PRN, Salary, Montell D, MD .   nitroGLYCERIN (NITROSTAT) SL tablet 0.4 mg, 0.4 mg, Sublingual, Q5 min PRN, Salary, Montell D, MD .  ondansetron (ZOFRAN) injection 4 mg, 4 mg, Intravenous, Q6H PRN, Salary, Montell D, MD .  pantoprazole (PROTONIX) EC tablet 40 mg, 40 mg, Oral, BID AC, Salary, Montell D, MD, 40 mg at 12/10/17 0842 .  polyethylene glycol (MIRALAX / GLYCOLAX) packet 17 g, 17 g, Oral, Daily, Salary, Montell D, MD, 17 g at 12/10/17 0842 .  potassium chloride (K-DUR,KLOR-CON) CR tablet 10 mEq, 10 mEq, Oral, Daily PRN, Salary, Montell D, MD .  protein supplement (PREMIER PROTEIN) liquid, 2 oz, Oral, BID BM, Salary, Montell D, MD, 2 oz at 12/10/17 0848 .  rosuvastatin (CRESTOR) tablet 40 mg, 40 mg, Oral, q1800, Salary, Montell D, MD, 40 mg at 12/09/17 1804 .  [START ON 12/14/2017] warfarin (COUMADIN) tablet 3 mg, 3 mg, Oral, Q Sun-1800 **AND** [START ON 12/15/2017] warfarin (COUMADIN) tablet 3 mg, 3 mg, Oral, Q Mon-1800 **AND** [START ON 12/16/2017] warfarin (COUMADIN) tablet 6 mg, 6 mg, Oral, Q Tue-1800 **AND** warfarin (COUMADIN) tablet 6 mg, 6 mg, Oral, Q Wed-1800 **AND** [START ON 12/11/2017] warfarin (COUMADIN) tablet 6 mg, 6 mg, Oral, Q Thu-1800 **AND** [START ON 12/12/2017] warfarin (COUMADIN) tablet 3 mg, 3 mg, Oral, Q Fri-1800 **AND** [START ON 12/13/2017] warfarin (COUMADIN) tablet 6 mg, 6 mg, Oral, Q Sat-1800, Delfino Lovett, MD .  Warfarin - Physician Dosing Inpatient, , Does not apply, q1800, Salary, Evelena Asa, MD   Social History: Social History   Tobacco Use  . Smoking status: Never Smoker  . Smokeless tobacco: Current User    Types: Snuff  Substance Use Topics  . Alcohol use: No  . Drug use: No    Family Medical History: Family History  Problem Relation Age of Onset  . Heart disease Mother   . Coronary artery disease Other   . Diabetes Other   . Stroke Other     Physical Examination: Vitals:   12/10/17 0245 12/10/17 0753  BP: (!) 106/53 (!) 141/58  Pulse: 88 84  Resp: 18 16  Temp: 98.3 F (36.8  C) 98.3 F (36.8 C)  SpO2: 94% 99%     General: Patient is well developed, well nourished, calm, collected, and in no apparent distress.  Psychiatric: Patient is non-anxious.  Head:  Pupils equal, round, and reactive to light.  Respiratory: Patient is breathing without any difficulty.  Skin:   On exposed skin, there are no abnormal skin lesions.  NEUROLOGICAL:  General: In no acute distress.   Awake, alert, oriented to person, place, and time.  Pupils equal round and reactive to light.  Facial tone is symmetric. There is no pronator drift.  Palpation of spine: no tenderness to palpation of lumbar spine  Strength: Side Biceps Triceps Deltoid Interossei Grip Wrist Ext. Wrist Flex.  R 5 5 5 5 5 5 5   L 5 5 5 5 5 5 5    Side Iliopsoas Quads Hamstring PF DF EHL  R 4+ 4+ 5 5 5 5   L 5 5 5  5 5 5    Decreased sensation right posterior calf as well as dorsal and plantar surfaces of right foot. Otherwise sensation intact. Gait and tandem gait not assessed.   Imaging: EXAM: CT LUMBAR SPINE WITHOUT CONTRAST  TECHNIQUE: Multidetector CT imaging of the lumbar spine was performed without intravenous contrast administration. Multiplanar CT image reconstructions were also generated.  COMPARISON:  CT 10/08/2016  FINDINGS: Segmentation: 5 lumbar type vertebral bodies.  Alignment: Normal  Vertebrae: When compared to the study of 2017, there are old partial compression fractures at T11, T12, L1, and L4. There is a newly seen (since 2017) superior endplate fracture at L3 which could be recent or older.  Paraspinal and other soft tissues: Atelectasis and/or scarring at both lung bases. Aortic atherosclerosis.  Disc levels: T11-12: Normal.  T12-L1: Normal.  L1-2: Normal.  L2-3: Normal.  L3-4: Mild bulging of the disc. Mild facet and ligamentous hypertrophy. Mild to moderate multifactorial stenosis at this level with some potential for neural compression.  L4-5:  Circumferential bulging of the disc. Facet and ligamentous hypertrophy. Moderate to severe multifactorial spinal stenosis that could cause neural compression.  L5-S1: Fusion across this disc space. Wide patency of the canal and foramina.  IMPRESSION: Moderate to marked multifactorial stenosis at the L4-5 level that could cause neural compressive symptoms.  Mild to moderate multifactorial stenosis at L3-4 that could also possibly be symptomatic.  Old partial compression fractures at T11, T12, L1 and L4. Since the previous imaging study of 10/08/2016, there is a newly seen superior endplate fracture at L3 with loss of height of only about 20% and mild posterior bowing of the posterosuperior vertebral body but no apparent compressive stenosis. I cannot tell with certainty if this is healed or more recent, only that the deformity is new since 2017.   Assessment and Plan: Ms. Ocasio is a pleasant 84 y.o. female with new presentation of right leg weakness. Imaging was reviewed, surgical intervention not recommended at this time; especially as patient is being considered for hospice care. If back pain presents, may consider LSO brace for comfort measures.    Thank you for involving me in the care of this patient.   Ivar Drape, PA-C Dept. of Neurosurgery

## 2017-12-10 NOTE — Care Management Obs Status (Signed)
MEDICARE OBSERVATION STATUS NOTIFICATION   Patient Details  Name: Amanda [name] MRN: [id number] Date of Birth: Mar 02, 1925   Medicare Observation Status Notification Given:  Yes  CM made several attempts to have notice signed 12/09/2017 but patient not available  Eber HongGreene, Javeah Loeza R, RN 12/10/2017, 9:10 AM

## 2017-12-10 NOTE — Progress Notes (Signed)
Family Meeting Note  Advance Directive:yes  Today a meeting took place with the Patient.  The following clinical team members were present during this meeting:MD  The following were discussed:Patient's diagnosis: [age] y f with admitted for Elevated troponin, chronic systolic congestive heart failure without exacerbation, chronic diabetes mellitus type 2, chronic benign essential hypertension chronic hyperlipidemia, chronic artificial heart valve, CAD, severe multilevel thoraco-lumbar stenosis   . CHF (congestive heart failure) (HCC)   . Coronary artery disease   . Diabetes mellitus without complication (HCC)   . Hyperlipidemia   . Hypertension   . Mitral valve disease   . NSTEMI (non-ST elevated myocardial infarction) (HCC)    Patient's progosis: < 12 months and Goals for treatment: DNR  Additional follow-up to be provided: Home with Hospice [facility]  Please note, I also had long d/w patient's niece Joyce Gross(Kay) who is her POA and is in full agreement with above plan.  Time spent during discussion:20 minutes  Delfino LovettVipul Solomiya Pascale, MD

## 2017-12-10 NOTE — Discharge Instructions (Signed)
Hospice Introduction Hospice is a service that is designed to provide people who are terminally ill and their families with medical, spiritual, and psychological support. Its aim is to improve your quality of life by keeping you as alert and comfortable as possible. Who will be my providers when I begin hospice care? Hospice teams often include:  A nurse.  A doctor. The hospice doctor will be available for your care, but you can bring your regular doctor or nurse practitioner.  Social workers.  Religious leaders (such as a Clinical biochemist).  Trained volunteers.  What roles will providers play in my care? Hospice is performed by a team of health care professionals and volunteers who:  Help keep you comfortable: ? Hospice can be provided in your home or in a homelike setting. ? The hospice staff works with your family and friends to help meet your needs. ? You will enjoy the support of loved ones by receiving much of your basic care from family and friends.  Provide pain relief and manage your symptoms. The staff supply all necessary medicines and equipment.  Provide companionship when you are alone.  Allow you and your family to rest. They may do light housekeeping, prepare meals, and run errands.  Provide counseling. They will make sure your emotional, spiritual, and social needs and those of your family are being met.  Provide spiritual care: ? Spiritual care will be individualized to meet your needs and your family's needs. ? Spiritual care may involve:  Helping you look at what death means to you.  Helping you say goodbye to your family and friends.  Performing a specific religious ceremony or ritual.  When should hospice care begin? Most people who use hospice are believed to have fewer than 6 months to live.  Your family and health care providers can help you decide when hospice services should begin.  If your condition improves, you may discontinue the program.  What  should I consider before selecting a program? Most hospice programs are run by nonprofit, independent organizations. Some are affiliated with hospitals, nursing homes, or home health care agencies. Hospice programs can take place in the home or at a hospice center, hospital, or skilled nursing facility. When choosing a hospice program, ask the following questions:  What services are available to me?  What services will be offered to my loved ones?  How involved will my loved ones be?  How involved will my health care provider be?  Who makes up the hospice care team? How are they trained or screened?  How will my pain and symptoms be managed?  If my circumstances change, can the services be provided in a different setting, such as my home or in the hospital?  Is the program reviewed and licensed by the state or certified in some other way?  Where can I learn more about hospice? You can learn about existing hospice programs in your area from your health care providers. You can also read more about hospice online. The websites of the following organizations contain helpful information:  The [facility] and Palliative Care Organization [facility]).  The Hospice Association of America ([place]).  The [place].  The American Cancer Society (ACS).  Hospice Net.  This information is not intended to replace advice given to you by your health care provider. Make sure you discuss any questions you have with your health care provider. Document Released: 01/24/2004 Document Revised: 05/23/2016 Document Reviewed: 08/17/2013 Elsevier Interactive Patient Education  2017 [employer].

## 2017-12-10 NOTE — Progress Notes (Signed)
ANTICOAGULATION CONSULT NOTE - Initial Consult  Pharmacy Consult for heparin and warfarin Indication: mechanical mitral valve  Allergies  Allergen Reactions  . Codeine     REACTION: makes her "crazy"  . Penicillins Hives    Has patient had a PCN reaction causing immediate rash, facial/tongue/throat swelling, SOB or lightheadedness with hypotension: Yes Has patient had a PCN reaction causing severe rash involving mucus membranes or skin necrosis: Unknown Has patient had a PCN reaction that required hospitalization: No Has patient had a PCN reaction occurring within the last 10 years: No If all of the above answers are "NO", then may proceed with Cephalosporin use.   . Tramadol     Hallucinations    Patient Measurements: Height: 5' (152.4 cm) Weight: 114 lb (51.7 kg) IBW/kg (Calculated) : 45.5 Heparin Dosing Weight:   Vital Signs: Temp: 98.1 F (36.7 C) (02/20 1545) Temp Source: Oral (02/20 1545) BP: 138/61 (02/20 1545) Pulse Rate: 88 (02/20 1545)  Labs: Recent Labs    12/08/17 1315 12/08/17 1316 12/08/17 1848 12/09/17 0029 12/10/17 0522 12/10/17 0555 12/10/17 1706  HGB  --  10.1*  --   --  9.9*  --   --   HCT  --  32.2*  --   --  31.2*  --   --   PLT  --  269  --   --  247  --   --   APTT  --   --   --   --   --  33  --   LABPROT 33.2*  --   --   --  22.8*  --   --   INR 3.29  --   --   --  2.03  --   --   HEPARINUNFRC  --   --   --   --   --   --  0.70  CREATININE  --  0.98  --   --  0.90  --   --   TROPONINI  --  0.07* 0.07* 0.07*  --   --   --     Estimated Creatinine Clearance: 29.8 mL/min (by C-G formula based on SCr of 0.9 mg/dL).   Medical History: Past Medical History:  Diagnosis Date  . Chronic systolic CHF (congestive heart failure) (HCC)   . Coronary artery disease   . Diabetes mellitus without complication (HCC)   . Hyperlipidemia   . Hypertension   . Mitral valve disease   . NSTEMI (non-ST elevated myocardial infarction) (HCC) 01/15/2014     Medications:  Infusions:  . heparin 750 Units/hr (12/10/17 0843)    Assessment: 89 yof cc CP/SOB. Elevated troponin d/t demand ischemia, LVEF 40 to 45%. Cardiology recommends no invasive work up. Today INR was noted to be less than 2.5 so cardiology consulted pharmacy to dose UFH while INR re-equilibrates (dose on 12/08/17 ordered but not documented).   PTA Warfarin schedule is listed a 3 mg po Monday, Wednesday, Friday, Sunday and 6 mg po Tuesday, Thursday, and Saturday.  DATE INR DOSE 2/18 3.8 Not documented 2/19 3.29 6 mg 2/20 2.03 6 mg  Goal of Therapy:  Heparin level 0.3-0.7 units/ml Monitor platelets by anticoagulation protocol: Yes   Plan:  Give 3000 units bolus x 1 Start heparin infusion at 750 units/hr Check anti-Xa level in 8 hours and daily while on heparin Continue to monitor H&H and platelets   Will also increase today's warfarin dose from 3 mg po on Wednesdays to 6 mg  po on Wednesdays.   12/10/17 1706 heparin level therapeutic x 1. Continue current rate. Will order confirmation level in 8 hours.  Carola FrostNathan A Sherese Heyward, Pharm.D., BCPS Clinical Pharmacist 12/10/2017,5:54 PM

## 2017-12-10 NOTE — Progress Notes (Signed)
Progress Note  Patient Name: Amanda [name] Date of Encounter: 12/10/2017  Primary Cardiologist: Mariah Milling  Subjective   No further chest pain. No SOB. Continues to note RLE weakness. Echo stable EF of 45-50%. INR 2.03 this morning. HGB low, though stable.   Inpatient Medications    Scheduled Meds: . aspirin EC  81 mg Oral Daily  . benazepril  20 mg Oral Daily  . cholecalciferol  5,000 Units Oral Daily  . ezetimibe  10 mg Oral Daily  . heparin  3,000 Units Intravenous Once  . insulin aspart  4 Units Subcutaneous TID WC  . insulin glargine  25 Units Subcutaneous Daily  . isosorbide mononitrate  15 mg Oral Daily  . levothyroxine  25 mcg Oral QAC breakfast  . liraglutide  1.8 mg Subcutaneous QHS  . loratadine  10 mg Oral Daily  . metoprolol succinate  75 mg Oral Daily  . pantoprazole  40 mg Oral BID AC  . polyethylene glycol  17 g Oral Daily  . protein supplement shake  2 oz Oral BID BM  . rosuvastatin  40 mg Oral q1800  . warfarin  6 mg Oral Q Wed-1800   And  . [START ON 12/14/2017] warfarin  3 mg Oral Q Sun-1800   And  . [START ON 12/15/2017] warfarin  3 mg Oral Q Mon-1800   And  . [START ON 12/16/2017] warfarin  6 mg Oral Q Tue-1800   And  . [START ON 12/11/2017] warfarin  6 mg Oral Q Thu-1800   And  . [START ON 12/12/2017] warfarin  3 mg Oral Q Fri-1800   And  . [START ON 12/13/2017] warfarin  6 mg Oral Q Sat-1800  . Warfarin - Physician Dosing Inpatient   Does not apply q1800   Continuous Infusions: . heparin     PRN Meds: acetaminophen, furosemide, gi cocktail, guaiFENesin, ketorolac, morphine injection, nitroGLYCERIN, ondansetron (ZOFRAN) IV, potassium chloride   Vital Signs    Vitals:   12/09/17 1714 12/09/17 1935 12/10/17 0245 12/10/17 0753  BP: (!) 125/49 (!) 129/46 (!) 106/53 (!) 141/58  Pulse: 85 87 88 84  Resp: 20 17 18 16   Temp: 98 F (36.7 C) 97.8 F (36.6 C) 98.3 F (36.8 C) 98.3 F (36.8 C)  TempSrc: Oral Oral    SpO2: 93% 94% 94% 99%    Weight:      Height:        Intake/Output Summary (Last 24 hours) at 12/10/2017 0830 Last data filed at 12/09/2017 1900 Gross per 24 hour  Intake 600 ml  Output 200 ml  Net 400 ml   Filed Weights   12/08/17 1314  Weight: 114 lb (51.7 kg)    Telemetry    NSR - Personally Reviewed  ECG    n/a - Personally Reviewed  Physical Exam   GEN: Elderly and frail appearing; No acute distress.   Neck: No JVD. Cardiac: RRR, II/VI systolic murmur, no rubs, or gallops.  Respiratory: Clear to auscultation bilaterally.  GI: Soft, nontender, non-distended.   MS: No edema; No deformity. Neuro:  Alert and oriented x 3; Nonfocal.  Psych: Normal affect.  Labs    Chemistry Recent Labs  Lab 12/08/17 1316 12/10/17 0522  NA 137 139  K 4.2 4.1  CL 101 104  CO2 28 29  GLUCOSE 157* 136*  BUN 42* 39*  CREATININE 0.98 0.90  CALCIUM 9.6 10.0  GFRNONAA 49* 55*  GFRAA 57* >60  ANIONGAP 8 6  Hematology Recent Labs  Lab 12/08/17 1316 12/10/17 0522  WBC 5.1 5.6  RBC 4.07 3.99  HGB 10.1* 9.9*  HCT 32.2* 31.2*  MCV 79.2* 78.2*  MCH 24.7* 24.7*  MCHC 31.2* 31.6*  RDW 16.6* 16.5*  PLT 269 247    Cardiac Enzymes Recent Labs  Lab 12/08/17 1316 12/08/17 1848 12/09/17 0029  TROPONINI 0.07* 0.07* 0.07*   No results for input(s): TROPIPOC in the last 168 hours.   BNPNo results for input(s): BNP, PROBNP in the last 168 hours.   DDimer No results for input(s): DDIMER in the last 168 hours.   Radiology    Dg Chest 2 View  Result Date: 12/08/2017 IMPRESSION: No evidence of acute cardiopulmonary disease. Electronically Signed   By: Charline BillsSriyesh  Krishnan M.D.   On: 12/08/2017 14:38   Ct Head Wo Contrast  Result Date: 12/09/2017 IMPRESSION: 1. No acute intracranial pathology. 2. Chronic microvascular disease and cerebral atrophy. Electronically Signed   By: Elige KoHetal  Patel   On: 12/09/2017 11:20   Ct Lumbar Spine Wo Contrast  Result Date: 12/09/2017 IMPRESSION: Moderate to marked  multifactorial stenosis at the L4-5 level that could cause neural compressive symptoms. Mild to moderate multifactorial stenosis at L3-4 that could also possibly be symptomatic. Old partial compression fractures at T11, T12, L1 and L4. Since the previous imaging study of 10/08/2016, there is a newly seen superior endplate fracture at L3 with loss of height of only about 20% and mild posterior bowing of the posterosuperior vertebral body but no apparent compressive stenosis. I cannot tell with certainty if this is healed or more recent, only that the deformity is new since 2017. Electronically Signed   By: Paulina FusiMark  Shogry M.D.   On: 12/09/2017 15:05    Cardiac Studies   TTE 12/09/2017: Study Conclusions  - Left ventricle: The cavity size was normal. Wall thickness was   increased increased in a pattern of mild to moderate LVH.   Systolic function was mildly reduced. The estimated ejection   fraction was in the range of 45% to 50%. Regional wall motion   abnormalities cannot be excluded. The study is not technically   sufficient to allow evaluation of LV diastolic function. - Aortic valve: There was moderate regurgitation. - Mitral valve: A mechanical prosthesis was present. There was at   least mild regurgitation. Mean gradient (D): 4 mm Hg. Valve area   by continuity equation (using LVOT flow): 1.81 cm^2. - Left atrium: The atrium was mildly dilated. - Right ventricle: The cavity size was normal. Systolic function   was mildly reduced.  Patient Profile     84 y.o. female with history of CAD s/p CABG in 2004, mechanical mitral valve replacement on Coumadin with goal INR 2.5-3.5, chronic systolic CHF secondary to ischemic cardiomyopathy, chronic SOB, HTN, HLD, microcytic anemia, PAD, esophageal stricture s/p prior dilatation, and mostly wheelchair-bound who is being seen today for the evaluation of elevated troponin.  Assessment & Plan    1. Elevated troponin: -Minimally elevated and flat  trending with a peak of 0.07 -Not consistent with a NSTEMI -Chest pain free currently -Echo as above -Discussed with patient and given she is asymptomatic and in the setting of possible neurological deficit, we will defer invasive ischemic evaluation at this time -Follow up with primary cardiologist as an outpatient   2. CAD of native coronary arteries and bypass graft with chest pain at moderate risk for cardiac etiology: -As above -Continue ASA, Toprol, Crestor  3. Right lower extremity  weakness: -Concerning for possible neurological process -Neuro workup unrevelaing  4. Status post mechanical mitral valve: -Echo as above -Given INR dropped below 2.5, start heparin gtt -INR per pharmacy  -ASA  5. Chronic systolic CHF/ICM/SOB: -Stable for the past 12 months -She does not appear grossly volume overloaded -Suspect this is multifactorial including chronic CHF, advanced age with physical deconditioning and frail state as well as anemia -Echo as above -Continue Toprol and benazepril  -Will hold off on adding spironolactone at this time given advanced age and frail state -TSH normal  6. Microcytic anemia: -HGB appears stable -Per IM    For questions or updates, please contact [facility] Please consult [email or web address] for contact info under Cardiology/STEMI.    Signed, Eula Listen, PA-C [facility] Pager: [phone] 12/10/2017, 8:30 AM

## 2017-12-13 NOTE — Discharge Summary (Signed)
[facility] [place] at [facility]   PATIENT NAME: Amanda [name]    MR#:  [id number]  DATE OF BIRTH:  [birth year]  DATE OF ADMISSION:  12/08/2017   ADMITTING PHYSICIAN: Bertrum Sol, MD  DATE OF DISCHARGE: 12/10/2017  7:45 PM  PRIMARY CARE PHYSICIAN: Tenna Delaine, FNP   ADMISSION DIAGNOSIS:  Elevated troponin I level [R74.8] Chest pain, unspecified type [R07.9] DISCHARGE DIAGNOSIS:  Active Problems:   Angina pectoris syndrome (HCC)  SECONDARY DIAGNOSIS:   Past Medical History:  Diagnosis Date  . Chronic systolic CHF (congestive heart failure) (HCC)   . Coronary artery disease   . Diabetes mellitus without complication (HCC)   . Hyperlipidemia   . Hypertension   . Mitral valve disease   . NSTEMI (non-ST elevated myocardial infarction) (HCC) 01/15/2014   HOSPITAL COURSE:  Amanda [name] is a 84 y.o. female with multiple medical problems admitted for chest pain located in her mid chest lasting 15-30 minutes, associated with shortness of breath, better with second dose of nitroglycerin, brought via EMS, in the emergency room troponin 0.07, EKG with right bundle branch block/ST segment depression laterally, chest x-ray negative and was admitted for further eval and management.  1.Elevated troponin: -due to demand ischemia -no invasive work up per cardio which is very appropriate and family in agreement  2chronic systolic congestive heart failure without exacerbation most recent echocardiogram noted for ejection fraction 40-45%  3chronic diabetes mellitus type 2, controlled  4chronic benign essential hypertension  5chronic hyperlipidemia  6chronic artificial heart valve Continue Coumadin  7. CAD  8 Right lower extremity weakness:  - started approximately 2 days ago after arriving to the hospital. States she stood up at some point and her right leg felt weak. Also complains of chronic back pain that mostly presents when  ambulating and has resulted in having to use a wheelchair. Pain resolves when laying down and sitting.  - denies bladder/bowel dysfunction, saddle paresthesia, back pain, lower extremity pain/radiation, left lower extremity weakness.  - CT head neg - CT of the lumbar spine shows multifactorial stenosis at multiple levels at multiple levels including L4-L5 and L3-L4.  There are also multiple old partial compression fractures at T11 T12-L1 and L4 with a somewhat new endplate fracture at L3. Neurosurgery also seen & surgical intervention not recommended at this time; If back pain persists, may consider LSO brace for comfort measures. - Although there may be some contribution of her lumbar pathology that is leading to her right lower extremity complaints it is still no way to unequivocally rule out the possibility of an intracerebral lesion as well.  At this point patient is on maximal medical therapy for stroke prevention.  Has spoken with the family they do not want any aggressive treatment for her low back pathology.  Patient is mostly wheelchair-bound at this point.  - Plan is to return home with hospice. DISCHARGE CONDITIONS:  stable CONSULTS OBTAINED:  Treatment Team:  Yvonne Kendall, MD Thana Farr, MD Venetia Night, MD DRUG ALLERGIES:   Allergies  Allergen Reactions  . Codeine     REACTION: makes her "crazy"  . Penicillins Hives    Has patient had a PCN reaction causing immediate rash, facial/tongue/throat swelling, SOB or lightheadedness with hypotension: Yes Has patient had a PCN reaction causing severe rash involving mucus membranes or skin necrosis: Unknown Has patient had a PCN reaction that required hospitalization: No Has patient had a PCN reaction occurring within the last 10 years: No  If all of the above answers are "NO", then may proceed with Cephalosporin use.   . Tramadol     Hallucinations   DISCHARGE MEDICATIONS:   Allergies as of 12/10/2017      Reactions    Codeine    REACTION: makes her "crazy"   Penicillins Hives   Has patient had a PCN reaction causing immediate rash, facial/tongue/throat swelling, SOB or lightheadedness with hypotension: Yes Has patient had a PCN reaction causing severe rash involving mucus membranes or skin necrosis: Unknown Has patient had a PCN reaction that required hospitalization: No Has patient had a PCN reaction occurring within the last 10 years: No If all of the above answers are "NO", then may proceed with Cephalosporin use.   Tramadol    Hallucinations      Medication List    STOP taking these medications   benazepril 20 MG tablet Commonly known as:  LOTENSIN   D-3-5 5000 units capsule Generic drug:  Cholecalciferol   enoxaparin 60 MG/0.6ML injection Commonly known as:  LOVENOX   ezetimibe 10 MG tablet Commonly known as:  ZETIA   furosemide 20 MG tablet Commonly known as:  LASIX   guaiFENesin 600 MG 12 hr tablet Commonly known as:  MUCINEX   insulin lispro 100 UNIT/ML injection Commonly known as:  HUMALOG   LANTUS SOLOSTAR 100 UNIT/ML Solostar Pen Generic drug:  Insulin Glargine   loratadine 10 MG tablet Commonly known as:  CLARITIN   omeprazole 20 MG tablet Commonly known as:  PRILOSEC OTC   polyethylene glycol powder powder Commonly known as:  GLYCOLAX/MIRALAX   potassium chloride 10 MEQ tablet Commonly known as:  K-DUR   rosuvastatin 40 MG tablet Commonly known as:  CRESTOR   VICTOZA 18 MG/3ML Sopn Generic drug:  liraglutide     TAKE these medications   aspirin 81 MG EC tablet Take 81 mg by mouth daily.   feeding supplement (ENSURE ENLIVE) Liqd Take 237 mLs by mouth 2 (two) times daily between meals.   isosorbide mononitrate 30 MG 24 hr tablet Commonly known as:  IMDUR TAKE 1/2 TABLET BY MOUTH DAILY   ketorolac 10 MG tablet Commonly known as:  TORADOL Take 10 mg by mouth every 6 (six) hours as needed.   levothyroxine 25 MCG tablet Commonly known as:   SYNTHROID, LEVOTHROID Take 25 mcg by mouth daily before breakfast.   LORazepam 0.5 MG tablet Commonly known as:  ATIVAN Take 2 tablets (1 mg total) by mouth every 8 (eight) hours as needed for anxiety.   metoprolol succinate 50 MG 24 hr tablet Commonly known as:  TOPROL-XL TAKE 1 1/2 TABLETS BY MOUTH DAILY. TAKE WITH OR IMMEDIATELY FOLLOWING A MEAL   morphine 15 MG tablet Commonly known as:  MSIR Take 1 tablet (15 mg total) by mouth every 4 (four) hours as needed for severe pain.   nitroGLYCERIN 0.4 MG SL tablet Commonly known as:  NITROSTAT Place 1 tablet (0.4 mg total) under the tongue every 5 (five) minutes as needed for chest pain.   warfarin 6 MG tablet Commonly known as:  COUMADIN Take as directed. If you are unsure how to take this medication, talk to your nurse or doctor. Original instructions:  TAKE AS DIRECTED BY COUMADIN CLINIC What changed:  additional instructions        DISCHARGE INSTRUCTIONS:  If back pain persists, may consider LSO brace for comfort measures. DIET:  Regular diet DISCHARGE CONDITION:  Good ACTIVITY:  Activity as tolerated OXYGEN:  Home  Oxygen: No.  Oxygen Delivery: room air DISCHARGE LOCATION:  home with Hospice  If you experience worsening of your admission symptoms, develop shortness of breath, life threatening emergency, suicidal or homicidal thoughts you must seek medical attention immediately by calling 911 or calling your MD immediately  if symptoms less severe.  You Must read complete instructions/literature along with all the possible adverse reactions/side effects for all the Medicines you take and that have been prescribed to you. Take any new Medicines after you have completely understood and accpet all the possible adverse reactions/side effects.   Please note  You were cared for by a hospitalist during your hospital stay. If you have any questions about your discharge medications or the care you received while you were in  the hospital after you are discharged, you can call the unit and asked to speak with the hospitalist on call if the hospitalist that took care of you is not available. Once you are discharged, your primary care physician will handle any further medical issues. Please note that NO REFILLS for any discharge medications will be authorized once you are discharged, as it is imperative that you return to your primary care physician (or establish a relationship with a primary care physician if you do not have one) for your aftercare needs so that they can reassess your need for medications and monitor your lab values.    On the day of Discharge:  VITAL SIGNS:  Blood pressure 138/61, pulse 88, temperature 98.1 F (36.7 C), temperature source Oral, resp. rate 17, height 5' (1.524 m), weight 51.7 kg (114 lb), SpO2 98 %. PHYSICAL EXAMINATION:  GENERAL:  84 y.o.-year-old patient lying in the bed with no acute distress.  EYES: Pupils equal, round, reactive to light and accommodation. No scleral icterus. Extraocular muscles intact.  HEENT: Head atraumatic, normocephalic. Oropharynx and nasopharynx clear.  NECK:  Supple, no jugular venous distention. No thyroid enlargement, no tenderness.  LUNGS: Normal breath sounds bilaterally, no wheezing, rales,rhonchi or crepitation. No use of accessory muscles of respiration.  CARDIOVASCULAR: S1, S2 normal. No murmurs, rubs, or gallops.  ABDOMEN: Soft, non-tender, non-distended. Bowel sounds present. No organomegaly or mass.  EXTREMITIES: No pedal edema, cyanosis, or clubbing.  NEUROLOGIC: Cranial nerves II through XII are intact. Muscle strength 5/5 in all extremities. Sensation intact. Gait not checked.  PSYCHIATRIC: The patient is alert and oriented x 3.  SKIN: No obvious rash, lesion, or ulcer.  DATA REVIEW:   CBC Recent Labs  Lab 12/10/17 0522  WBC 5.6  HGB 9.9*  HCT 31.2*  PLT 247    Chemistries  Recent Labs  Lab 12/10/17 0522  NA 139  K 4.1  CL  104  CO2 29  GLUCOSE 136*  BUN 39*  CREATININE 0.90  CALCIUM 10.0     Follow-up Information    Tenna DelaineJulich, Cynthia C, FNP. Schedule an appointment as soon as possible for a visit in 1 week(s).   Specialty:  Family Medicine Contact information: [address] [place] [id number] [phone]             Management plans discussed with the patient, family and they are in agreement.  CODE STATUS: Prior   TOTAL TIME TAKING CARE OF THIS PATIENT: 45 minutes.    Delfino LovettVipul Stevin Bielinski M.D on 12/13/2017 at 8:49 AM  Between 7am to 6pm - Pager - [phone]  After 6pm go to [email or web address] - Therapist, nutritionalpassword EPAS [facility]  [facility] [place] [facility]  Office  [phone]  CC: Primary care  physician; Tenna Delaine, FNP   Note: This dictation was prepared with Dragon dictation along with smaller phrase technology. Any transcriptional errors that result from this process are unintentional.

## 2017-12-31 ENCOUNTER — Other Ambulatory Visit: Payer: Self-pay | Admitting: Cardiovascular Disease

## 2017-12-31 NOTE — Telephone Encounter (Signed)
Please advise if ok to refill Potassium 10 meq. Pt was in hospital 2/19 discharge mentioned to stop potassium on medlist.

## 2018-01-14 ENCOUNTER — Ambulatory Visit (INDEPENDENT_AMBULATORY_CARE_PROVIDER_SITE_OTHER): Payer: Medicare Other

## 2018-01-14 ENCOUNTER — Telehealth: Payer: Self-pay | Admitting: Physician Assistant

## 2018-01-14 DIAGNOSIS — I059 Rheumatic mitral valve disease, unspecified: Secondary | ICD-10-CM

## 2018-01-14 DIAGNOSIS — Z5181 Encounter for therapeutic drug level monitoring: Secondary | ICD-10-CM

## 2018-01-14 DIAGNOSIS — Z9889 Other specified postprocedural states: Secondary | ICD-10-CM

## 2018-01-14 LAB — POCT INR: INR: 1.6

## 2018-01-14 NOTE — Telephone Encounter (Signed)
Please see anti-coag note for today. 

## 2018-01-14 NOTE — Patient Instructions (Addendum)
Spoke w/ pt's POA, Kay.  Advised her to have pt take 1 tablet today, tomorrow & Friday, then resume dosage of 1/2 tablet every day, except 1 tablet on Tuesdays, Thursdays & Saturdays.    PLEASE BE CONSISTENT WITH YOUR GREEN INTAKE. Recheck in 1 week - pt is under home Hospice care w/ weekly visits from nurse. Provided her w/ direct #s to call coumadin clinic w/ readings, as she visits on Tuesdays for the time being.   [phone], spoke w/ Pam and gave verbal order for weekly INR checks. Asked to call my direct # on Mondays or Wednesdays or to call [place] office if done on Tuesdays.

## 2018-01-14 NOTE — Telephone Encounter (Signed)
Pt POA, niece, called states when INR was checked yesterday by hospice it was 1.6, she would like advice on what to do. Please call

## 2018-01-20 ENCOUNTER — Ambulatory Visit (INDEPENDENT_AMBULATORY_CARE_PROVIDER_SITE_OTHER): Payer: Medicare Other | Admitting: Pharmacist

## 2018-01-20 DIAGNOSIS — Z9889 Other specified postprocedural states: Secondary | ICD-10-CM

## 2018-01-20 DIAGNOSIS — Z5181 Encounter for therapeutic drug level monitoring: Secondary | ICD-10-CM

## 2018-01-20 DIAGNOSIS — I059 Rheumatic mitral valve disease, unspecified: Secondary | ICD-10-CM | POA: Diagnosis not present

## 2018-01-20 LAB — POCT INR: INR: 1.6

## 2018-01-27 ENCOUNTER — Ambulatory Visit (INDEPENDENT_AMBULATORY_CARE_PROVIDER_SITE_OTHER): Payer: Medicare Other

## 2018-01-27 DIAGNOSIS — Z9889 Other specified postprocedural states: Secondary | ICD-10-CM

## 2018-01-27 DIAGNOSIS — Z5181 Encounter for therapeutic drug level monitoring: Secondary | ICD-10-CM

## 2018-01-27 DIAGNOSIS — I059 Rheumatic mitral valve disease, unspecified: Secondary | ICD-10-CM | POA: Diagnosis not present

## 2018-01-27 LAB — POCT INR: INR: 1.7

## 2018-01-27 NOTE — Patient Instructions (Signed)
Description   Spoke w/ Hospice RN Kennyth ArnoldStacy and advised pt to take 1.5 tablets today, then start taking 1 tablet every day, except 1/2 tablet on Mondays, Wednesdays, and Fridays. Kennyth ArnoldStacy will call patient's daughter Joyce GrossKay to relay dosing. Recheck in 1 week - pt is under home Hospice care w/ weekly visits from nurse. Provided her w/ direct #s to call coumadin clinic w/ readings, as she visits on Tuesdays for the time being.

## 2018-02-03 ENCOUNTER — Ambulatory Visit (INDEPENDENT_AMBULATORY_CARE_PROVIDER_SITE_OTHER): Payer: Medicare Other

## 2018-02-03 DIAGNOSIS — Z5181 Encounter for therapeutic drug level monitoring: Secondary | ICD-10-CM

## 2018-02-03 DIAGNOSIS — I059 Rheumatic mitral valve disease, unspecified: Secondary | ICD-10-CM | POA: Diagnosis not present

## 2018-02-03 DIAGNOSIS — Z9889 Other specified postprocedural states: Secondary | ICD-10-CM | POA: Diagnosis not present

## 2018-02-03 LAB — POCT INR: INR: 1.8

## 2018-02-03 NOTE — Patient Instructions (Signed)
Description   Spoke w/ Hospice RN Kennyth ArnoldStacy and advised pt to take 1.5 tablets today, then start taking 1 tablet every day, except 1/2 tablet on Mondays and Fridays. Kennyth ArnoldStacy will call patient's daughter Joyce GrossKay to relay dosing. Recheck in 1 week - pt is under home Hospice care w/ weekly visits from nurse. Provided her w/ direct #s to call coumadin clinic w/ readings, as she visits on Tuesdays for the time being.

## 2018-02-10 ENCOUNTER — Ambulatory Visit (INDEPENDENT_AMBULATORY_CARE_PROVIDER_SITE_OTHER): Payer: Medicare Other | Admitting: Cardiology

## 2018-02-10 ENCOUNTER — Telehealth: Payer: Self-pay | Admitting: Cardiovascular Disease

## 2018-02-10 DIAGNOSIS — I059 Rheumatic mitral valve disease, unspecified: Secondary | ICD-10-CM

## 2018-02-10 DIAGNOSIS — Z5181 Encounter for therapeutic drug level monitoring: Secondary | ICD-10-CM

## 2018-02-10 DIAGNOSIS — Z9889 Other specified postprocedural states: Secondary | ICD-10-CM

## 2018-02-10 LAB — POCT INR: INR: 2

## 2018-02-10 NOTE — Telephone Encounter (Signed)
Nurse states pt INR is 2.0. Needs orders.

## 2018-02-10 NOTE — Telephone Encounter (Signed)
Please refer to Anticoagulation Encounter. Thanks.  

## 2018-02-17 ENCOUNTER — Ambulatory Visit (INDEPENDENT_AMBULATORY_CARE_PROVIDER_SITE_OTHER): Payer: Medicare Other | Admitting: Cardiology

## 2018-02-17 DIAGNOSIS — Z5181 Encounter for therapeutic drug level monitoring: Secondary | ICD-10-CM

## 2018-02-17 DIAGNOSIS — I059 Rheumatic mitral valve disease, unspecified: Secondary | ICD-10-CM | POA: Diagnosis not present

## 2018-02-17 DIAGNOSIS — Z9889 Other specified postprocedural states: Secondary | ICD-10-CM

## 2018-02-17 LAB — POCT INR: INR: 2.5

## 2018-02-25 ENCOUNTER — Encounter: Payer: Self-pay | Admitting: Cardiovascular Disease

## 2018-02-25 DIAGNOSIS — Z5181 Encounter for therapeutic drug level monitoring: Secondary | ICD-10-CM

## 2018-02-25 DIAGNOSIS — Z9889 Other specified postprocedural states: Secondary | ICD-10-CM

## 2018-02-25 DIAGNOSIS — I059 Rheumatic mitral valve disease, unspecified: Secondary | ICD-10-CM

## 2018-02-25 NOTE — Progress Notes (Signed)
This encounter was created in error - please disregard.

## 2018-03-03 ENCOUNTER — Ambulatory Visit (INDEPENDENT_AMBULATORY_CARE_PROVIDER_SITE_OTHER): Payer: Medicare Other

## 2018-03-03 DIAGNOSIS — Z5181 Encounter for therapeutic drug level monitoring: Secondary | ICD-10-CM

## 2018-03-03 DIAGNOSIS — I059 Rheumatic mitral valve disease, unspecified: Secondary | ICD-10-CM | POA: Diagnosis not present

## 2018-03-03 DIAGNOSIS — Z7189 Other specified counseling: Secondary | ICD-10-CM

## 2018-03-03 DIAGNOSIS — Z9889 Other specified postprocedural states: Secondary | ICD-10-CM

## 2018-03-03 LAB — POCT INR: INR: 6.5

## 2018-03-04 NOTE — Patient Instructions (Signed)
Description   Spoke w/ Hospice RN Tracie Harrier while in pt's home and advised to draw STAT INR, as machine is not accurate over 6.0.  Pt has not taken today's dosage of Coumadin, will hold today and tomorrow's dosage of Coumadin and await STAT venipuncture results.  Go to ED with bleeding. Pt is under home Hospice care w/ weekly visits from nurse. 03/04/2018 Spoke with Crosby Oyster who does her medications and instructed to hold coumadin today May 15th and hold on May 16th  as well and she states she did not give her coumadin yesterday as instructed  then restart coumadin on May 17th at regular dose of  daily except  on Mondays and Fridays Recheck INR on Monday May 20th  Continue to monitor for any bleeding and go to ER if seen and try to get her to eat  some dark leafy greens or drink Boost or Ensure  extra today and tomorrow Will finish Prednisone on Thursday Spoke with Pat nurse with Hospice and gave above orders and she states understanding and will recheck INR on Monday

## 2018-03-13 ENCOUNTER — Telehealth: Payer: Self-pay | Admitting: *Deleted

## 2018-03-13 NOTE — Telephone Encounter (Signed)
Called Hospice RN because pt is overdue for INR recheck. They stated she is discharged from their service per niece as she revoked services.  Spoke with Crosby Oyster niece, regarding the pt in the hospital at [facility] and having swallowing issues and that the pt is still currently admitted. Also, she stated the pt's son died Mar 13, 2023 and the pt having a hard time. Pt is still is the hospital at [facility]. Lowella Fairy to call back once pt is discharged and she verbalized understanding.

## 2018-06-21 DEATH — deceased

## 2018-12-06 IMAGING — CR DG CHEST 2V
1 series · 2 of 2 positions shown · non-contrast
Comparison: CT chest dated 10/08/2016

CLINICAL DATA: Chest pain, confusion

EXAM:
CHEST  2 VIEW

[Series 1: dg chest 2 view · 0.14mm/px · 2 of 2 slices shown]
[im 1/2]
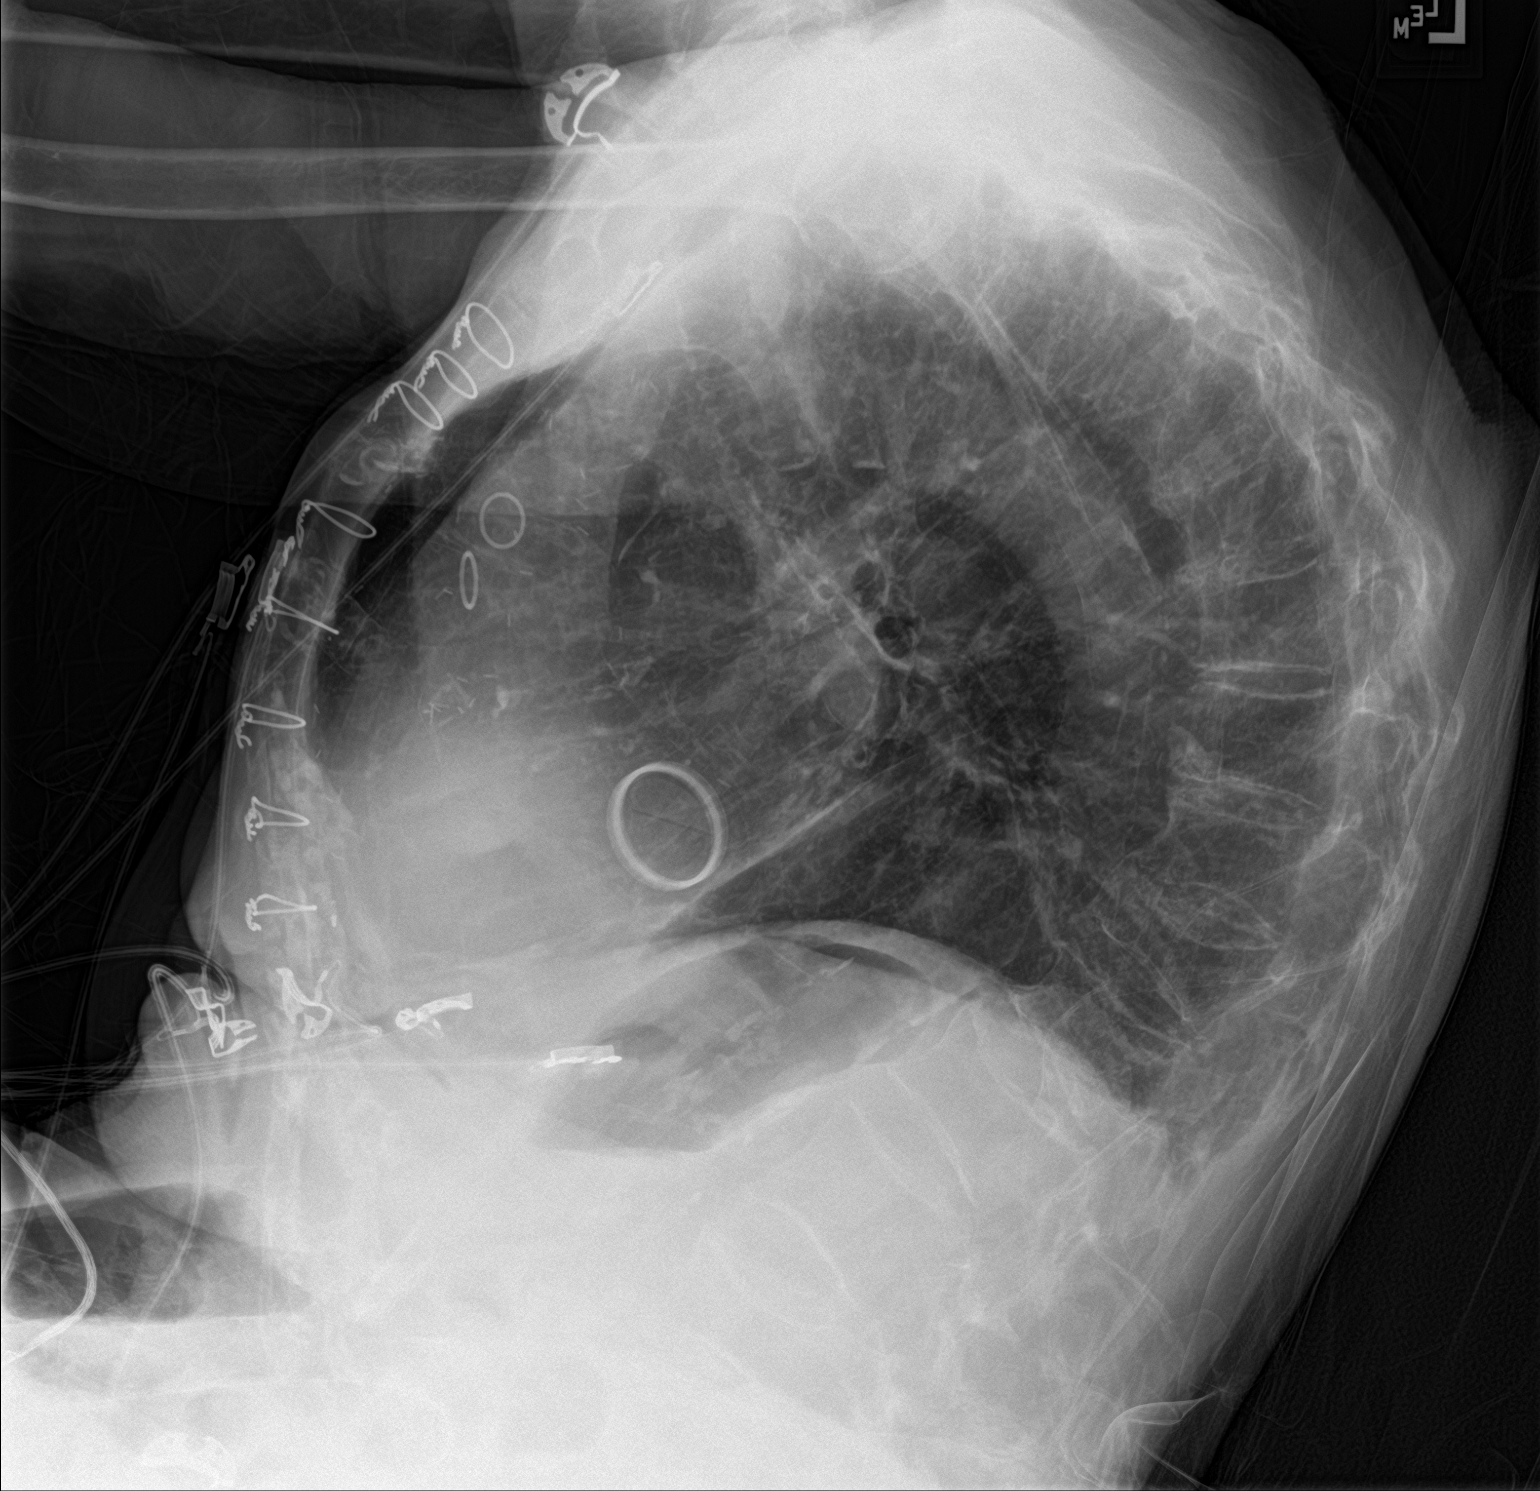
[im 2/2]
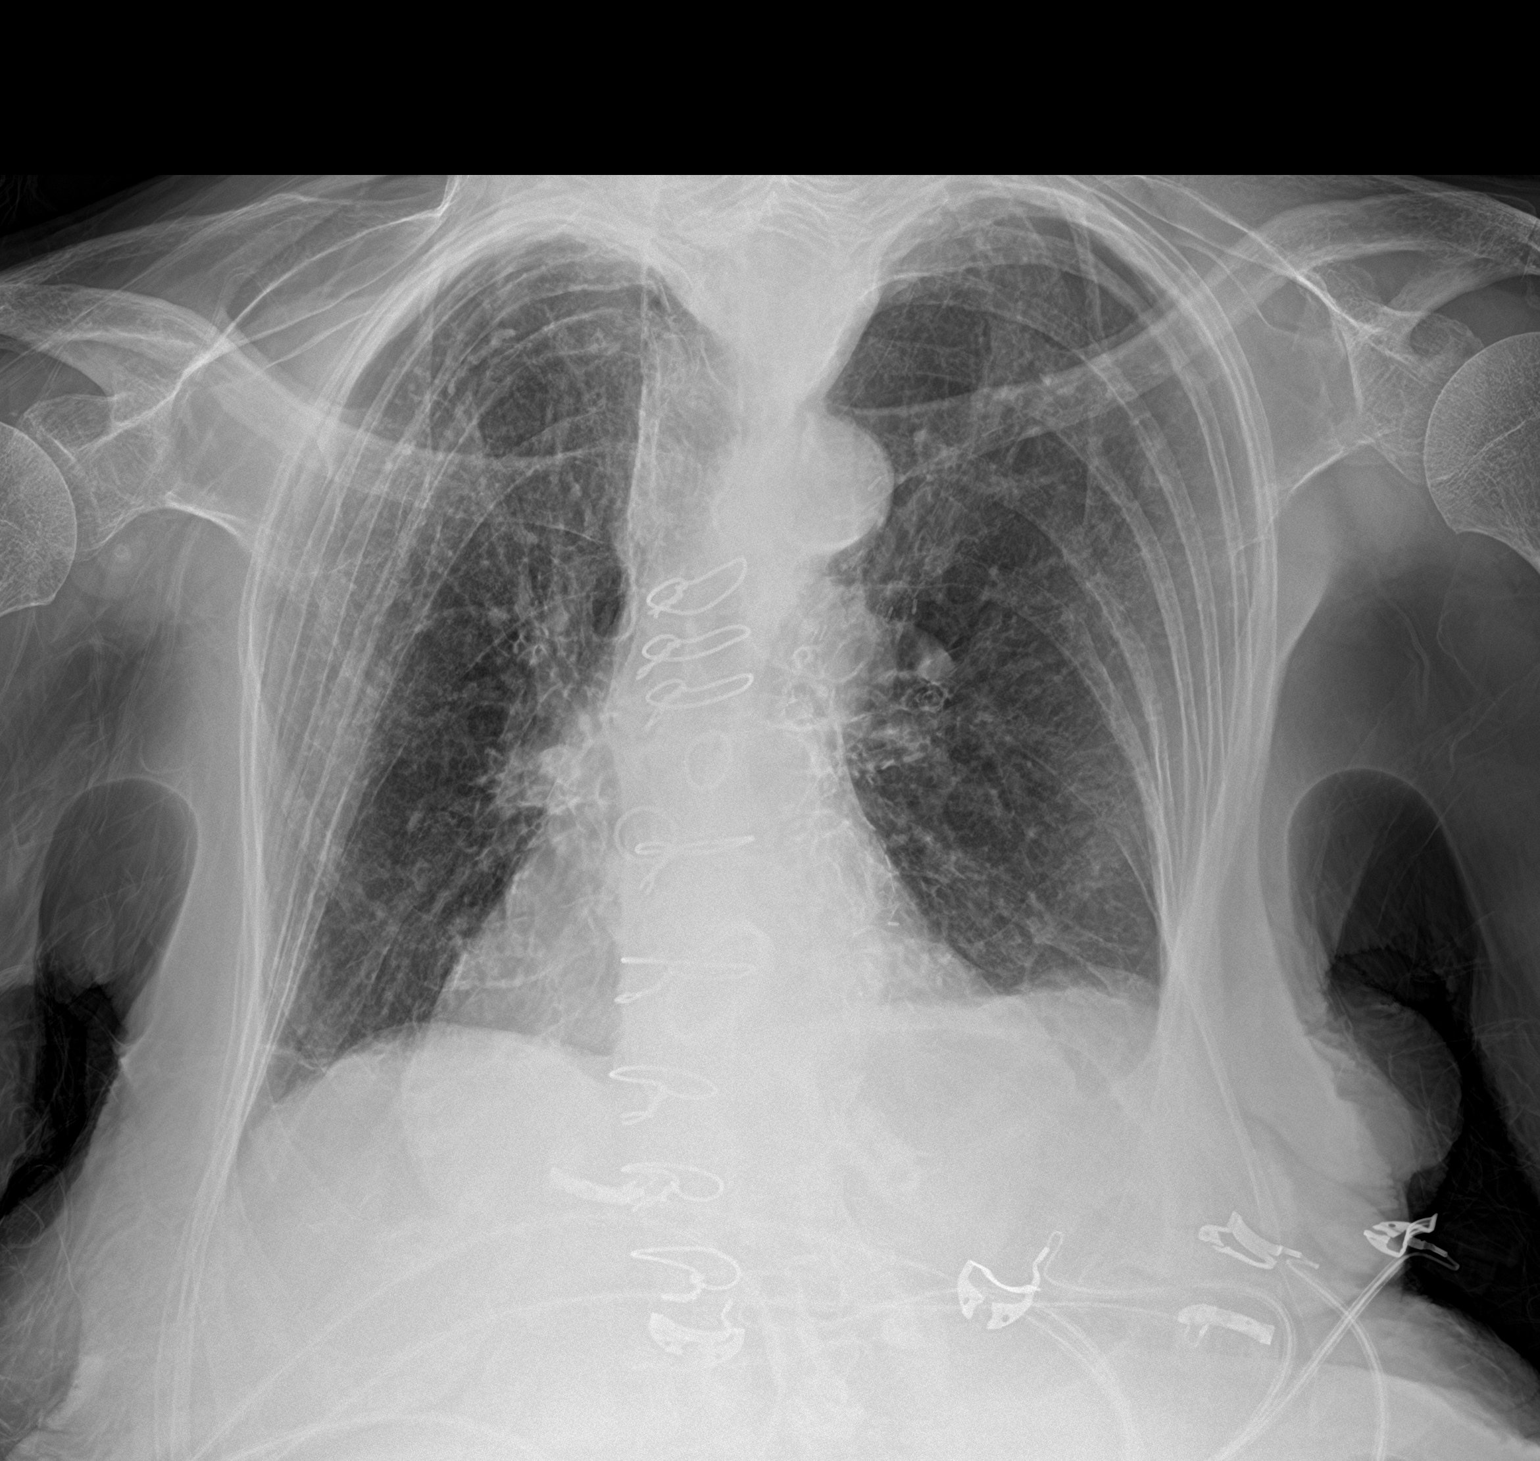

[2 of 2 positions shown; findings below may reference images not displayed]

FINDINGS: Lungs are essentially clear. No frank interstitial edema. No pleural
effusion or pneumothorax.

Cardiomegaly.  Postsurgical changes related to prior CABG.

Degenerative changes of the visualized thoracolumbar spine. Mild to
moderate compression fracture deformities of two midthoracic
vertebral bodies, chronic. Median sternotomy.
IMPRESSION: No evidence of acute cardiopulmonary disease.
